# Patient Record
Sex: Female | Born: 1940 | Race: White | Hispanic: No | Smoking: Never smoker
Health system: Southern US, Community
[De-identification: ages and names within clinical notes are randomized; demographics above are authoritative.]

## PROBLEM LIST (undated history)

## (undated) DIAGNOSIS — I2510 Atherosclerotic heart disease of native coronary artery without angina pectoris: Secondary | ICD-10-CM

## (undated) DIAGNOSIS — I10 Essential (primary) hypertension: Secondary | ICD-10-CM

## (undated) DIAGNOSIS — I447 Left bundle-branch block, unspecified: Secondary | ICD-10-CM

## (undated) DIAGNOSIS — R079 Chest pain, unspecified: Secondary | ICD-10-CM

## (undated) HISTORY — PX: VARICOSE VEIN SURGERY: SHX832

## (undated) HISTORY — DX: Atherosclerotic heart disease of native coronary artery without angina pectoris: I25.10

## (undated) HISTORY — PX: CHOLECYSTECTOMY: SHX55

## (undated) HISTORY — PX: ABDOMINAL HYSTERECTOMY: SHX81

## (undated) HISTORY — DX: Chest pain, unspecified: R07.9

## (undated) HISTORY — DX: Left bundle-branch block, unspecified: I44.7

---

## 2001-04-03 ENCOUNTER — Emergency Department (HOSPITAL_COMMUNITY): Admission: EM | Admit: 2001-04-03 | Discharge: 2001-04-03 | Payer: Self-pay | Admitting: Emergency Medicine

## 2003-08-24 ENCOUNTER — Ambulatory Visit (HOSPITAL_COMMUNITY): Admission: RE | Admit: 2003-08-24 | Discharge: 2003-08-24 | Payer: Self-pay | Admitting: Family Medicine

## 2007-02-08 ENCOUNTER — Ambulatory Visit: Payer: Self-pay | Admitting: Cardiology

## 2007-02-08 LAB — CONVERTED CEMR LAB
Basophils Absolute: 0 10*3/uL (ref 0.0–0.1)
Eosinophils Absolute: 0 10*3/uL (ref 0.0–0.6)
Eosinophils Relative: 0.4 % (ref 0.0–5.0)
GFR calc Af Amer: 81 mL/min
GFR calc non Af Amer: 67 mL/min
Glucose, Bld: 101 mg/dL — ABNORMAL HIGH (ref 70–99)
HCT: 35.8 % — ABNORMAL LOW (ref 36.0–46.0)
Lymphocytes Relative: 42.7 % (ref 12.0–46.0)
MCHC: 36 g/dL (ref 30.0–36.0)
MCV: 85.6 fL (ref 78.0–100.0)
Neutro Abs: 2.4 10*3/uL (ref 1.4–7.7)
Neutrophils Relative %: 48.7 % (ref 43.0–77.0)
Platelets: 158 10*3/uL (ref 150–400)
RBC: 4.18 M/uL (ref 3.87–5.11)
Sodium: 141 meq/L (ref 135–145)
WBC: 4.8 10*3/uL (ref 4.5–10.5)
aPTT: 25.6 s (ref 21.7–29.8)

## 2007-02-11 ENCOUNTER — Inpatient Hospital Stay (HOSPITAL_BASED_OUTPATIENT_CLINIC_OR_DEPARTMENT_OTHER): Admission: RE | Admit: 2007-02-11 | Discharge: 2007-02-11 | Payer: Self-pay | Admitting: Cardiovascular Disease

## 2007-02-11 ENCOUNTER — Ambulatory Visit: Payer: Self-pay | Admitting: Cardiovascular Disease

## 2007-03-03 ENCOUNTER — Ambulatory Visit: Payer: Self-pay | Admitting: Cardiology

## 2007-03-03 ENCOUNTER — Ambulatory Visit: Payer: Self-pay

## 2007-05-05 ENCOUNTER — Ambulatory Visit: Payer: Self-pay | Admitting: Cardiology

## 2007-12-11 ENCOUNTER — Inpatient Hospital Stay (HOSPITAL_COMMUNITY): Admission: EM | Admit: 2007-12-11 | Discharge: 2007-12-13 | Payer: Self-pay | Admitting: Emergency Medicine

## 2007-12-11 ENCOUNTER — Ambulatory Visit: Payer: Self-pay | Admitting: Cardiology

## 2008-07-04 ENCOUNTER — Ambulatory Visit: Payer: Self-pay | Admitting: Cardiology

## 2008-08-20 ENCOUNTER — Ambulatory Visit: Payer: Self-pay | Admitting: Cardiology

## 2008-08-20 LAB — CONVERTED CEMR LAB
ALT: 20 units/L (ref 0–35)
AST: 18 units/L (ref 0–37)
Alkaline Phosphatase: 37 units/L — ABNORMAL LOW (ref 39–117)
Bilirubin, Direct: 0.1 mg/dL (ref 0.0–0.3)
Total Bilirubin: 1 mg/dL (ref 0.3–1.2)
Total Protein: 6.9 g/dL (ref 6.0–8.3)

## 2008-10-31 ENCOUNTER — Encounter (INDEPENDENT_AMBULATORY_CARE_PROVIDER_SITE_OTHER): Payer: Self-pay | Admitting: *Deleted

## 2008-12-24 ENCOUNTER — Encounter: Payer: Self-pay | Admitting: Cardiology

## 2009-02-25 DIAGNOSIS — E785 Hyperlipidemia, unspecified: Secondary | ICD-10-CM

## 2009-02-25 DIAGNOSIS — R609 Edema, unspecified: Secondary | ICD-10-CM

## 2009-02-25 DIAGNOSIS — I10 Essential (primary) hypertension: Secondary | ICD-10-CM | POA: Insufficient documentation

## 2009-05-04 ENCOUNTER — Encounter: Payer: Self-pay | Admitting: Cardiology

## 2009-05-05 ENCOUNTER — Encounter: Payer: Self-pay | Admitting: Cardiology

## 2009-06-18 DIAGNOSIS — I2510 Atherosclerotic heart disease of native coronary artery without angina pectoris: Secondary | ICD-10-CM

## 2009-07-18 ENCOUNTER — Ambulatory Visit: Payer: Self-pay

## 2009-07-18 ENCOUNTER — Ambulatory Visit: Payer: Self-pay | Admitting: Cardiology

## 2009-07-18 DIAGNOSIS — I447 Left bundle-branch block, unspecified: Secondary | ICD-10-CM | POA: Insufficient documentation

## 2009-07-19 ENCOUNTER — Telehealth (INDEPENDENT_AMBULATORY_CARE_PROVIDER_SITE_OTHER): Payer: Self-pay | Admitting: *Deleted

## 2010-04-21 ENCOUNTER — Emergency Department (HOSPITAL_COMMUNITY): Admission: EM | Admit: 2010-04-21 | Discharge: 2010-04-21 | Payer: Self-pay | Admitting: Emergency Medicine

## 2010-07-08 ENCOUNTER — Ambulatory Visit: Admit: 2010-07-08 | Payer: Self-pay | Admitting: Cardiology

## 2010-07-17 NOTE — Assessment & Plan Note (Signed)
Summary: rov per pt daughter call-mb  Medications Added FUROSEMIDE 40 MG TABS (FUROSEMIDE) 1 tab two times a day METOPROLOL SUCCINATE 100 MG XR24H-TAB (METOPROLOL SUCCINATE) 1/2 tab at bedtime      Allergies Added: ! CODEINE  Visit Type:  rov Primary Provider:  Dr. [name].[facility]  CC:  edema/feet..pt states she had cellulitis 2 months ago..otherwise no other complaints today.  History of Present Illness: [name] returns today for evaluation and management of her nonobstructive coronary artery disease by cardiac catheterization in 2008, normal left ventricular systolic function, and chronic lower extremity edema which she's had for a long time.  A couple weeks ago she developed cellulitis in her left lower shin. She was admitted to [facility].  She's also had an episode where she developed overnight increased swelling of her right lower extremity here recently. Dr. [name] placed her on increased Lasix. It has gradually come down. She complained particularly of pain in the back part of her calf. It is improved. There she denies hemoptysis, pleuritic chest pain, or history of DVT. 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 Her risk is obviously increased.  I have pulled her lipid profile from March of 2010. Her lipids at that time showed total cholesterol 230, HDL 32.2, LDL of 137. Liver functions were normal. Since that time she is to start on pravastatin by her primary care physician. She has had followup blood work there which I do not have a copy of period  Current Medications (verified): 1)  Benicar 40 Mg Tabs (Olmesartan Medoxomil) .Marland Kitchen.. 1 Tab Once Daily 2)  Aspirin Ec 325 Mg Tbec (Aspirin) .... Take One Tablet By Mouth Daily 3)  Furosemide 40 Mg Tabs (Furosemide) .Marland Kitchen.. 1 Tab Two Times A Day 4)  Metoprolol Succinate 100 Mg Xr24h-Tab (Metoprolol Succinate) .... 1/2 Tab At Bedtime 5)  Pravastatin Sodium 40 Mg Tabs (Pravastatin Sodium)  .Marland Kitchen.. 1 Tab Once Daily 6)  Tylenol 325 Mg Tabs (Acetaminophen) .... As Needed  Allergies (verified): 1)  ! Codeine  Past History:  Past Medical History: Last updated: 06/18/2009 CAD, NATIVE VESSEL (ICD-414.01) HYPERTENSION, UNSPECIFIED (ICD-401.9) HYPERLIPIDEMIA (ICD-272.4) LEG EDEMA (ICD-782.3)  Past Surgical History: Last updated: 07/09/2008 S/P Vein stripping LLE Hysterectomy 1976 Cholecystectomy 2004  Family History: Last updated: 07/09/2008 Family History of Coronary Artery Disease:   Social History: Last updated: 07/09/2008 Full Time Widowed  Tobacco Use - No.  Alcohol Use - no Drug Use - no  Risk Factors: Smoking Status: never (07/09/2008)  Review of Systems       negative other than history of present illness  Vital Signs:  Patient profile:   70 year old female Height:      65 inches Weight:      194 pounds BMI:     32.40 Pulse rate:   62 / minute Pulse rhythm:   irregular BP sitting:   140 / 92  (left arm) Cuff size:   large  Vitals Entered By: [name], [name] (July 18, 2009 11:29 AM)  Physical Exam  General:  obese.   Head:  normocephalic and atraumatic Eyes:  PERRLA/EOM intact; conjunctiva and lids normal. Neck:  Neck supple, no JVD. No masses, thyromegaly or abnormal cervical nodes. Lungs:  clear without rub Heart:  regular rate and rhythm, soft paradoxic split S2, no evidence of right ventricular lift. Msk:  decreased ROM.   Pulses:  diminished in the lower extremities. Extremities:  chronic scarring of both  lower legs. She has 3+ pitting edema of the right lower extremity above the sock line and 2+ of the left lower extremity.her right lower extremity is not tender I do not feel a cord Neurologic:  Alert and oriented x 3. Skin:  Intact without lesions or rashes. Psych:  Normal affect.   Problems:  Medical Problems Added: 1)  Dx of Lbbb  (ICD-426.3)  Impression & Recommendations:  Problem # 1:  CAD, NATIVE VESSEL  (ICD-414.01) Assessment Unchanged  Her updated medication list for this problem includes:    Aspirin Ec 325 Mg Tbec (Aspirin) .Marland Kitchen... Take one tablet by mouth daily    Metoprolol Succinate 100 Mg Xr24h-tab (Metoprolol succinate) .Marland Kitchen... 1/2 tab at bedtime  Problem # 2:  LEG EDEMA (ICD-782.3) Assessment: Deteriorated  Her history of increased right lower shin edema overnight recently caused concern for possibility of a DVT. We need to ultrasound her legs today.  Orders: Venous Duplex Lower Extremity (Venous Duplex Lower)  Problem # 3:  LBBB (ICD-426.3) Assessment: Unchanged  Her updated medication list for this problem includes:    Aspirin Ec 325 Mg Tbec (Aspirin) .Marland Kitchen... Take one tablet by mouth daily    Metoprolol Succinate 100 Mg Xr24h-tab (Metoprolol succinate) .Marland Kitchen... 1/2 tab at bedtime  Orders: EKG w/ Interpretation (93000)  Problem # 4:  HYPERLIPIDEMIA (ICD-272.4) She is now on pravastatin and this is being followed by primary care. Her updated medication list for this problem includes:    Pravastatin Sodium 40 Mg Tabs (Pravastatin sodium) .Marland Kitchen... 1 tab once daily  Patient Instructions: 1)  Your physician recommends that you schedule a follow-up appointment in: 6 MONTHS WITH DR Latori Beggs 2)  Your physician recommends that you continue on your current medications as directed. Please refer to the Current Medication list given to you today. 3)  Your physician has requested that you have a lower or upper extremity venous duplex.  This test is an ultrasound of the veins in the legs or arms.  It looks at venous blood flow that carries blood from the heart to the legs or arms.  Allow one hour for a Lower Venous exam.  Allow thirty minutes for an Upper Venous exam. There are no restrictions or special instructions.TODAY R/O DVT

## 2010-07-17 NOTE — Progress Notes (Signed)
  Medications Added COUMADIN 5 MG TABS (WARFARIN SODIUM) AS DIRECTED       Phone Note Outgoing Call   Call placed by: Scherrie Bateman, LPN,  July 19, 2009 1:52 PM Call placed to: Patient Summary of Call: ATTEMPTED TO CALL PT TO START COUMADIN 5 MG WITH F/U IN CC AT MOST CONVENIENT LOCATION PER DR WALL . THROMBUS NOTED IN TH E IVC AND PELVIC AREA APPEARS TO BE OLD . NOTED AT 05/04/09  ADMISSION AT [facility].N/A X1 Initial call taken by: Scherrie Bateman, LPN,  July 19, 2009 1:55 PM  Follow-up for Phone Call        N/A X1 Scherrie Bateman, LPN  July 23, 2009 8:58 AM CALL PLACED TO PT AWARE OF MED CHANGE AND COUMADIN SENT THROUGH EMR CC APPT MADE FOR MON 07/29/09 AT 9:OO AM Follow-up by: Scherrie Bateman, LPN,  July 25, 2009 8:31 AM    New/Updated Medications: COUMADIN 5 MG TABS (WARFARIN SODIUM) AS DIRECTED Prescriptions: COUMADIN 5 MG TABS (WARFARIN SODIUM) AS DIRECTED  #30 x 3   Entered by:   Scherrie Bateman, LPN   Authorized by:   Gaylord Shih, MD, [facility]   Signed by:   Scherrie Bateman, LPN on 78/29/5621   Method used:   Electronically to        [facility]  [address] (retail)       [address]       [place], [place]  [address]       Ph: [phone]       Fax: [phone]   RxID:   [id number]

## 2010-07-17 NOTE — Letter (Signed)
Summary: [facility]   [facility]   Imported By: Roderic Ovens 07/24/2009 15:26:25  _____________________________________________________________________  External Attachment:    Type:   Image     Comment:   External Document

## 2010-08-26 LAB — COMPREHENSIVE METABOLIC PANEL
AST: 21 U/L (ref 0–37)
Albumin: 3.9 g/dL (ref 3.5–5.2)
Alkaline Phosphatase: 38 U/L — ABNORMAL LOW (ref 39–117)
CO2: 25 mEq/L (ref 19–32)
Chloride: 105 mEq/L (ref 96–112)
Creatinine, Ser: 1 mg/dL (ref 0.4–1.2)
GFR calc Af Amer: >60 mL/min (ref >60)
GFR calc non Af Amer: 55 mL/min — ABNORMAL LOW (ref >60)
Potassium: 3.6 mEq/L (ref 3.5–5.1)
Total Bilirubin: 0.9 mg/dL (ref 0.3–1.2)

## 2010-08-26 LAB — DIFFERENTIAL
Basophils Absolute: 0 10*3/uL (ref 0.0–0.1)
Basophils Relative: 0 % (ref 0–1)
Eosinophils Absolute: 0 10*3/uL (ref 0.0–0.7)
Eosinophils Relative: 0 % (ref 0–5)
Lymphocytes Relative: 5 % — ABNORMAL LOW (ref 12–46)
Monocytes Absolute: 0.2 10*3/uL (ref 0.1–1.0)

## 2010-08-26 LAB — CULTURE, BLOOD (ROUTINE X 2): Culture: NO GROWTH

## 2010-08-26 LAB — URINALYSIS, ROUTINE W REFLEX MICROSCOPIC
Nitrite: NEGATIVE
Protein, ur: NEGATIVE mg/dL
Urobilinogen, UA: 0.2 mg/dL (ref 0.0–1.0)
pH: 6 (ref 5.0–8.0)

## 2010-08-26 LAB — CBC
Hemoglobin: 13 g/dL (ref 12.0–15.0)
MCH: 29.6 pg (ref 26.0–34.0)
MCV: 87 fL (ref 78.0–100.0)
RBC: 4.38 MIL/uL (ref 3.87–5.11)
WBC: 9 10*3/uL (ref 4.0–10.5)

## 2010-08-26 LAB — URINE CULTURE

## 2010-10-28 NOTE — Cardiovascular Report (Signed)
[name], [name]:  [id number]   MEDICAL RECORD NO.:  [id number]          PATIENT TYPE:  OIB   LOCATION:  1963                         FACILITY:  [facility]   PHYSICIAN:  Veverly Fells. Excell Seltzer, MD  DATE OF BIRTH:  May 18, 1941   DATE OF PROCEDURE:  02/11/2007  DATE OF DISCHARGE:                            CARDIAC CATHETERIZATION   PROCEDURES:  1. Left heart catheterization.  2. Selective coronary angiography.  3. Left ventricular angiography.   INDICATIONS:  [name] is a 70 year old woman with multiple  cardiovascular risk factors.  She has had recurrent chest pain and  progressive symptoms.  She was referred for cardiac catheterization for  further evaluation.   Risks and indications of the procedure were explained to the patient in  detail.  Informed consent was obtained.  The right groin was prepped,  draped, anesthetized with 1% lidocaine.  Using modified Seldinger  technique, a 4-French sheath was placed in the right femoral artery.  Multiple views of the right and left coronary arteries were taken using  standard 4-French Judkins preformed catheters.  An angled pigtail  catheter was inserted into the left ventricle, where pressures were  recorded, a left ventriculogram was performed, and pullback across the  aortic valve was done.  At the completion of the procedure the patient  was transferred to the recovery area in stable condition.  All catheter  exchanges were performed over a guidewire.  There are no immediate  complications.   FINDINGS:  Aortic pressure 186/81 with mean of 127, left ventricular  pressure is 185/15.   CORONARY ANGIOGRAPHY:  The left main coronary artery is widely patent  with no significant angiographic stenoses.   The LAD is a large-caliber vessel that courses down and reaches the LV  apex.  The ostium of the LAD has a 40-50% stenosis.  The remaining  portions of the vessel are angiographically normal with no significant  angiographic stenoses.  There are two diagonal branches, the first of  which is medium-sized, the second is small.   The left circumflex is large-caliber vessel.  It supplies a small first  OM and a large second OM branch that gives off twin vessels.  The AV  groove circumflex beyond the second OM is small.  There is no  significant angiographic stenosis in the left circumflex.   The right coronary artery is dominant.  It is a large-caliber vessel.  There is no angiographic stenosis throughout the proximal, mid or distal  portions of the vessel.  There is a twin PDA with the first branch being  the larger of the two.  There is no stenosis in the PDA.  There is a  posterior AV segment with one posterolateral branch, and there is no  angiographic stenosis.   Left ventricular function is normal as assessed by 30-degree RAO  ventriculography.  The LVEF was estimated at 55-60%.  There are no  regional wall motion abnormalities.  There is no mitral regurgitation.   ASSESSMENT:  1. Single-vessel coronary artery disease with nonobstructive plaque of      the left anterior descending artery  ostium.  2. Normal left circumflex.  3. Normal right coronary artery.  4. Normal left ventricular function.   I recommend ongoing medical therapy for nonobstructive CAD with aspirin  and statin therapy if indicated.      Veverly Fells. Excell Seltzer, MD  Electronically Signed     MDC/MEDQ  D:  02/11/2007  T:  02/12/2007  Job:  [id number]

## 2010-10-28 NOTE — Assessment & Plan Note (Signed)
[facility]                            CARDIOLOGY OFFICE NOTE   Brianna, Lawrence                     MRN:          [id number]  DATE:02/08/2007                            DOB:          1941-04-29    I was asked by Dr. Samuel Jester to evaluate [name], a  delightful 70 year old widowed white female with a few month history of  increasing shortness of breath, especially with exertion, as well as  chest pressure.   HISTORY OF PRESENT ILLNESS:  She is 70 years of age, widowed, and has  been having the above symptoms for the last few months.  We have  evaluated her in the past with a stress Cardiolite in 1994, which was  normal.  I also saw her for chest discomfort, which I felt was  noncardiac in February 26, 2003.  She had a stress Cardiolite at  [facility] in August, 2004 which showed an EF of 69%  with no ischemia.   She does have multiple risk factors for coronary disease, including age,  hypertension, family history, and hyperlipidemia.  She used to be on a  statin but has discontinued this.   Her past medical history is significant for no tobacco use, no alcohol  use, 3-4 cups of coffee a day.  She is intolerant of CODEINE.  She does  not have a dye allergy.   CURRENT MEDICATIONS:  1. Toprol XL 100 mg a day.  2. Lasix 40 mg a day.  3. Benicar 40 mg a day.  4. Aspirin 325 mg p.r.n.   PAST SURGICAL HISTORY:  She has had vein stripping multiple times and  skin grafting of her left lower extremity.  This was in the 60s.  She  has chronic lower extremity edema as a consequence.  She had a  hysterectomy in 1977 and an appendectomy.  She had a cholecystectomy in  1991.   Her family history is remarkable for a myocardial infarction in her  father at age 29 as well as her brother.  Also remarkable is her son who  was diabetic, who died of a heart attack last fall after coronary  intervention.   SOCIAL HISTORY:  She  is widowed and has one child.  She lost one child,  as mentioned above.  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  She retired from her  primary job at age 42.   REVIEW OF SYSTEMS:  Other than the HPI, remarkable for some chronic  arthritis, otherwise negative.  All systems reviewed and questioned.   PHYSICAL EXAMINATION:  Her blood pressure today is 166/90.  Her pulse is  52 and regular.  Her EKG shows nonspecific intraventricular conduction  delay, which is new since her last ECG.  She has some ST segment changes  in I and aVL.  She has poor R wave progression across the anterior  precordium, which is about the same.  She has a shift in her axis, now -  26 degrees.  Her height is 5 feet 5.  She weighs 200 pounds.  HEENT:  Normocephalic and atraumatic.  PERRL.  Extraocular movements are  intact.  Her eyes are swollen.  Sclerae are slightly injected.  PERRLA.  Facial asymmetry is normal.  She has a lower plate, otherwise dentition  is satisfactory.  NECK:  Supple.  Carotid upstrokes are equal bilaterally without bruits.  No JVD.  Thyroid is not enlarged.  Trachea is midline.  LUNGS:  Clear.  HEART:  A poorly appreciated PMI.  She has normal S1 and S2 without  gallop.  ABDOMEN:  Soft with good bowel sounds.  No midline bruit.  No  hepatomegaly.  EXTREMITIES:  Previous surgeries, particularly the left lower extremity.  She has a large defect where she had a skin graft placed and has  extensive varicose veins.  She has chronic brawny changes and also has  1+ pitting edema in the left lower extremity, 2+ in the right.  Her  pulses were +2/4, both dorsalis pedis and posterior tibialis.  There was  no calf tenderness.  NEURO:  Intact.  MUSCULOSKELETAL:  Chronic arthritic changes.  SKIN:  Chronic brawny changes in her legs.   ASSESSMENT:  1. Exertional chest discomfort and shortness of breath the last few      months.  She has multiple cardiac risk factors.  She had a negative      stress Myoview in  2004.  She now has an EKG with new changes, which      I have discussed with her today.  I am very concerned about      obstructive coronary disease and possible even out-of-hospital      myocardial infarction sometime in the recent past.  2. Hypertension.  3. Hyperlipidemia, not being treated with a statin.  4. Chronic lower extremity edema and history of vein stripping with a      skin graft to the left lower extremity with scar tissue.   RECOMMENDATIONS:  I had a long talk with Ms. Kristiansen.  I have recommended  a cardiac catheterization, which we will do this Friday, the 29th.  Indications, risks, potential benefits have been discussed.  This has  been arranged through Dr. Tonny Bollman. She will start taking aspirin  every day.  We will obtain pre-cath labs.  I will plan on seeing her  back after her catheterization.     Thomas C. Daleen Squibb, MD, [facility]  Electronically Signed    TCW/MedQ  DD: 02/08/2007  DT: 02/08/2007  Job #: [id number]   cc:   Samuel Jester

## 2010-10-28 NOTE — Discharge Summary (Signed)
NAMESHARLYNE, [name] NO.:  [id number]   MEDICAL RECORD NO.:  [id number]          PATIENT TYPE:  INP   LOCATION:  6735                         FACILITY:  [facility]   PHYSICIAN:  Altha Harm, MDDATE OF BIRTH:  26-Jun-1940   DATE OF ADMISSION:  12/11/2007  DATE OF DISCHARGE:  12/13/2007                               DISCHARGE SUMMARY   DISCHARGE DISPOSITION:  Home.   DISCHARGE DIAGNOSES:  1. Chest pain, noncardiac.  2. Hypertensive urgency, resolved.  3. Febrile illness, resolved.  4. Heparin antibody.   DISCHARGE MEDICATIONS:  1. Toprol XL 100 mg p.o. daily.  2. Lasix 40 mg p.o. b.i.d.  3. Benicar 40 mg p.o. daily.  4. Aspirin 325 mg p.o. daily.   CONSULTANT:  Dr. Valera Castle, cardiology.   PROCEDURE:  None.   DIAGNOSTIC STUDIES:  1. CT angiogram of the chest done on admission which shows no evidence      of acute pulmonary embolus, cardiomegaly and diffuse pulmonary      vascular congestion.  No evidence of pulmonary consolidation or      pleural effusion.  2. Chest x-ray done on admission shows low lung volumes with      cardiomegaly, vascular congestion, and bibasilar atelectasis.   CODE STATUS:  Full code.   ALLERGIES:  1. CODEINE  2. HEPARIN.   CHIEF COMPLAINT:  Chest pain.   HISTORY OF PRESENT ILLNESS:  Please see the H&P dictated by Dr.  Toniann Fail for the details of the HPI.   HOSPITAL COURSE:  1. The patient was given 1 dose of empiric antibiotics and blood      cultures obtained due to the febrile illness.  The antibiotics were      discontinued as the patient had no source of infection, and the      patient was observed.  The patient had no escalation in her white      count or any fever or any other symptoms of infection after      discontinuing the antibiotics.  2. Hypertension.  The patient was found to have an elevated blood      pressure on admission.  The patient was resumed on her      antihypertensive medication, and at  the time of discharge, her      blood pressure is well controlled with a blood pressure at time of      discharge of 117/65.  3. Chest pain.  The patient was known to nonobstructive coronary      artery disease.  In light of her chest pain.  Her cardiologist was      consulted.  The patient had negative enzymes serially, and after      evaluation by her cardiologist, he felt that the patient had      noncardiac chest pain and could be discharged on her usual      medications.  The patient's condition at time of discharge is      stable.   FOLLOW UP:  The patient is to follow up with her primary care physician  within 3-5 days and  to follow up with the cardiologist as scheduled.   DIETARY RESTRICTIONS:  The patient should be on a low-sodium, heart  healthy diet.   PHYSICAL RESTRICTIONS:  None.      Altha Harm, MD  Electronically Signed     MAM/MEDQ  D:  12/13/2007  T:  12/13/2007  Job:  [id number]

## 2010-10-28 NOTE — Assessment & Plan Note (Signed)
[facility]                            CARDIOLOGY OFFICE NOTE   LOREAL, SCHUESSLER                     MRN:          [id number]  DATE:03/03/2007                            DOB:          Jul 25, 1940    Ms. Piscopo returns today after having cardiac catheterization for  exertional chest discomfort and shortness of breath.  She has risk  factors of hypertension, hyperlipidemia, not being treated with a Statin  (was on Lipitor in the past with myalgias).  She is also plagued by  chronic lower extremity edema with a history of vein stripping and skin  grafting of both lower extremities.   Her catheterization showed nonobstructive disease with a 40-50% osteal  LAD.  She had normal left ventricular function.   Since the cath, she has had some redness in her right lower extremity  and some increased swelling in her calf.  She has already gone through 2  courses of antibiotics and her Lasix has been increased from 40 mg once  a day to 40 mg twice a day.   OTHER MEDICINES:  1. Toprol XL 100 mg a day.  2. Benicar 40 mg a day.  3. Aspirin 325 a day.   EXAM:  Her blood pressure today is 144/78, pulse is 62 and regular.  Her  weight is 200.  HEENT:  Unchanged from previous exam.  Carotid upstrokes are equal bilaterally without bruits.  Thyroid is not  enlarged.  Trachea is midline.  Lungs are clear.  HEART:  Reveals a poorly appreciated PMI.  She has a normal S1, S2  without gallop.  ABDOMEN:  Soft with good bowel sounds.  Her right cath site is stable.  There is no bruit.  There is no ecchymoses or hematoma.  Her right lower extremity shows mild redness, but no heat or tenderness.  Her right calf is more swollen than her left, but they look very  similar.  I could feel no Baker's cyst behind her right knee.  Pulses  were present, but reduced.   I had a long talk with Mrs. Rovito.  We are going to scan her right leg  to rule out any sort of major DVT.   Otherwise, continued therapy with  Dr. [name] is reasonable.   She clearly needs a Statin.  I am going to place her on pravastatin 40  mg p.o. daily with followup lipids and LFTs in 6 to 8 weeks.  Hopefully,  she can tolerate this drug since she had problems with Lipitor.     Thomas C. Daleen Squibb, MD, [facility]  Electronically Signed    TCW/MedQ  DD: 03/03/2007  DT: 03/03/2007  Job #: [id number]

## 2010-10-28 NOTE — Assessment & Plan Note (Signed)
[facility]                            CARDIOLOGY OFFICE NOTE   Brianna, Lawrence                     MRN:          [id number]  DATE:07/04/2008                            DOB:          May 21, 1941    [name] comes in today for followup.   PROBLEM LIST:  1. Nonobstructive coronary disease.  She is having no chest pain.  2. Hyperlipidemia with a low HDL.  I have given her prescriptions for      pravastatin, but she has not been able to fill it.  Apparently      drugstore made some error last time, but she has had major      intolerance and reluctance to take other statins.  She would like      to try it again however.  3. Lower extremity edema, which is chronic.   She is currently on Toprol-XL 100 mg per day, Benicar 40 mg p.o. daily,  aspirin 325 mg a day, Lasix 40 mg p.o. b.i.d.   PHYSICAL EXAMINATION:  VITAL SIGNS:  Today, her blood pressure is  140/76, her pulse is 71 and regular, weight is 194 down 5.  HEENT:  Normal.  NECK:  Supple.  Carotid upstrokes were equal bilaterally without bruits.  No JVD.  Thyroid is not enlarged.  Trachea is midline.  LUNGS:  Clear to auscultation and percussion.  HEART:  Reveals a normal S1 and S2.  No gallop.  She has a paradoxically  split S2.  ABDOMEN:  Soft.  Good bowel sounds.  No midline bruit.  EXTREMITIES:  Reveal chronic pitting edema about 2+.  Pulses were  present, but reduced.  NEUROLOGIC:  Intact.  Gait is good.  SKIN:  Unremarkable.   Her EKG shows normal sinus rhythm with a left bundle-branch block  pattern.  This is stable.   Looking back through her chart, she was admitted to the hospital on December 11, 2007.  She had chest pain, which was felt to be noncardiac.  Chest x-  ray showed some mild cardiomegaly.  She had a CT scan, which showed no  evidence of acute pulmonary embolus.   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  I think she is just becoming less able to  tolerate it.  I do not think she would tolerate a higher dose of Lasix  at the present time nor do I think it would help her tremendously.   PLAN:  1. Continue current medication.  2. We have renewed her pravastatin 40 mg at bedtime.  We have asked      her to come back for blood work in 6 weeks.  Otherwise, I will see      her back in a year.     Thomas C. Daleen Squibb, MD, [facility]  Electronically Signed    TCW/MedQ  DD: 07/04/2008  DT: 07/05/2008  Job #: [id number]   cc:   Samuel Jester

## 2010-10-28 NOTE — Assessment & Plan Note (Signed)
[facility]                            CARDIOLOGY OFFICE NOTE   [name], [name]                     MRN:          [id number]  DATE:05/05/2007                            DOB:          July 21, 1940    [name] returns today for further management of the following issues:  1. Non-obstructive coronary disease. She has had some atypical sharp      pain, which does not sound cardiac. She took a nitro and got a      severe headache. I have reinforced that this is not coronary.  2. Hyperlipidemia with a low HDL. I gave her pravastatin, but she lost      the prescription. She has been INTOLERANT OF OTHER STATINS. She is      willing to try it again.  3. Lower extremity edema, which is chronic. Last time she was here we      checked for DVT and it was negative by Doppler.   MEDICATIONS:  1. Lasix 40 mg p.o. b.i.d.  2. Aspirin 325 mg daily.  3. Benicar 40 mg a day.  4. Toprol XL 100 mg a day.   Her blood pressure today was high at 174/85, pulse 68 and regular.  Weight is 199.  HEENT: Is unchanged. Carotids are full without bruits. No JVD. There is  not enlarged. Trachea is midline.  LUNGS:  Clear.  HEART: Reveals a nondisplaced but poorly appreciated PMI. She has a  normal S1, S2.  ABDOMEN: Soft with good bowel sounds.  EXTREMITIES: Reveals chronic edema, which is actually much better. There  is no sign of DVT or any tenderness.  NEURO: Is intact.   Her most recent lipids off of the statin from Dr. Silvana Newness office showed  a total cholesterol of LDL of 139, HDL of 30, total cholesterol of 202,  triglycerides of 166.   I have asked [name] to try the pravastatin 40 mg q nightly. We would  like to check blood work with either [place] or Dr. [name] in about 6-8  weeks. We have made no other changes. I will see her back in a year.     Thomas C. Daleen Squibb, MD, [facility]  Electronically Signed    TCW/MedQ  DD: 05/05/2007  DT: 05/05/2007  Job #: [id number]   cc:   Samuel Jester

## 2010-10-28 NOTE — H&P (Signed)
NAMEMARINA, BOERNER NO.:  [id number]   MEDICAL RECORD NO.:  [id number]          PATIENT TYPE:  EMS   LOCATION:  MAJO                         FACILITY:  [facility]   PHYSICIAN:  Eduard Clos, MDDATE OF BIRTH:  Apr 30, 1941   DATE OF ADMISSION:  12/11/2007  DATE OF DISCHARGE:                              HISTORY & PHYSICAL   CHIEF COMPLAINT:  Chest pain.   HISTORY OF PRESENT ILLNESS:  A 70 year old female with known history of  hypertension, presented to the ER complaining of chest pain.  The  patient stated the chest pain started in the early morning around 1 a.m.  The pain was retrosternal and radiating to her back and her neck.  The  pain was persistent and increased with aggression.  The patient had also  noted that over the last week she had been having shortness of breath,  which was presenting at rest.  The patient had chest pain being  persistent; came to the ER for further workup.  In the ER the patient  was started on IV nitroglycerin, and subsequently the chest pain  improved.  At this time she is rating her chest pain at 2 out of 10,  which she stated was around 8-9 out of 10.  She denies any associated  nausea, vomiting, diaphoresis, dizziness, palpitations, weakness of  limbs, any headache or any abdominal pain or diarrhea.  In addition, the  patient was noticed to have a fever of 101.4.  The patient has been  admitted for further management of her chest pain and fever.   PAST MEDICAL HISTORY:  Hypertension.   PAST SURGICAL HISTORY:  1. Cholecystectomy.  2. Partial hysterectomy.  3. Hematological surgery for lower extremity small veins.   MEDICATIONS PRIOR TO ADMISSION:  1. Toprol XL 100 mg daily.  2. Lasix 40 mg twice daily.  3. Benicar 40 mg daily.  4. Aspirin 325 mg p.o. daily.   ALLERGIES:  CODEINE.   FAMILY HISTORY:  Noncontributory.   SOCIAL HISTORY:  The patient denies smoking cigarettes, drinking alcohol  or using any illegal  drugs.   REVIEW OF SYSTEMS:  As per history of presenting illness and nothing  else significant.   PHYSICAL EXAMINATION:  The patient examined at bedside; not in acute  distress.  VITAL SIGNS:  Blood pressure 144/55, pulse 70 per minute, temperature  maximum 101.4, respirations 18, oxygen saturation 98%.  HEENT:  Anicteric.  No pallor.  PERRLA.  LUNGS:  No rhonchi and clear to auscultation.  HEART:  S1 and S2 heard.  ABDOMEN:  Soft and nontender.  Bowel sounds heard.  No guarding or  rigidity.  NEUROLOGIC:  Cranial nerves II-XII intact.  Oriented to time, place and  person.  No focal deficits.  EXTREMITIES:  Peripheral pulses full.  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  No edema.   LABORATORY STUDIES:  Chest x-ray:  Low lung volumes with cardiomegaly,  vascular congestion and right basilar atelectasis.  EKG:  Sinus rhythm  with left bundle branch block; comparable to the old EKG.  CBC:  WBC  9.6, hemoglobin 14.2, hematocrit 39.4, platelets 121, neutrophils 84%.  Complete Metabolic Panel:  Sodium 137, potassium 3.7, chloride 102,  carbon dioxide 27, glucose 119, BUN 11, creatinine 1.08, total bilirubin  1, alkaline phosphatase 45, AST 25, ALT 21.  Albumin 4.2, total protein  7.1, calcium 9.7.  CK 86, MB 0.5.  Troponin I less than 0.01.   ASSESSMENT:  1. Chest Pain.  To rule out ACS.  2. Hypertension.  3. Shortness of breath with fever.   PLAN:  Will admit the patient to telemetry.  Will cycle cardiac markers.  Will place the patient on Doppler.  Will get an x-ray on H1N1 screening.  Will place the patient on empiric antibiotics, obtain blood cultures,  urine cultures.  We will get a CAT scan of the chest to rule out any  intrathoracic reasons for her shortness of breath.  Further  recommendations as patient's condition evolves.      Eduard Clos, MD  Electronically Signed     ANK/MEDQ  D:  12/11/2007  T:  12/12/2007  Job:   [id number]

## 2010-10-28 NOTE — Consult Note (Signed)
[name], Brianna NO.:  [id number]   MEDICAL RECORD NO.:  [id number]          PATIENT TYPE:  INP   LOCATION:  6735                         FACILITY:  [facility]   PHYSICIAN:  Jonelle Sidle, MD DATE OF BIRTH:  1940/07/05   DATE OF CONSULTATION:  12/12/2007  DATE OF DISCHARGE:                                 CONSULTATION   PRIMARY CARDIOLOGIST:  Dr. Valera Castle.   REQUESTING SERVICE:  [facility].   REASON FOR CONSULTATION:  Chest pain.   HISTORY OF PRESENT ILLNESS:  [name] is a pleasant 70 year old woman  with a history of hypertension, hyperlipidemia, chronic lower extremity  edema, and previously documented nonobstructive coronary  atherosclerosis.  She has a chronic left bundle branch block pattern on  electrocardiogram based on available information and has had previous  episodes of chest pain resulting in a cardiac catheterization back in  August 2008.  This study revealed a 40-50% ostial left anterior  descending stenosis with otherwise normal coronary arteries and normal  left ventricular ejection fraction of 55-60% with no regional wall  motion abnormalities.  She has been managed medically and last saw Dr.  Daleen Squibb in the office in November 2008.   [name] is now admitted to the hospital having presented  predominately with chest pain, although describes approximately 1 week  history of feeling bad, relative shortness of breath without cough and  a feeling of sharp chest pain.  She had not had any specific trigger  for the symptoms, although has noticed a gradual worsening, prompting  her presentation the hospital.  She was feeling worse on day of  admission and had one of her friends, who is reported to be a nurse,  check her blood pressure with findings of 200/115 in the setting of  having sharp chest pain and shortness of breath as well as feeling of  nausea and clamminess.  She was subsequently noted to have a low grade  fever of  101.5 degrees, although has subsequently been afebrile during  this hospital stay.  Her electrocardiogram shows sinus rhythm with a  left bundle branch block pattern which is reportedly old.  There is no  old tracing available at this time.  She was placed initially on Lovenox  and nitroglycerin and has subsequently ruled out for myocardial  infarction with serial cardiac markers, her peak troponin I being 0.01  with peak CK-MB of 0.7.  Given her transient fever, blood cultures were  sent and are pending at this time.  She had a urinalysis which was  essentially normal and a CT scan of the chest which described no  evidence of acute pulmonary embolus, cardiomegaly with diffuse pulmonary  vascular congestion, but no evidence of consolidation or effusion.  Today, she states that she feels much better and has had no chest pain.  She had no frank leukocytosis.  Of note, her platelet count has dropped  from 121 to 96 and she has been taken off of Lovenox given an abnormal  heparin-induced antibodies screen.  She has also apparently had a screen  for H1N1 sent,  which is presently pending, and she is on droplet  precautions.  She was treated initially with empiric antibiotics and  these have been discontinued as well.   ALLERGIES:  CODEINE.   MEDICATIONS AT HOME:  1. Toprol XL 1 mg p.o. daily.  2. Lasix 40 mg p.o. b.i.d.  3. Benicar 40 mg p.o. daily.  4. Aspirin 325 mg p.o. daily.  5. She has recently been on Lovenox, which was discontinued.  6. Nitroglycerin.  She remains on nitroglycerin paste at 1 inch daily.   PAST MEDICAL HISTORY:  Is discussed above.  The patient is status post  cholecystectomy, partial hysterectomy and prior lower extremity vein  surgery.  She has a history of hyperlipidemia with low HDL and has had  GENERAL INTOLERANCE TO STATIN MEDICATIONS.  She reports a chronic  problem with lower extremity edema and has had previous negative  evaluation for deep venous  thrombosis.   SOCIAL HISTORY:  The patient denies any active tobacco or alcohol use.  She states that she works for the [employer].   FAMILY HISTORY:  Reviewed and is noncontributory.  The patient denies  any sick contacts.   REVIEW OF SYSTEMS:  As outlined above.  No frank cough, myalgias,  rigors, anorexia, change in bowel or bladder habits.  She has occasional  palpitations, which has been a chronic problem. No dizziness or syncope.  No clearly exertional reproducible chest pain leading up to this event.  No hemoptysis. Reports compliance with medications.   PHYSICAL EXAMINATION:  Temperature (most recent) is 98.0 degrees, heart  rate 62 and regular, respirations 17, blood pressure 122/57, O2  saturation is 97% on 2 liters nasal cannula. Weight is 89.4 kg. This is  an overweight woman in no acute distress without active symptomatology  HEENT:  Conjunctiva and lids normal.  Oropharynx clear.  NECK:  Supple.  No elevated jugular venous pressure.  No loud bruits or  thyromegaly.  LUNGS:  Generally clear, somewhat coarse breath sounds, but no wheezing  or labored breathing.  CARDIAC:  Cardiac exam reveals a regular rate and rhythm.  No obvious S3  gallop or pathologic murmur.  No pericardial rub.  ABDOMEN:  Soft, nontender.  Bowel sounds are present.  No obvious  hepatomegaly.  EXTREMITIES:  Exhibit chronic-appearing edema.  Mild venous stasis,  trace pitting around the ankles.  Distal pulses are 2+.  SKIN:  Warm and  dry.  No rash is noted.  MUSCULOSKELETAL:  No kyphosis noted.  NEUROPSYCHIATRIC: The patient is alert and oriented x3.  Affect is  appropriate.   LABORATORY DATA:  WBCs 6.1, hemoglobin 12.1, hematocrit 35.1, platelets  121 down to 96. Original differential with 84% neutrophils, 10%  lymphocytes.  Sodium 139, potassium 3.2, chloride 102, bicarb 26,  glucose 115, BUN 10, creatinine 1.0, peak CK 108, peak CK-MB 0.7, peak  troponin I 0.01, LDL  cholesterol 96, HDL 29, TSH 1.3.  Urinalysis  cloudy, but otherwise normal. Positive screen for heparin antibody.  Blood cultures pending. I do not see any results on H1N1 screening.   IMPRESSION:  1. Recent chest pain also associated with shortness of breath, an      episode of nausea, and feeling of clamminess with findings of a      transient fever, but no obvious infection based on information so      far.  Her chest CT demonstrated no evidence of pulmonary embolus or      frank consolidation.  She  did have some vascular congestion noted      and a followup BNP level as already been ordered, pending at      present.  Electrocardiogram shows a stable left bundle branch block      pattern.  She has previously documented mild coronary      atherosclerosis involving the ostium of the left anterior      descending at catheterization last August.  Cardiac markers are      against an acute coronary syndrome and she feels symptomatically      better today.  Empiric antibiotics have been discontinued.  My      understanding is that she had an H1N1 screen obtained and is on      droplet precautions at this time.  2. Previous documented history of 40-50% stenosis involving the ostium      of the left anterior descending with otherwise normal coronary      arteries and normal ejection fraction by cardiac catheterization in      August of 2008.  3. History of hypertension.  4. Hyperlipidemia with low HDL and history of statin intolerance.  5. Thrombocytopenia, potentially heparin-induced based on screening.      Lovenox has been discontinued.  6. Hypokalemia, on Lasix as an outpatient.  She is not on potassium      supplementation regularly.   RECOMMENDATIONS:  From a cardiac perspective, would recommend a followup  2-D echocardiogram to ensure continued normal left ventricular systolic  function without any new wall motion abnormalities that might prompt  further inpatient cardiac  assessment.  Would discontinue nitroglycerin  paste and also provide a potassium supplement given her hypokalemia and  plans to continue Lasix as an outpatient.  If she continues to improve  clinically, then we will likely consider a followup outpatient Myoview  following discharge with subsequent assessment by Dr. Daleen Squibb in the  clinic.  Would followup platelet count and ensure that this stabilizes  as well as confirmatory heparin-induced antibodies testing.  Our service  will follow her with you and can make further recommendations.      Jonelle Sidle, MD  Electronically Signed     SGM/MEDQ  D:  12/12/2007  T:  12/12/2007  Job:  [id number]   cc:   Thomas C. Wall, MD, [facility]  [facility]

## 2011-03-12 LAB — CBC
HCT: 35.1 — ABNORMAL LOW
Hemoglobin: 12.1
Hemoglobin: 13.4
Hemoglobin: 14.2
MCHC: 35.5
MCHC: 36
MCV: 87.7
Platelets: 96 — ABNORMAL LOW
RBC: 4.32
RBC: 4.53
RDW: 13.2
RDW: 13.4
WBC: 5.2
WBC: 6.1
WBC: 9.6

## 2011-03-12 LAB — CULTURE, BLOOD (ROUTINE X 2): Culture: NO GROWTH

## 2011-03-12 LAB — URINALYSIS, ROUTINE W REFLEX MICROSCOPIC
Nitrite: NEGATIVE
Specific Gravity, Urine: 1.016
Urobilinogen, UA: 0.2
pH: 7.5

## 2011-03-12 LAB — CARDIAC PANEL(CRET KIN+CKTOT+MB+TROPI)
CK, MB: 0.5
CK, MB: 0.7
Relative Index: INVALID
Total CK: 108
Total CK: 83
Troponin I: <0.01

## 2011-03-12 LAB — COMPREHENSIVE METABOLIC PANEL
ALT: 15
ALT: 21
AST: 25
Albumin: 3.4 — ABNORMAL LOW
Alkaline Phosphatase: 35 — ABNORMAL LOW
Alkaline Phosphatase: 45
CO2: 27
Calcium: 9.7
Chloride: 102
GFR calc Af Amer: >60
GFR calc non Af Amer: 51 — ABNORMAL LOW
Glucose, Bld: 115 — ABNORMAL HIGH
Glucose, Bld: 119 — ABNORMAL HIGH
Potassium: 3.2 — ABNORMAL LOW
Potassium: 3.7
Sodium: 137
Sodium: 139
Total Bilirubin: 0.9
Total Protein: 6
Total Protein: 7.1

## 2011-03-12 LAB — DIFFERENTIAL
Basophils Relative: 0
Eosinophils Absolute: 0
Eosinophils Relative: 0
Lymphs Abs: 0.9
Monocytes Relative: 7
Neutrophils Relative %: 84 — ABNORMAL HIGH

## 2011-03-12 LAB — TSH: TSH: 1.358

## 2011-03-12 LAB — B-NATRIURETIC PEPTIDE (CONVERTED LAB): Pro B Natriuretic peptide (BNP): 65

## 2011-03-12 LAB — LIPID PANEL
Total CHOL/HDL Ratio: 5.8
VLDL: 44 — ABNORMAL HIGH

## 2013-02-08 ENCOUNTER — Ambulatory Visit: Payer: Self-pay | Admitting: Physician Assistant

## 2016-02-18 ENCOUNTER — Encounter (HOSPITAL_COMMUNITY): Payer: Self-pay | Admitting: *Deleted

## 2016-02-18 ENCOUNTER — Emergency Department (HOSPITAL_COMMUNITY)
Admission: EM | Admit: 2016-02-18 | Discharge: 2016-02-18 | Disposition: A | Discharge status: Home / Self Care | Payer: Medicare Other | Attending: Emergency Medicine | Admitting: Emergency Medicine

## 2016-02-18 ENCOUNTER — Emergency Department (HOSPITAL_COMMUNITY): Payer: Medicare Other

## 2016-02-18 DIAGNOSIS — I10 Essential (primary) hypertension: Secondary | ICD-10-CM | POA: Insufficient documentation

## 2016-02-18 DIAGNOSIS — R079 Chest pain, unspecified: Secondary | ICD-10-CM | POA: Diagnosis present

## 2016-02-18 DIAGNOSIS — Z79899 Other long term (current) drug therapy: Secondary | ICD-10-CM | POA: Diagnosis not present

## 2016-02-18 DIAGNOSIS — I2510 Atherosclerotic heart disease of native coronary artery without angina pectoris: Secondary | ICD-10-CM | POA: Insufficient documentation

## 2016-02-18 HISTORY — DX: Essential (primary) hypertension: I10

## 2016-02-18 LAB — I-STAT TROPONIN, ED
TROPONIN I, POC: 0 ng/mL (ref 0.00–0.08)
TROPONIN I, POC: 0.01 ng/mL (ref 0.00–0.08)

## 2016-02-18 LAB — BASIC METABOLIC PANEL
Anion gap: 8 (ref 5–15)
BUN: 14 mg/dL (ref 6–20)
CO2: 25 mmol/L (ref 22–32)
CREATININE: 0.96 mg/dL (ref 0.44–1.00)
Calcium: 9.3 mg/dL (ref 8.9–10.3)
Chloride: 106 mmol/L (ref 101–111)
GFR calc Af Amer: >60 mL/min (ref >60)
GFR, EST NON AFRICAN AMERICAN: 56 mL/min — AB (ref >60)
Glucose, Bld: 160 mg/dL — ABNORMAL HIGH (ref 65–99)
Potassium: 3.5 mmol/L (ref 3.5–5.1)
SODIUM: 139 mmol/L (ref 135–145)

## 2016-02-18 LAB — CBC
HCT: 38.5 % (ref 36.0–46.0)
Hemoglobin: 13.2 g/dL (ref 12.0–15.0)
MCH: 29.7 pg (ref 26.0–34.0)
MCHC: 34.3 g/dL (ref 30.0–36.0)
MCV: 86.5 fL (ref 78.0–100.0)
PLATELETS: 114 10*3/uL — AB (ref 150–400)
RBC: 4.45 MIL/uL (ref 3.87–5.11)
RDW: 12.9 % (ref 11.5–15.5)
WBC: 5.5 10*3/uL (ref 4.0–10.5)

## 2016-02-18 NOTE — ED Triage Notes (Signed)
Pt was seen by PCP, told he may have had a MI in the past, pt instructed to come to ED for further eval, pt reports heaviness in her chest and shortness of breath for a couple of days.

## 2016-02-18 NOTE — ED Notes (Signed)
Knott MD at bedside. 

## 2016-02-18 NOTE — ED Provider Notes (Signed)
[facility]-EMERGENCY DEPT Provider Note   CSN: [id number] Arrival date & time: 02/18/16  1700     History   Chief Complaint Chief Complaint  Patient presents with  . Chest Pain    HPI [name] is a 75 y.o. female.  The history is provided by the patient.  Chest Pain   This is a new problem. Episode onset: 1 week ago. The problem occurs daily. The problem has been gradually improving. The pain is associated with exertion. Pain location: right chest. The pain is mild. The quality of the pain is described as burning and pressure-like. The pain radiates to the right shoulder.    Past Medical History:  Diagnosis Date  . Hypertension     Patient Active Problem List   Diagnosis Date Noted  . LBBB 07/18/2009  . CAD, NATIVE VESSEL 06/18/2009  . HYPERLIPIDEMIA 02/25/2009  . HYPERTENSION, UNSPECIFIED 02/25/2009  . LEG EDEMA 02/25/2009    Past Surgical History:  Procedure Laterality Date  . ABDOMINAL HYSTERECTOMY    . CHOLECYSTECTOMY      OB History    No data available       Home Medications    Prior to Admission medications   Medication Sig Start Date End Date Taking? Authorizing Provider  furosemide (LASIX) 40 MG tablet Take 40 mg by mouth daily.   Yes Historical Provider, MD  HYDROcodone-acetaminophen (NORCO/VICODIN) 5-325 MG tablet Take 1 tablet by mouth every 6 (six) hours as needed for moderate pain.   Yes Historical Provider, MD  losartan (COZAAR) 100 MG tablet Take 100 mg by mouth daily.   Yes Historical Provider, MD  metoprolol succinate (TOPROL-XL) 100 MG 24 hr tablet Take 100 mg by mouth daily. Take with or immediately following a meal.   Yes Historical Provider, MD    Family History History reviewed. No pertinent family history.  Social History Social History  Substance Use Topics  . Smoking status: Never Smoker  . Smokeless tobacco: Never Used  . Alcohol use No     Allergies   Codeine   Review of Systems Review of Systems    Cardiovascular: Positive for chest pain.  All other systems reviewed and are negative.    Physical Exam Updated Vital Signs BP 193/84 (BP Location: Right Arm)   Pulse 63   Temp 97.5 F (36.4 C) (Oral)   Resp 16   Ht 5\' 5"  (1.651 m)   Wt 166 lb (75.3 kg)   SpO2 99%   BMI 27.62 kg/m   Physical Exam  Constitutional: She is oriented to person, place, and time. She appears well-developed and well-nourished. No distress.  HENT:  Head: Normocephalic.  Eyes: Conjunctivae are normal.  Neck: Neck supple. No tracheal deviation present.  Cardiovascular: Normal rate, regular rhythm and normal heart sounds.   Pulmonary/Chest: Effort normal and breath sounds normal. No respiratory distress. She has no wheezes. She has no rales. She exhibits no tenderness.  Abdominal: Soft. She exhibits no distension.  Neurological: She is alert and oriented to person, place, and time.  Skin: Skin is warm and dry.  Psychiatric: She has a normal mood and affect.  Vitals reviewed.    ED Treatments / Results  Labs (all labs ordered are listed, but only abnormal results are displayed) Labs Reviewed  BASIC METABOLIC PANEL - Abnormal; Notable for the following:       Result Value   Glucose, Bld 160 (*)    GFR calc non Af Amer 56 (*)  All other components within normal limits  CBC - Abnormal; Notable for the following:    Platelets 114 (*)    All other components within normal limits  I-STAT TROPOININ, ED  I-STAT TROPOININ, ED    EKG  EKG Interpretation  Date/Time:  Tuesday February 18 2016 17:05:06 EDT Ventricular Rate:  78 PR Interval:  176 QRS Duration: 148 QT Interval:  410 QTC Calculation: 467 R Axis:   -21 Text Interpretation:  Sinus rhythm with Premature atrial complexes Left bundle branch block Abnormal ECG No significant change since last tracing Confirmed by Jaquasha Carnevale MD, Jamilex Bohnsack [phone]) on 02/18/2016 10:27:02 PM       Radiology Dg Chest 2 View  Result Date: 02/18/2016 CLINICAL  DATA:  Dry cough, shortness of breath and midsternal chest pain for 3 days. Recent heart attack. History of hypertension. EXAM: CHEST  2 VIEW COMPARISON:  Chest radiograph April 21, 2010 FINDINGS: Cardiac silhouette is mildly enlarged. Mildly calcified aortic knob. Mild bronchitic changes without pleural effusion focal consolidation. No pneumothorax. Surgical clips in the included right abdomen compatible with cholecystectomy. Mild degenerative change of the thoracic spine. IMPRESSION: Mild cardiomegaly.  Mild bronchitic changes. Electronically Signed   By: Awilda Metro M.D.   On: 02/18/2016 18:33    Procedures Procedures (including critical care time)  Medications Ordered in ED Medications - No data to display   Initial Impression / Assessment and Plan / ED Course  I have reviewed the triage vital signs and the nursing notes.  Pertinent labs & imaging results that were available during my care of the patient were reviewed by me and considered in my medical decision making (see chart for details).  Clinical Course    75 y.o. female presents with Chest pain intermittently for the last week that occurs mostly with exertion and while at her job as well as with increased stress. She does have known single vessel coronary artery disease from a catheterization 5 years ago which showed 40-50% disease of the LAD. The last time she had pain was yesterday. She was seen by her primary care physician who noted her EKG to have a left bundle and she was sent here for further evaluation. EKG here is unchanged from prior Serial troponins are negative here, pain is fairly atypical for ACS and is not occurring at rest. The patient has not had pain in over 24 hours and at this time I do not feel that admission is necessary for rule out. I recommended that she follow-up closely with cardiology for reassessment as an outpatient to consider stress testing or catheterization this week.  Final Clinical  Impressions(s) / ED Diagnoses   Final diagnoses:  Chest pain, unspecified chest pain type    New Prescriptions New Prescriptions   No medications on file     Lyndal Pulley, MD 02/19/16 0101

## 2016-02-18 NOTE — ED Notes (Signed)
Patient left at this time with all belongings. Refused wheelchair 

## 2016-02-25 ENCOUNTER — Telehealth: Payer: Self-pay | Admitting: Cardiology

## 2016-02-25 NOTE — Telephone Encounter (Signed)
Received records from [facility] for appointment on 03/20/16 with Dr Jens Som.  Records given to [facility] (medical records) for Dr Ludwig Clarks appointment on 03/20/16. lp

## 2016-03-18 NOTE — Progress Notes (Signed)
   HPI: 75-year-old female for evaluation of chest pain. Cardiac catheterization in 2008 showed a 40-50% LAD but no other disease noted. Ejection fraction 55-60%. Patient seen in the emergency room on September 5 with chest pain. She ruled out. Hemoglobin normal. BUN 14 and creatinine 0.96. Chest x-ray showed bronchitic changes. Patient states that for the past 4 months she has had chest tightness. It typically occurs with vigorous activities and is relieved with rest. It does not radiate. She has associated dyspnea and diaphoresis but no nausea. The pain is not pleuritic or positional. She had a more prolonged episode approximately one month ago for 1 hour. She has had no symptoms at rest since then. She has some dyspnea on exertion but no orthopnea or PND. Chronic pedal edema.  Current Outpatient Prescriptions  Medication Sig Dispense Refill  . Cholecalciferol (D-3-5) 5000 units capsule Take 5,000 Units by mouth daily.    . furosemide (LASIX) 40 MG tablet Take 40 mg by mouth daily.    . HYDROcodone-acetaminophen (NORCO/VICODIN) 5-325 MG tablet Take 1 tablet by mouth every 6 (six) hours as needed for moderate pain.    . losartan (COZAAR) 100 MG tablet Take 100 mg by mouth daily.    . metoprolol succinate (TOPROL-XL) 100 MG 24 hr tablet Take 100 mg by mouth daily. Take with or immediately following a meal.     No current facility-administered medications for this visit.     Allergies  Allergen Reactions  . Codeine Nausea And Vomiting  . Penicillins Nausea And Vomiting     Past Medical History:  Diagnosis Date  . Chest pain   . Coronary artery disease   . Hypertension   . LBBB (left bundle branch block)     Past Surgical History:  Procedure Laterality Date  . ABDOMINAL HYSTERECTOMY    . CHOLECYSTECTOMY    . VARICOSE VEIN SURGERY      Social History   Social History  . Marital status: Widowed    Spouse name: N/A  . Number of children: 2  . Years of education: N/A    Occupational History  . Not on file.   Social History Main Topics  . Smoking status: Never Smoker  . Smokeless tobacco: Never Used  . Alcohol use No  . Drug use: No  . Sexual activity: Not on file   Other Topics Concern  . Not on file   Social History Narrative  . No narrative on file    Family History  Problem Relation Age of Onset  . Cancer Mother   . Heart attack Father     ROS: no fevers or chills, productive cough, hemoptysis, dysphasia, odynophagia, melena, hematochezia, dysuria, hematuria, rash, seizure activity, orthopnea, PND, claudication. Remaining systems are negative.  Physical Exam:   Blood pressure (!) 196/82, pulse 68, height 5' 5" (1.651 m), weight 168 lb (76.2 kg).  General:  Well developed/well nourished in NAD Skin warm/dry Patient not depressed No peripheral clubbing Back-normal HEENT-normal/normal eyelids Neck supple/normal carotid upstroke bilaterally; no bruits; no JVD; no thyromegaly chest - CTA/ normal expansion CV - RRR/normal S1 and S2; no murmurs, rubs or gallops;  PMI nondisplaced Abdomen -NT/ND, no HSM, no mass, + bowel sounds, no bruit 2+ femoral pulses, no bruits Ext-trace to 1+ ankle edema, no chords, 2+ DP Neuro-grossly nonfocal  ECG - Sinus, PACs, left bundle branch block.  A/P  1 angina-patient's symptoms are concerning. She has a strong family history of coronary disease including a son who   died of a myocardial infarction at age 63. She has a baseline left bundle branch block. Prior catheterization showed a 40-50% LAD. Continue aspirin and beta blocker. Will proceed with cardiac catheterization. The risks and benefits were discussed including myocardial infarction, CVA and death. She agrees to proceed. Hold Lasix the morning of procedure. Add Lipitor 40 mg daily.  2 hypertension-blood pressure is elevated. Add amlodipine 5 mg daily. Follow blood pressure and adjust regimen as needed.   3 abnormal electrocardiogram-patient  has a left bundle branch block which was also present in 2011.  Olga Millers, MD

## 2016-03-20 ENCOUNTER — Ambulatory Visit (INDEPENDENT_AMBULATORY_CARE_PROVIDER_SITE_OTHER): Payer: Medicare Other | Admitting: Cardiology

## 2016-03-20 ENCOUNTER — Encounter: Payer: Self-pay | Admitting: *Deleted

## 2016-03-20 ENCOUNTER — Other Ambulatory Visit: Payer: Self-pay | Admitting: Cardiology

## 2016-03-20 ENCOUNTER — Encounter: Payer: Self-pay | Admitting: Cardiology

## 2016-03-20 VITALS — BP 196/82 | HR 68 | Ht 65.0 in | Wt 168.0 lb

## 2016-03-20 DIAGNOSIS — I208 Other forms of angina pectoris: Secondary | ICD-10-CM

## 2016-03-20 DIAGNOSIS — I10 Essential (primary) hypertension: Secondary | ICD-10-CM | POA: Diagnosis not present

## 2016-03-20 DIAGNOSIS — E785 Hyperlipidemia, unspecified: Secondary | ICD-10-CM

## 2016-03-20 DIAGNOSIS — R072 Precordial pain: Secondary | ICD-10-CM

## 2016-03-20 LAB — BASIC METABOLIC PANEL
BUN: 16 mg/dL (ref 7–25)
CHLORIDE: 105 mmol/L (ref 98–110)
CO2: 30 mmol/L (ref 20–31)
Calcium: 9.5 mg/dL (ref 8.6–10.4)
Creat: 1.02 mg/dL — ABNORMAL HIGH (ref 0.60–0.93)
Glucose, Bld: 101 mg/dL — ABNORMAL HIGH (ref 65–99)
POTASSIUM: 4.4 mmol/L (ref 3.5–5.3)
Sodium: 141 mmol/L (ref 135–146)

## 2016-03-20 LAB — CBC
HCT: 38.4 % (ref 35.0–45.0)
Hemoglobin: 13.4 g/dL (ref 11.7–15.5)
MCH: 29.7 pg (ref 27.0–33.0)
MCHC: 34.9 g/dL (ref 32.0–36.0)
MCV: 85.1 fL (ref 80.0–100.0)
MPV: 11.3 fL (ref 7.5–12.5)
PLATELETS: 133 10*3/uL — AB (ref 140–400)
RBC: 4.51 MIL/uL (ref 3.80–5.10)
RDW: 13.3 % (ref 11.0–15.0)
WBC: 5.2 10*3/uL (ref 3.8–10.8)

## 2016-03-20 LAB — PROTIME-INR
INR: 1
Prothrombin Time: 11.1 s (ref 9.0–11.5)

## 2016-03-20 MED ORDER — ASPIRIN EC 81 MG PO TBEC: 81.0000 mg | DELAYED_RELEASE_TABLET | Freq: Every day | ORAL | 3 refills | Status: DC

## 2016-03-20 MED ORDER — ATORVASTATIN CALCIUM 40 MG PO TABS: 40.0000 mg | ORAL_TABLET | Freq: Every day | ORAL | 12 refills | Status: DC

## 2016-03-20 MED ORDER — AMLODIPINE BESYLATE 5 MG PO TABS: 5.0000 mg | ORAL_TABLET | Freq: Every day | ORAL | 12 refills | Status: DC

## 2016-03-20 NOTE — Patient Instructions (Signed)
Medication Instructions:   START AMLODIPINE 5 MG ONCE DAILY  START ATORVASTATIN 40 MG ONCE DAILY  Labwork:  Your physician recommends that you HAVE LAB WORK TODAY  Your physician recommends that you return for lab work in: 4 WEEKS= DO NOT EAT PRIOR TO LAB WORK  Testing/Procedures:  Your physician has requested that you have a cardiac catheterization. Cardiac catheterization is used to diagnose and/or treat various heart conditions. Doctors may recommend this procedure for a number of different reasons. The most common reason is to evaluate chest pain. Chest pain can be a symptom of coronary artery disease (CAD), and cardiac catheterization can show whether plaque is narrowing or blocking your heart's arteries. This procedure is also used to evaluate the valves, as well as measure the blood flow and oxygen levels in different parts of your heart. For further information please visit [email or web address] Please follow instruction sheet, as given.    Follow-Up:  Your physician recommends that you schedule a follow-up appointment in: 8 WEEKS WITH DR Jens Som

## 2016-03-23 ENCOUNTER — Encounter (HOSPITAL_COMMUNITY)
Admission: RE | Disposition: A | Discharge status: Home / Self Care | Payer: Self-pay | Source: Ambulatory Visit | Attending: Cardiovascular Disease

## 2016-03-23 ENCOUNTER — Ambulatory Visit (HOSPITAL_COMMUNITY)
Admission: RE | Admit: 2016-03-23 | Discharge: 2016-03-23 | Disposition: A | Discharge status: Home / Self Care | Payer: Medicare Other | Source: Ambulatory Visit | Attending: Cardiovascular Disease | Admitting: Cardiovascular Disease

## 2016-03-23 DIAGNOSIS — I208 Other forms of angina pectoris: Secondary | ICD-10-CM

## 2016-03-23 DIAGNOSIS — I2089 Other forms of angina pectoris: Secondary | ICD-10-CM

## 2016-03-23 DIAGNOSIS — Z8249 Family history of ischemic heart disease and other diseases of the circulatory system: Secondary | ICD-10-CM | POA: Diagnosis not present

## 2016-03-23 DIAGNOSIS — R072 Precordial pain: Secondary | ICD-10-CM | POA: Diagnosis not present

## 2016-03-23 DIAGNOSIS — I2510 Atherosclerotic heart disease of native coronary artery without angina pectoris: Secondary | ICD-10-CM

## 2016-03-23 DIAGNOSIS — Z880 Allergy status to penicillin: Secondary | ICD-10-CM | POA: Insufficient documentation

## 2016-03-23 DIAGNOSIS — I447 Left bundle-branch block, unspecified: Secondary | ICD-10-CM | POA: Insufficient documentation

## 2016-03-23 DIAGNOSIS — I10 Essential (primary) hypertension: Secondary | ICD-10-CM | POA: Diagnosis not present

## 2016-03-23 HISTORY — PX: CARDIAC CATHETERIZATION: SHX172

## 2016-03-23 SURGERY — LEFT HEART CATH AND CORONARY ANGIOGRAPHY
Anesthesia: LOCAL

## 2016-03-23 MED ORDER — LIDOCAINE HCL (PF) 1 % IJ SOLN
INTRAMUSCULAR | Status: DC | PRN
  Administered 2016-03-23: 2 mL

## 2016-03-23 MED ORDER — SODIUM CHLORIDE 0.9 % WEIGHT BASED INFUSION: 3.0000 mL/kg/h | INTRAVENOUS | Status: DC

## 2016-03-23 MED ORDER — MIDAZOLAM HCL 2 MG/2ML IJ SOLN
INTRAMUSCULAR | Status: AC
  Filled 2016-03-23: qty 2

## 2016-03-23 MED ORDER — HYDRALAZINE HCL 20 MG/ML IJ SOLN
10.0000 mg | INTRAMUSCULAR | Status: DC | PRN
  Administered 2016-03-23: 13:00:00 via INTRAVENOUS

## 2016-03-23 MED ORDER — FENTANYL CITRATE (PF) 100 MCG/2ML IJ SOLN
INTRAMUSCULAR | Status: DC | PRN
  Administered 2016-03-23: 25 ug via INTRAVENOUS

## 2016-03-23 MED ORDER — ASPIRIN 81 MG PO CHEW: 81.0000 mg | CHEWABLE_TABLET | ORAL | Status: DC

## 2016-03-23 MED ORDER — VERAPAMIL HCL 2.5 MG/ML IV SOLN
INTRAVENOUS | Status: AC
  Filled 2016-03-23: qty 2

## 2016-03-23 MED ORDER — SODIUM CHLORIDE 0.9% FLUSH: 3.0000 mL | INTRAVENOUS | Status: DC | PRN

## 2016-03-23 MED ORDER — SODIUM CHLORIDE 0.9% FLUSH: 3.0000 mL | Freq: Two times a day (BID) | INTRAVENOUS | Status: DC

## 2016-03-23 MED ORDER — IOPAMIDOL (ISOVUE-370) INJECTION 76%
INTRAVENOUS | Status: DC | PRN
  Administered 2016-03-23: 85 mL via INTRA_ARTERIAL

## 2016-03-23 MED ORDER — SODIUM CHLORIDE 0.9 % IV SOLN: 250.0000 mL | INTRAVENOUS | Status: DC | PRN

## 2016-03-23 MED ORDER — HEPARIN SODIUM (PORCINE) 1000 UNIT/ML IJ SOLN
INTRAMUSCULAR | Status: AC
  Filled 2016-03-23: qty 1

## 2016-03-23 MED ORDER — IOPAMIDOL (ISOVUE-370) INJECTION 76%
INTRAVENOUS | Status: AC
  Filled 2016-03-23: qty 100

## 2016-03-23 MED ORDER — LIDOCAINE HCL (PF) 1 % IJ SOLN
INTRAMUSCULAR | Status: AC
  Filled 2016-03-23: qty 30

## 2016-03-23 MED ORDER — DIAZEPAM 5 MG PO TABS: 5.0000 mg | ORAL_TABLET | Freq: Four times a day (QID) | ORAL | Status: DC | PRN

## 2016-03-23 MED ORDER — ASPIRIN 81 MG PO CHEW: 81.0000 mg | CHEWABLE_TABLET | Freq: Every day | ORAL | Status: DC

## 2016-03-23 MED ORDER — HEPARIN (PORCINE) IN NACL 2-0.9 UNIT/ML-% IJ SOLN
INTRAMUSCULAR | Status: DC | PRN
  Administered 2016-03-23: 11:00:00 via INTRA_ARTERIAL

## 2016-03-23 MED ORDER — HEPARIN (PORCINE) IN NACL 2-0.9 UNIT/ML-% IJ SOLN
INTRAMUSCULAR | Status: DC | PRN
  Administered 2016-03-23: 1500 mL

## 2016-03-23 MED ORDER — ACETAMINOPHEN 325 MG PO TABS: 650.0000 mg | ORAL_TABLET | ORAL | Status: DC | PRN

## 2016-03-23 MED ORDER — SODIUM CHLORIDE 0.9 % WEIGHT BASED INFUSION
3.0000 mL/kg/h | INTRAVENOUS | Status: DC
  Administered 2016-03-23: 3 mL/kg/h via INTRAVENOUS

## 2016-03-23 MED ORDER — HEPARIN (PORCINE) IN NACL 2-0.9 UNIT/ML-% IJ SOLN
INTRAMUSCULAR | Status: AC
  Filled 2016-03-23: qty 1000

## 2016-03-23 MED ORDER — MIDAZOLAM HCL 2 MG/2ML IJ SOLN
INTRAMUSCULAR | Status: DC | PRN
  Administered 2016-03-23 (×2): 1 mg via INTRAVENOUS

## 2016-03-23 MED ORDER — SODIUM CHLORIDE 0.9 % IV SOLN
250.0000 mL | INTRAVENOUS | Status: DC | PRN
Start: 2016-03-23 — End: 2016-03-23

## 2016-03-23 MED ORDER — HEPARIN SODIUM (PORCINE) 1000 UNIT/ML IJ SOLN
INTRAMUSCULAR | Status: DC | PRN
  Administered 2016-03-23: 4000 [IU] via INTRAVENOUS

## 2016-03-23 MED ORDER — HYDRALAZINE HCL 20 MG/ML IJ SOLN
INTRAMUSCULAR | Status: AC
  Filled 2016-03-23: qty 1

## 2016-03-23 MED ORDER — FENTANYL CITRATE (PF) 100 MCG/2ML IJ SOLN
INTRAMUSCULAR | Status: AC
  Filled 2016-03-23: qty 2

## 2016-03-23 MED ORDER — SODIUM CHLORIDE 0.9 % WEIGHT BASED INFUSION: 1.0000 mL/kg/h | INTRAVENOUS | Status: DC

## 2016-03-23 MED ORDER — ONDANSETRON HCL 4 MG/2ML IJ SOLN: 4.0000 mg | Freq: Four times a day (QID) | INTRAMUSCULAR | Status: DC | PRN

## 2016-03-23 SURGICAL SUPPLY — 13 items
CATH INFINITI 5FR ANG PIGTAIL (CATHETERS) ×2 IMPLANT
CATH OPTITORQUE TIG 4.0 5F (CATHETERS) ×2 IMPLANT
DEVICE RAD COMP TR BAND LRG (VASCULAR PRODUCTS) ×2 IMPLANT
GLIDESHEATH SLEND SS 6F .021 (SHEATH) ×2 IMPLANT
KIT HEART LEFT (KITS) ×2 IMPLANT
PACK CARDIAC CATHETERIZATION (CUSTOM PROCEDURE TRAY) ×2 IMPLANT
SHEATH PINNACLE 5F 10CM (SHEATH) IMPLANT
SYR MEDRAD MARK V 150ML (SYRINGE) ×2 IMPLANT
TRANSDUCER W/STOPCOCK (MISCELLANEOUS) ×2 IMPLANT
TUBING CIL FLEX 10 FLL-RA (TUBING) ×2 IMPLANT
WIRE EMERALD 3MM-J .035X150CM (WIRE) IMPLANT
WIRE HI TORQ VERSACORE-J 145CM (WIRE) ×2 IMPLANT
WIRE SAFE-T 1.5MM-J .035X260CM (WIRE) ×2 IMPLANT

## 2016-03-23 NOTE — Interval H&P Note (Signed)
Cath Lab Visit (complete for each Cath Lab visit)  Clinical Evaluation Leading to the Procedure:   ACS: No.  Non-ACS:    Anginal Classification: CCS II  Anti-ischemic medical therapy: Maximal Therapy (2 or more classes of medications)  Non-Invasive Test Results: No non-invasive testing performed  Prior CABG: No previous CABG      History and Physical Interval Note:  03/23/2016 11:05 AM  [name]  has presented today for surgery, with the diagnosis of cp  The various methods of treatment have been discussed with the patient and family. After consideration of risks, benefits and other options for treatment, the patient has consented to  Procedure(s): Left Heart Cath and Coronary Angiography (N/A) as a surgical intervention .  The patient's history has been reviewed, patient examined, no change in status, stable for surgery.  I have reviewed the patient's chart and labs.  Questions were answered to the patient's satisfaction.     [name]

## 2016-03-23 NOTE — H&P (View-Only) (Signed)
HPI: 75 year old female for evaluation of chest pain. Cardiac catheterization in 2008 showed a 40-50% LAD but no other disease noted. Ejection fraction 55-60%. Patient seen in the emergency room on September 5 with chest pain. She ruled out. Hemoglobin normal. BUN 14 and creatinine 0.96. Chest x-ray showed bronchitic changes. Patient states that for the past 4 months she has had chest tightness. It typically occurs with vigorous activities and is relieved with rest. It does not radiate. She has associated dyspnea and diaphoresis but no nausea. The pain is not pleuritic or positional. She had a more prolonged episode approximately one month ago for 1 hour. She has had no symptoms at rest since then. She has some dyspnea on exertion but no orthopnea or PND. Chronic pedal edema.  Current Outpatient Prescriptions  Medication Sig Dispense Refill  . Cholecalciferol (D-3-5) 5000 units capsule Take 5,000 Units by mouth daily.    . furosemide (LASIX) 40 MG tablet Take 40 mg by mouth daily.    Marland Kitchen. HYDROcodone-acetaminophen (NORCO/VICODIN) 5-325 MG tablet Take 1 tablet by mouth every 6 (six) hours as needed for moderate pain.    Marland Kitchen. losartan (COZAAR) 100 MG tablet Take 100 mg by mouth daily.    . metoprolol succinate (TOPROL-XL) 100 MG 24 hr tablet Take 100 mg by mouth daily. Take with or immediately following a meal.     No current facility-administered medications for this visit.     Allergies  Allergen Reactions  . Codeine Nausea And Vomiting  . Penicillins Nausea And Vomiting     Past Medical History:  Diagnosis Date  . Chest pain   . Coronary artery disease   . Hypertension   . LBBB (left bundle branch block)     Past Surgical History:  Procedure Laterality Date  . ABDOMINAL HYSTERECTOMY    . CHOLECYSTECTOMY    . VARICOSE VEIN SURGERY      Social History   Social History  . Marital status: Widowed    Spouse name: N/A  . Number of children: 2  . Years of education: N/A    Occupational History  . Not on file.   Social History Main Topics  . Smoking status: Never Smoker  . Smokeless tobacco: Never Used  . Alcohol use No  . Drug use: No  . Sexual activity: Not on file   Other Topics Concern  . Not on file   Social History Narrative  . No narrative on file    Family History  Problem Relation Age of Onset  . Cancer Mother   . Heart attack Father     ROS: no fevers or chills, productive cough, hemoptysis, dysphasia, odynophagia, melena, hematochezia, dysuria, hematuria, rash, seizure activity, orthopnea, PND, claudication. Remaining systems are negative.  Physical Exam:   Blood pressure (!) 196/82, pulse 68, height 5\' 5"  (1.651 m), weight 168 lb (76.2 kg).  General:  Well developed/well nourished in NAD Skin warm/dry Patient not depressed No peripheral clubbing Back-normal HEENT-normal/normal eyelids Neck supple/normal carotid upstroke bilaterally; no bruits; no JVD; no thyromegaly chest - CTA/ normal expansion CV - RRR/normal S1 and S2; no murmurs, rubs or gallops;  PMI nondisplaced Abdomen -NT/ND, no HSM, no mass, + bowel sounds, no bruit 2+ femoral pulses, no bruits Ext-trace to 1+ ankle edema, no chords, 2+ DP Neuro-grossly nonfocal  ECG - Sinus, PACs, left bundle branch block.  A/P  1 angina-patient's symptoms are concerning. She has a strong family history of coronary disease including a son who  died of a myocardial infarction at age 63. She has a baseline left bundle branch block. Prior catheterization showed a 40-50% LAD. Continue aspirin and beta blocker. Will proceed with cardiac catheterization. The risks and benefits were discussed including myocardial infarction, CVA and death. She agrees to proceed. Hold Lasix the morning of procedure. Add Lipitor 40 mg daily.  2 hypertension-blood pressure is elevated. Add amlodipine 5 mg daily. Follow blood pressure and adjust regimen as needed.   3 abnormal electrocardiogram-patient  has a left bundle branch block which was also present in 2011.  Olga Millers, MD

## 2016-03-23 NOTE — Discharge Instructions (Signed)
Radial Site Care °Refer to this sheet in the next few weeks. These instructions provide you with information about caring for yourself after your procedure. Your health care provider may also give you more specific instructions. Your treatment has been planned according to current medical practices, but problems sometimes occur. Call your health care provider if you have any problems or questions after your procedure. °WHAT TO EXPECT AFTER THE PROCEDURE °After your procedure, it is typical to have the following: °· Bruising at the radial site that usually fades within 1-2 weeks. °· Blood collecting in the tissue (hematoma) that may be painful to the touch. It should usually decrease in size and tenderness within 1-2 weeks. °HOME CARE INSTRUCTIONS °· Take medicines only as directed by your health care provider. °· You may shower 24-48 hours after the procedure or as directed by your health care provider. Remove the bandage (dressing) and gently wash the site with plain soap and water. Pat the area dry with a clean towel. Do not rub the site, because this may cause bleeding. °· Do not take baths, swim, or use a hot tub until your health care provider approves. °· Check your insertion site every day for redness, swelling, or drainage. °· Do not apply powder or lotion to the site. °· Do not flex or bend the affected arm for 24 hours or as directed by your health care provider. °· Do not push or pull heavy objects with the affected arm for 24 hours or as directed by your health care provider. °· Do not lift over 10 lb (4.5 kg) for 5 days after your procedure or as directed by your health care provider. °· Ask your health care provider when it is okay to: °¨ Return to work or school. °¨ Resume usual physical activities or sports. °¨ Resume sexual activity. °· Do not drive home if you are discharged the same day as the procedure. Have someone else drive you. °· You may drive 24 hours after the procedure unless otherwise  instructed by your health care provider. °· Do not operate machinery or power tools for 24 hours after the procedure. °· If your procedure was done as an outpatient procedure, which means that you went home the same day as your procedure, a responsible adult should be with you for the first 24 hours after you arrive home. °· Keep all follow-up visits as directed by your health care provider. This is important. °SEEK MEDICAL CARE IF: °· You have a fever. °· You have chills. °· You have increased bleeding from the radial site. Hold pressure on the site. °SEEK IMMEDIATE MEDICAL CARE IF: °· You have unusual pain at the radial site. °· You have redness, warmth, or swelling at the radial site. °· You have drainage (other than a small amount of blood on the dressing) from the radial site. °· The radial site is bleeding, and the bleeding does not stop after 30 minutes of holding steady pressure on the site. °· Your arm or hand becomes pale, cool, tingly, or numb. °  °This information is not intended to replace advice given to you by your health care provider. Make sure you discuss any questions you have with your health care provider. °  °Document Released: 07/04/2010 Document Revised: 06/22/2014 Document Reviewed: 12/18/2013 °Elsevier Interactive Patient Education ©2016 [employer] ° °

## 2016-03-24 ENCOUNTER — Encounter (HOSPITAL_COMMUNITY): Payer: Self-pay | Admitting: Cardiovascular Disease

## 2016-05-13 NOTE — Progress Notes (Signed)
HPI: FU CAD. Cardiac catheterization October 2017 showed mild LV dysfunction, ejection fraction 45%, 40-50% ostial and proximal LAD with normal left circumflex and right coronary artery. Medical therapy recommended. Since last seen, the patient denies any dyspnea on exertion, orthopnea, PND, palpitations, syncope or chest pain.   Current Outpatient Prescriptions  Medication Sig Dispense Refill  . acetaminophen (TYLENOL) 500 MG tablet Take 500 mg by mouth every 6 (six) hours as needed for mild pain.    Marland Kitchen. amLODipine (NORVASC) 5 MG tablet Take 1 tablet (5 mg total) by mouth daily. 30 tablet 12  . aspirin EC 81 MG tablet Take 1 tablet (81 mg total) by mouth daily. 90 tablet 3  . atorvastatin (LIPITOR) 40 MG tablet Take 1 tablet (40 mg total) by mouth daily. 30 tablet 12  . Cholecalciferol (D-3-5) 5000 units capsule Take 5,000 Units by mouth daily.    . furosemide (LASIX) 40 MG tablet Take 40 mg by mouth daily.    Marland Kitchen. HYDROcodone-acetaminophen (NORCO/VICODIN) 5-325 MG tablet Take 1 tablet by mouth every 6 (six) hours as needed for moderate pain.    Marland Kitchen. losartan (COZAAR) 100 MG tablet Take 100 mg by mouth daily.    . metoprolol succinate (TOPROL-XL) 100 MG 24 hr tablet Take 100 mg by mouth daily. Take with or immediately following a meal.     No current facility-administered medications for this visit.      Past Medical History:  Diagnosis Date  . Chest pain   . Coronary artery disease   . Hypertension   . LBBB (left bundle branch block)     Past Surgical History:  Procedure Laterality Date  . ABDOMINAL HYSTERECTOMY    . CARDIAC CATHETERIZATION N/A 03/23/2016   Procedure: Left Heart Cath and Coronary Angiography;  Surgeon: Lennette Biharihomas A Kelly, MD;  [facility];  Service: Cardiovascular;  Laterality: N/A;  . CHOLECYSTECTOMY    . VARICOSE VEIN SURGERY      Social History   Social History  . Marital status: Widowed    Spouse name: N/A  . Number of children: 2  .  Years of education: N/A   Occupational History  . Not on file.   Social History Main Topics  . Smoking status: Never Smoker  . Smokeless tobacco: Never Used  . Alcohol use No  . Drug use: No  . Sexual activity: Not on file   Other Topics Concern  . Not on file   Social History Narrative  . No narrative on file    Family History  Problem Relation Age of Onset  . Cancer Mother   . Heart attack Father     ROS: no fevers or chills, productive cough, hemoptysis, dysphasia, odynophagia, melena, hematochezia, dysuria, hematuria, rash, seizure activity, orthopnea, PND, pedal edema, claudication. Remaining systems are negative.  Physical Exam: Well-developed well-nourished in no acute distress.  Skin is warm and dry.  HEENT is normal.  Neck is supple.  Chest is clear to auscultation with normal expansion.  Cardiovascular exam is regular rate and rhythm.  Abdominal exam nontender or distended. No masses palpated. Extremities show 1+ edema. neuro grossly intact  A/P  1 Coronary artery disease-moderate coronary disease. Plan medical therapy. Continue aspirin and statin.   2 cardiomyopathy-LV function mildly reduced. Continue ARB and beta blocker. Question hypertensive mediated. We will plan repeat echocardiograms in the future.   3 hypertension-blood pressure elevated. Increase amlodipine to 10 mg daily.   4 hyperlipidemia-continue statin. Check  lipids and liver.   Olga Millers, MD

## 2016-05-18 ENCOUNTER — Ambulatory Visit (INDEPENDENT_AMBULATORY_CARE_PROVIDER_SITE_OTHER): Payer: Medicare Other | Admitting: Cardiology

## 2016-05-18 ENCOUNTER — Encounter: Payer: Self-pay | Admitting: Cardiology

## 2016-05-18 VITALS — BP 180/78 | HR 79 | Ht 65.0 in | Wt 169.0 lb

## 2016-05-18 DIAGNOSIS — I10 Essential (primary) hypertension: Secondary | ICD-10-CM | POA: Diagnosis not present

## 2016-05-18 DIAGNOSIS — I2510 Atherosclerotic heart disease of native coronary artery without angina pectoris: Secondary | ICD-10-CM

## 2016-05-18 DIAGNOSIS — E7800 Pure hypercholesterolemia, unspecified: Secondary | ICD-10-CM | POA: Diagnosis not present

## 2016-05-18 MED ORDER — AMLODIPINE BESYLATE 10 MG PO TABS: 10.0000 mg | ORAL_TABLET | Freq: Every day | ORAL | 3 refills | Status: DC

## 2016-05-18 NOTE — Patient Instructions (Signed)
Medication Instructions:   INCREASE AMLODIPINE TO 10 MG ONCE DAILY= 2 OF THE 5 MG TABLETS ONCE DAILY  Labwork:  Your physician recommends that you return for lab work WHEN FASTING  Follow-Up:  Your physician wants you to follow-up in: 6 MONTHS WITH DR Jens Som You will receive a reminder letter in the mail two months in advance. If you don't receive a letter, please call our office to schedule the follow-up appointment.   If you need a refill on your cardiac medications before your next appointment, please call your pharmacy.

## 2016-07-15 ENCOUNTER — Encounter: Payer: Self-pay | Admitting: *Deleted

## 2016-07-15 ENCOUNTER — Other Ambulatory Visit: Payer: Self-pay | Admitting: *Deleted

## 2016-07-15 DIAGNOSIS — E7800 Pure hypercholesterolemia, unspecified: Secondary | ICD-10-CM

## 2016-08-06 ENCOUNTER — Encounter: Payer: Self-pay | Admitting: *Deleted

## 2016-09-14 ENCOUNTER — Telehealth: Payer: Self-pay | Admitting: *Deleted

## 2016-09-14 NOTE — Telephone Encounter (Signed)
Will fax this note to the number provided. 

## 2016-09-14 NOTE — Telephone Encounter (Signed)
Patient is needing medical clearance for cataract extraction on 10-05-16. Will forward for dr Jens Som review

## 2016-09-14 NOTE — Telephone Encounter (Signed)
Ok for surgery [name]  

## 2016-12-28 ENCOUNTER — Ambulatory Visit: Payer: Medicare Other | Admitting: Cardiology

## 2016-12-29 ENCOUNTER — Ambulatory Visit: Payer: Medicare Other | Admitting: Cardiology

## 2016-12-29 NOTE — Progress Notes (Deleted)
      HPI: FU CAD. Cardiac catheterization October 2017 showed mild LV dysfunction, ejection fraction 45%, 40-50% ostial and proximal LAD with normal left circumflex and right coronary artery. Medical therapy recommended. Since last seen,   Current Outpatient Prescriptions  Medication Sig Dispense Refill  . acetaminophen (TYLENOL) 500 MG tablet Take 500 mg by mouth every 6 (six) hours as needed for mild pain.    Marland Kitchen amLODipine (NORVASC) 10 MG tablet Take 1 tablet (10 mg total) by mouth daily. 90 tablet 3  . aspirin EC 81 MG tablet Take 1 tablet (81 mg total) by mouth daily. 90 tablet 3  . atorvastatin (LIPITOR) 40 MG tablet Take 1 tablet (40 mg total) by mouth daily. 30 tablet 12  . Cholecalciferol (D-3-5) 5000 units capsule Take 5,000 Units by mouth daily.    . furosemide (LASIX) 40 MG tablet Take 40 mg by mouth daily.    Marland Kitchen HYDROcodone-acetaminophen (NORCO/VICODIN) 5-325 MG tablet Take 1 tablet by mouth every 6 (six) hours as needed for moderate pain.    Marland Kitchen losartan (COZAAR) 100 MG tablet Take 100 mg by mouth daily.    . metoprolol succinate (TOPROL-XL) 100 MG 24 hr tablet Take 100 mg by mouth daily. Take with or immediately following a meal.     No current facility-administered medications for this visit.      Past Medical History:  Diagnosis Date  . Chest pain   . Coronary artery disease   . Hypertension   . LBBB (left bundle branch block)     Past Surgical History:  Procedure Laterality Date  . ABDOMINAL HYSTERECTOMY    . CARDIAC CATHETERIZATION N/A 03/23/2016   Procedure: Left Heart Cath and Coronary Angiography;  Surgeon: Lennette Bihari, MD;  Location: [facility] INVASIVE CV LAB;  Service: Cardiovascular;  Laterality: N/A;  . CHOLECYSTECTOMY    . VARICOSE VEIN SURGERY      Social History   Social History  . Marital status: Widowed    Spouse name: N/A  . Number of children: 2  . Years of education: N/A   Occupational History  . Not on file.   Social History Main Topics  .  Smoking status: Never Smoker  . Smokeless tobacco: Never Used  . Alcohol use No  . Drug use: No  . Sexual activity: Not on file   Other Topics Concern  . Not on file   Social History Narrative  . No narrative on file    Family History  Problem Relation Age of Onset  . Cancer Mother   . Heart attack Father     ROS: no fevers or chills, productive cough, hemoptysis, dysphasia, odynophagia, melena, hematochezia, dysuria, hematuria, rash, seizure activity, orthopnea, PND, pedal edema, claudication. Remaining systems are negative.  Physical Exam: Well-developed well-nourished in no acute distress.  Skin is warm and dry.  HEENT is normal.  Neck is supple.  Chest is clear to auscultation with normal expansion.  Cardiovascular exam is regular rate and rhythm.  Abdominal exam nontender or distended. No masses palpated. Extremities show no edema. neuro grossly intact  ECG- personally reviewed  A/P  1 Coronary artery disease-continue aspirin and statin. Patient is not having chest pain or significant dyspnea.  2 cardiomyopathy-LV function mildly reduced from previous evaluation. Continue ARB and beta blocker. Possibly hypertensive mediated.  3 hypertension-blood pressure is controlled. Continue present medications.  4 hyperlipidemia-continue statin.  Olga Millers, MD

## 2017-02-26 ENCOUNTER — Encounter: Payer: Self-pay | Admitting: Cardiology

## 2017-02-26 NOTE — Progress Notes (Signed)
HPI: FU CAD. Cardiac catheterization October 2017 showed mild LV dysfunction, ejection fraction 45%, 40-50% ostial and proximal LAD with normal left circumflex and right coronary artery. Medical therapy recommended. Since last seen, patient denies dyspnea, chest pain or syncope. She does have chronic pedal edema which is worse recently. She describes occasional fatigue.  Current Outpatient Prescriptions  Medication Sig Dispense Refill  . acetaminophen (TYLENOL) 500 MG tablet Take 500 mg by mouth every 6 (six) hours as needed for mild pain.    Marland Kitchen aspirin EC 81 MG tablet Take 1 tablet (81 mg total) by mouth daily. 90 tablet 3  . Cholecalciferol (D-3-5) 5000 units capsule Take 5,000 Units by mouth daily.    . furosemide (LASIX) 40 MG tablet Take 40 mg by mouth daily.    Marland Kitchen HYDROcodone-acetaminophen (NORCO/VICODIN) 5-325 MG tablet Take 1 tablet by mouth every 6 (six) hours as needed for moderate pain.    Marland Kitchen losartan (COZAAR) 100 MG tablet Take 100 mg by mouth daily.    . metoprolol succinate (TOPROL-XL) 100 MG 24 hr tablet Take 100 mg by mouth daily. Take with or immediately following a meal.    . amLODipine (NORVASC) 10 MG tablet Take 1 tablet (10 mg total) by mouth daily. 90 tablet 3  . atorvastatin (LIPITOR) 40 MG tablet Take 1 tablet (40 mg total) by mouth daily. 30 tablet 12   No current facility-administered medications for this visit.      Past Medical History:  Diagnosis Date  . Chest pain   . Coronary artery disease   . Hypertension   . LBBB (left bundle branch block)     Past Surgical History:  Procedure Laterality Date  . ABDOMINAL HYSTERECTOMY    . CARDIAC CATHETERIZATION N/A 03/23/2016   Procedure: Left Heart Cath and Coronary Angiography;  Surgeon: Lennette Bihari, MD;  Location: [facility] INVASIVE CV LAB;  Service: Cardiovascular;  Laterality: N/A;  . CHOLECYSTECTOMY    . VARICOSE VEIN SURGERY      Social History   Social History  . Marital status: Widowed    Spouse  name: N/A  . Number of children: 2  . Years of education: N/A   Occupational History  . Not on file.   Social History Main Topics  . Smoking status: Never Smoker  . Smokeless tobacco: Never Used  . Alcohol use No  . Drug use: No  . Sexual activity: Not on file   Other Topics Concern  . Not on file   Social History Narrative  . No narrative on file    Family History  Problem Relation Age of Onset  . Cancer Mother   . Heart attack Father     ROS: fatigue but no fevers or chills, productive cough, hemoptysis, dysphasia, odynophagia, melena, hematochezia, dysuria, hematuria, rash, seizure activity, orthopnea, PND, claudication. Remaining systems are negative.  Physical Exam: Well-developed well-nourished in no acute distress.  Skin is warm and dry.  HEENT is normal.  Neck is supple.  Chest is clear to auscultation with normal expansion.  Cardiovascular exam is regular rate and rhythm.  Abdominal exam nontender or distended. No masses palpated. Extremities show chronic skin changes and 3+ edema neuro grossly intact  ECG- Sinus rhythm at a rate of 68. Left bundle branch block. personally reviewed  A/P  1 coronary artery disease-patient remains asymptomatic. Continue aspirin and statin.  2 hypertension-blood pressure is mildly elevated. Patient also describes lower extremity edema which is chronic but worse. She  has had surgeries on both of her extremities in the past and vein stripping and this is likely secondary to venous insufficiency. I wonder if amlodipine could be contributing. I will discontinue amlodipine and instead treat with clonidine 0.1 mg twice a day. Follow blood pressure and adjust regimen as needed. Check potassium and renal function.   3 hyperlipidemia-continue statin. Check lipids and liver.  4 cardiomyopathy-LV function mildly reduced from previous catheterization. Continue beta blocker and ARB. I will plan to repeat echocardiogram to reassess LV  function.  5 lower extremity edema-worse recently. Discontinue amlodipine to see if this is contributing. Likely secondary to venous insufficiency. Continue lower extremity elevation and compression hose. Continue Lasix. Check potassium and renal function.  Olga Millers, MD

## 2017-03-11 ENCOUNTER — Ambulatory Visit (INDEPENDENT_AMBULATORY_CARE_PROVIDER_SITE_OTHER): Payer: Medicare Other | Admitting: Cardiology

## 2017-03-11 ENCOUNTER — Encounter: Payer: Self-pay | Admitting: Cardiology

## 2017-03-11 VITALS — BP 150/76 | HR 68 | Ht 65.0 in | Wt 169.0 lb

## 2017-03-11 DIAGNOSIS — I10 Essential (primary) hypertension: Secondary | ICD-10-CM | POA: Diagnosis not present

## 2017-03-11 DIAGNOSIS — E7800 Pure hypercholesterolemia, unspecified: Secondary | ICD-10-CM

## 2017-03-11 DIAGNOSIS — I2510 Atherosclerotic heart disease of native coronary artery without angina pectoris: Secondary | ICD-10-CM | POA: Diagnosis not present

## 2017-03-11 DIAGNOSIS — I428 Other cardiomyopathies: Secondary | ICD-10-CM | POA: Diagnosis not present

## 2017-03-11 MED ORDER — CLONIDINE HCL 0.1 MG PO TABS: 0.1000 mg | ORAL_TABLET | Freq: Two times a day (BID) | ORAL | 3 refills | Status: DC

## 2017-03-11 NOTE — Patient Instructions (Signed)
Medication Instructions:   STOP AMLODIPINE  START CLONIDINE 0.1 MG ONE TABLET TWICE DAILY  Labwork:  Your physician recommends that you return FOR LAB WORK WHEN FASTING  Testing/Procedures:  Your physician has requested that you have an echocardiogram. Echocardiography is a painless test that uses sound waves to create images of your heart. It provides your doctor with information about the size and shape of your heart and how well your heart's chambers and valves are working. This procedure takes approximately one hour. There are no restrictions for this procedure.    Follow-Up:  Your physician wants you to follow-up in: ONE YEAR WITH DR Shelda Pal will receive a reminder letter in the mail two months in advance. If you don't receive a letter, please call our office to schedule the follow-up appointment.   If you need a refill on your cardiac medications before your next appointment, please call your pharmacy.

## 2017-03-25 ENCOUNTER — Ambulatory Visit (HOSPITAL_COMMUNITY): Payer: Medicare Other | Attending: Cardiovascular Disease

## 2017-03-25 DIAGNOSIS — I25119 Atherosclerotic heart disease of native coronary artery with unspecified angina pectoris: Secondary | ICD-10-CM | POA: Insufficient documentation

## 2017-03-25 DIAGNOSIS — I428 Other cardiomyopathies: Secondary | ICD-10-CM | POA: Diagnosis not present

## 2017-03-25 DIAGNOSIS — I10 Essential (primary) hypertension: Secondary | ICD-10-CM | POA: Insufficient documentation

## 2017-03-25 DIAGNOSIS — I071 Rheumatic tricuspid insufficiency: Secondary | ICD-10-CM | POA: Insufficient documentation

## 2017-03-25 DIAGNOSIS — I447 Left bundle-branch block, unspecified: Secondary | ICD-10-CM | POA: Diagnosis not present

## 2017-04-01 IMAGING — DX DG CHEST 2V
2 series · 2 of 2 positions shown · non-contrast
Comparison: Chest radiograph April 21, 2010

CLINICAL DATA: Dry cough, shortness of breath and midsternal chest
pain for 3 days. Recent heart attack. History of hypertension.

EXAM:
CHEST  2 VIEW

[chest pa]
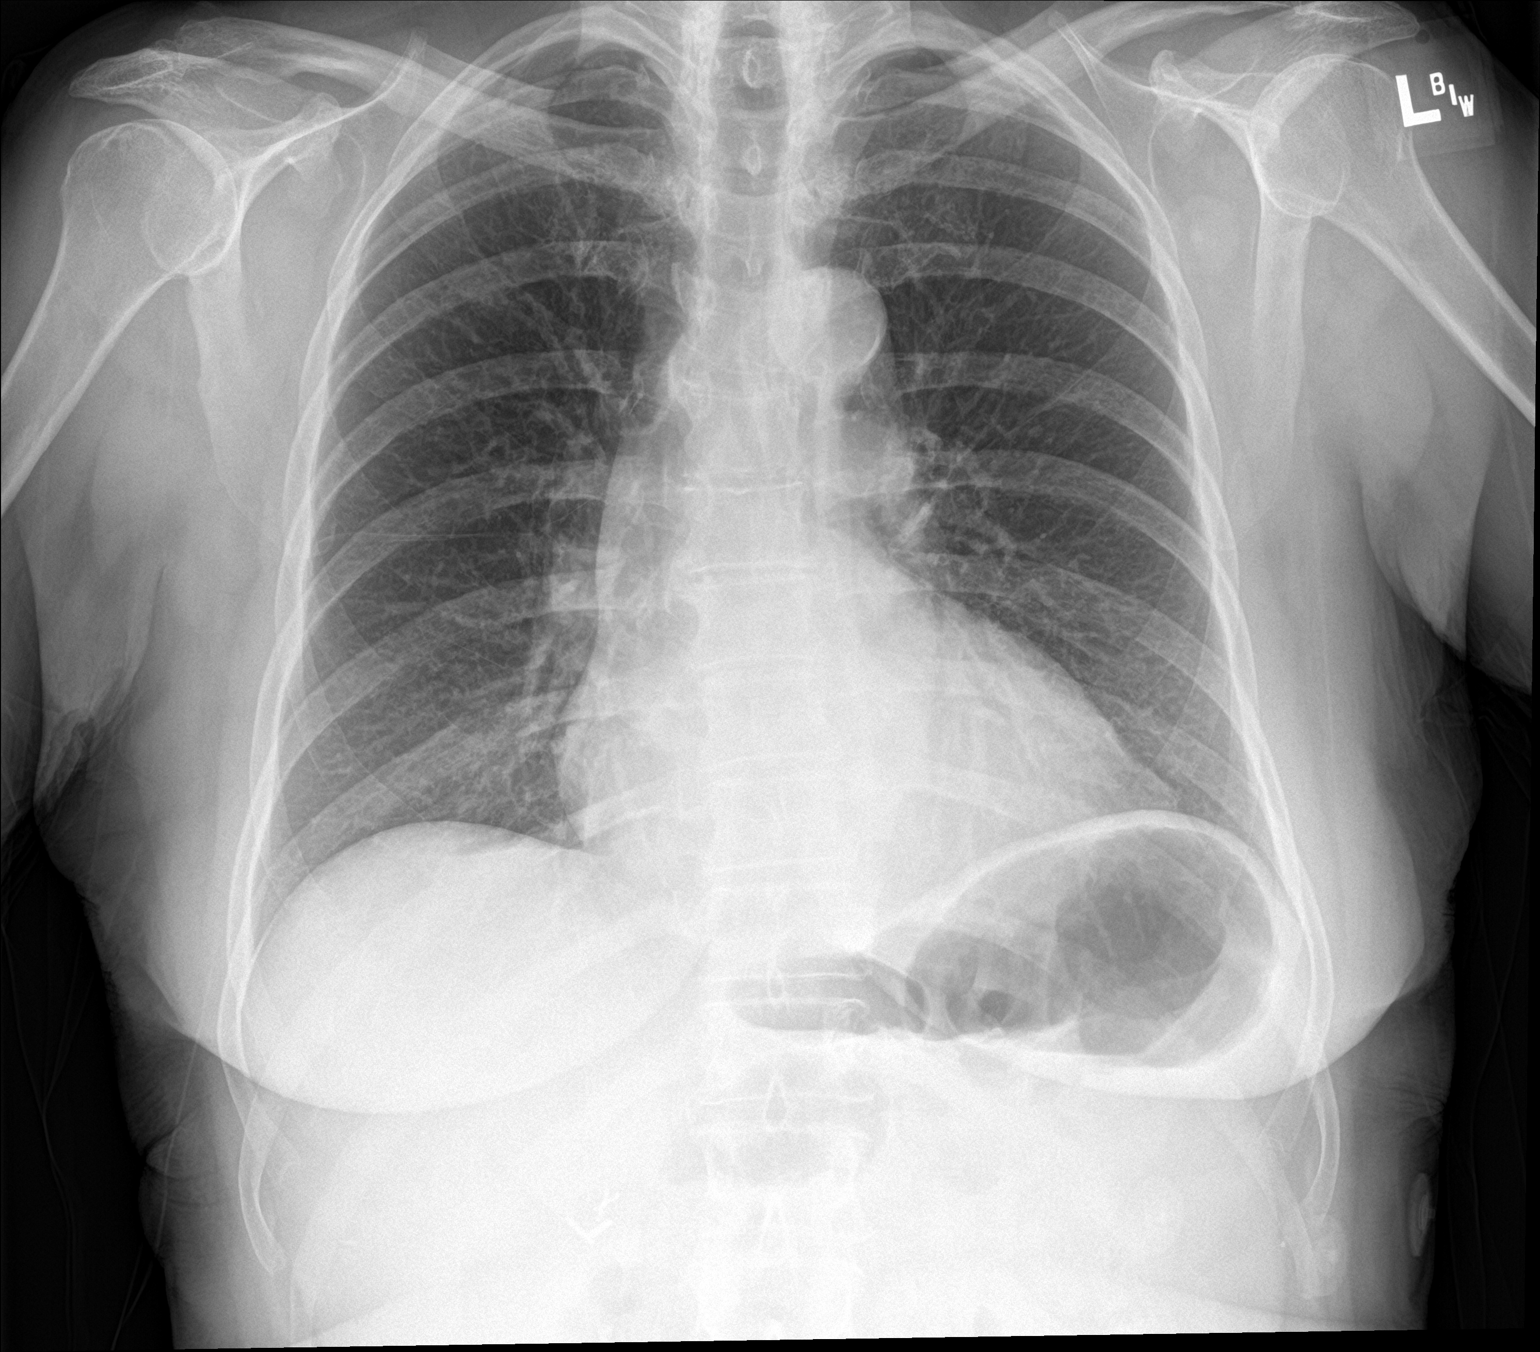

[chest lat]
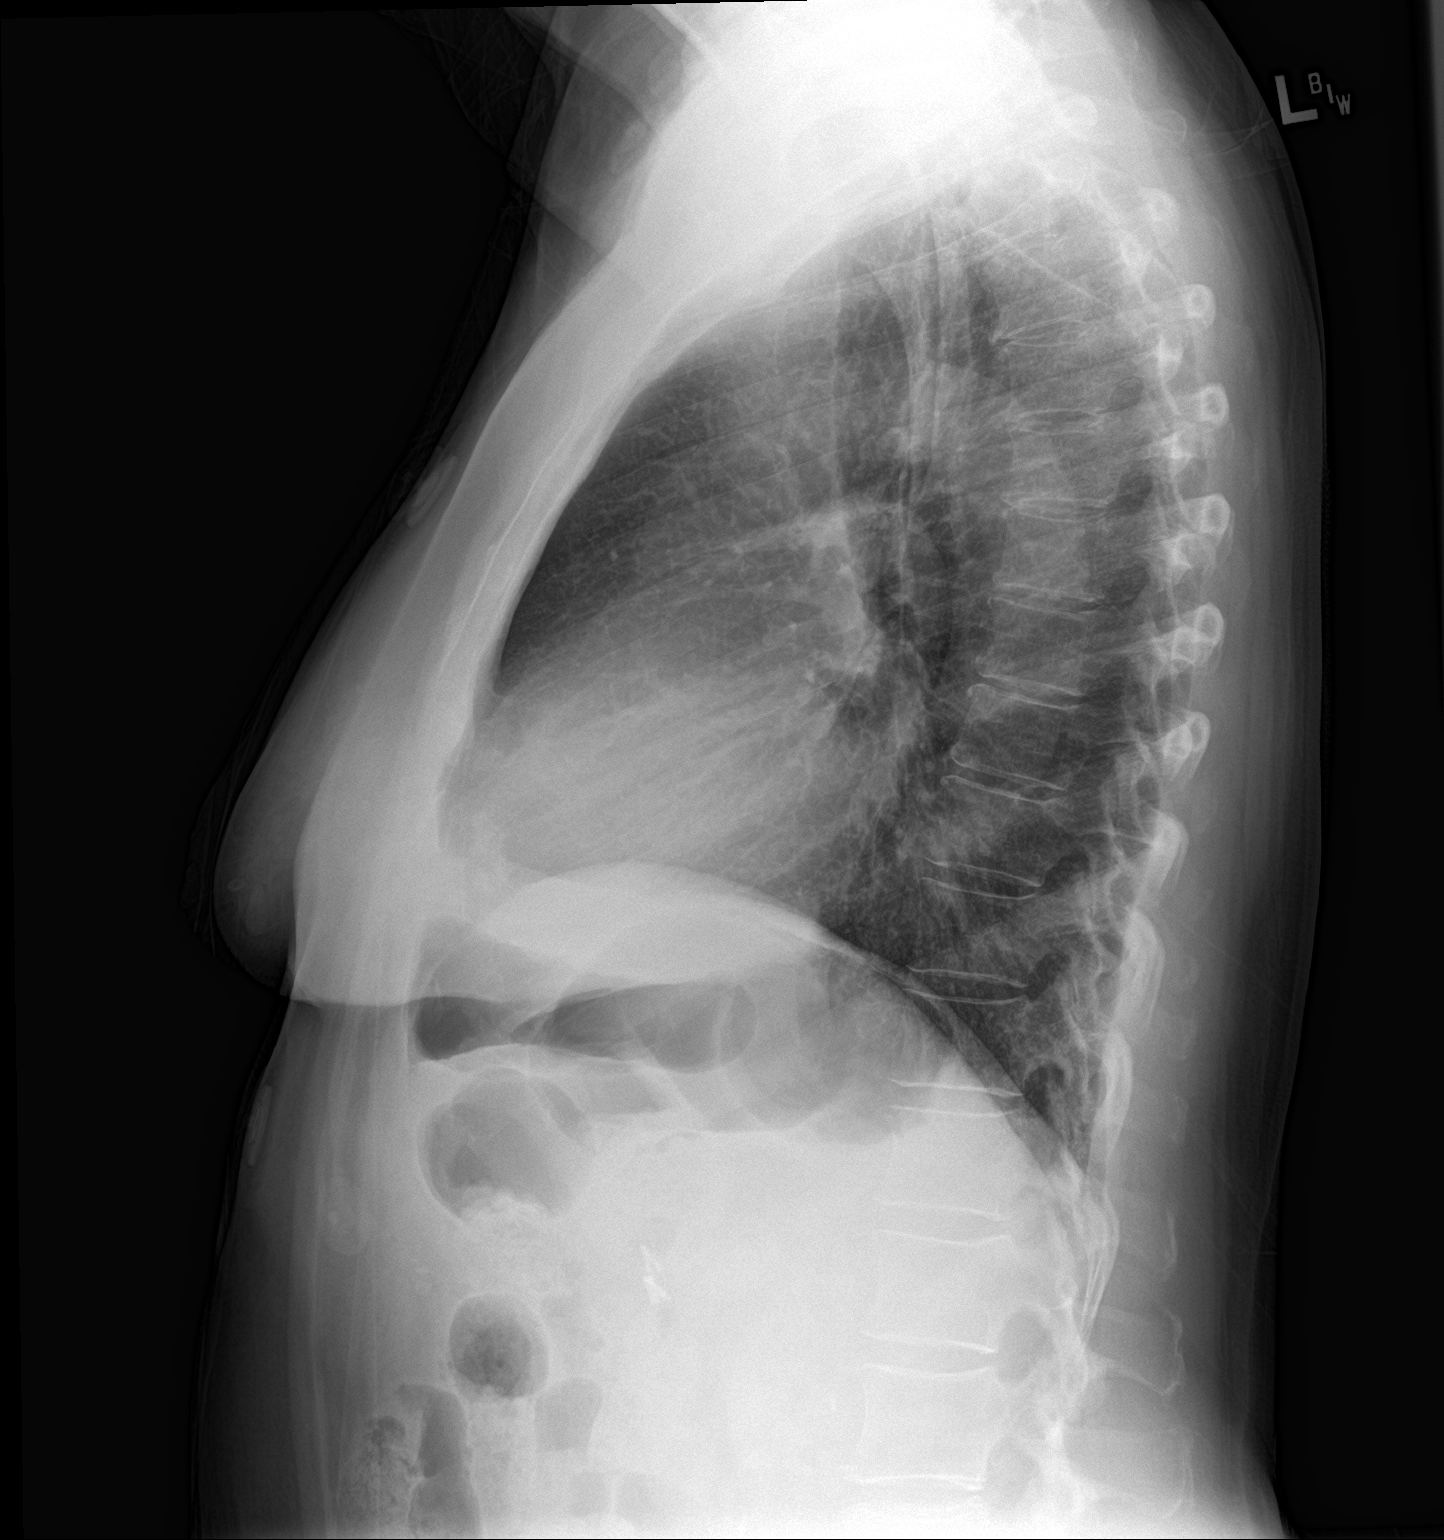

[2 of 2 positions shown; findings below may reference images not displayed]

FINDINGS: Cardiac silhouette is mildly enlarged. Mildly calcified aortic knob.
Mild bronchitic changes without pleural effusion focal
consolidation. No pneumothorax. Surgical clips in the included right
abdomen compatible with cholecystectomy. Mild degenerative change of
the thoracic spine.
IMPRESSION: Mild cardiomegaly.  Mild bronchitic changes.

## 2017-04-05 ENCOUNTER — Encounter: Payer: Self-pay | Admitting: *Deleted

## 2017-05-11 ENCOUNTER — Other Ambulatory Visit: Payer: Self-pay

## 2017-05-11 DIAGNOSIS — I998 Other disorder of circulatory system: Secondary | ICD-10-CM

## 2017-05-31 ENCOUNTER — Other Ambulatory Visit: Payer: Self-pay

## 2017-05-31 ENCOUNTER — Ambulatory Visit (HOSPITAL_COMMUNITY)
Admission: RE | Admit: 2017-05-31 | Discharge: 2017-05-31 | Disposition: A | Discharge status: Home / Self Care | Payer: Medicare Other | Source: Ambulatory Visit | Attending: Vascular Surgery | Admitting: Vascular Surgery

## 2017-05-31 ENCOUNTER — Ambulatory Visit: Payer: Medicare Other | Admitting: Vascular Surgery

## 2017-05-31 ENCOUNTER — Encounter: Payer: Self-pay | Admitting: Vascular Surgery

## 2017-05-31 VITALS — BP 170/72 | HR 55 | Temp 97.3°F | Resp 14 | Ht 65.0 in | Wt 167.0 lb

## 2017-05-31 DIAGNOSIS — I83893 Varicose veins of bilateral lower extremities with other complications: Secondary | ICD-10-CM

## 2017-05-31 DIAGNOSIS — I998 Other disorder of circulatory system: Secondary | ICD-10-CM | POA: Insufficient documentation

## 2017-05-31 NOTE — Progress Notes (Signed)
Subjective:     Patient ID: [name], female   DOB: 1940-11-15, 76 y.o.   MRN: [id number]  HPI This 76 year old female was referred by Dr. 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 The patient has a remote history of bilateral vein strippings by Drs. Alanson Puls, and Nedra Hai many years ago in this practice. Patient has had deformity of both lower extremities with skin grafting by Dr. Kellie Simmering in the past. She now has edema between the knee and the mid calf bilaterally which is not a new problem but has gotten worse. She is also had small serration in the right leg. She has no history of DVT thrombophlebitis or bleeding.  Past Medical History:  Diagnosis Date  . Chest pain   . Coronary artery disease   . Hypertension   . LBBB (left bundle branch block)     Social History   Tobacco Use  . Smoking status: Never Smoker  . Smokeless tobacco: Never Used  Substance Use Topics  . Alcohol use: No    Family History  Problem Relation Age of Onset  . Cancer Mother   . Heart attack Father     Allergies  Allergen Reactions  . Codeine Nausea And Vomiting  . Penicillins Nausea And Vomiting     Current Outpatient Medications:  .  acetaminophen (TYLENOL) 500 MG tablet, Take 500 mg by mouth every 6 (six) hours as needed for mild pain., Disp: , Rfl:  .  aspirin EC 81 MG tablet, Take 1 tablet (81 mg total) by mouth daily., Disp: 90 tablet, Rfl: 3 .  Cholecalciferol (D-3-5) 5000 units capsule, Take 5,000 Units by mouth daily., Disp: , Rfl:  .  cloNIDine (CATAPRES) 0.1 MG tablet, Take 1 tablet (0.1 mg total) by mouth 2 (two) times daily., Disp: 180 tablet, Rfl: 3 .  fluconazole (DIFLUCAN) 100 MG tablet, , Disp: , Rfl:  .  furosemide (LASIX) 40 MG tablet, Take 40 mg by mouth daily., Disp: , Rfl:  .  HYDROcodone-acetaminophen (NORCO/VICODIN) 5-325 MG tablet, Take 1 tablet by mouth every 6 (six) hours as needed for moderate pain., Disp: , Rfl:  .  losartan (COZAAR) 100  MG tablet, Take 100 mg by mouth daily., Disp: , Rfl:  .  metoprolol succinate (TOPROL-XL) 100 MG 24 hr tablet, Take 100 mg by mouth daily. Take with or immediately following a meal., Disp: , Rfl:  .  atorvastatin (LIPITOR) 40 MG tablet, Take 1 tablet (40 mg total) by mouth daily., Disp: 30 tablet, Rfl: 12 .  cephALEXin (KEFLEX) 500 MG capsule, , Disp: , Rfl:   Vitals:   05/31/17 1423 05/31/17 1431  BP: (!) 179/76 (!) 170/72  Pulse: (!) 54 (!) 55  Resp: 14   Temp: (!) 97.3 F (36.3 C)   TempSrc: Oral   SpO2: 100%   Weight: 167 lb (75.8 kg)   Height: 5\' 5"  (1.651 m)     Body mass index is 27.79 kg/m.         Review of Systems Denies chest pain, dyspnea on exertion, PND, orthopnea, hemoptysis    Objective:   Physical Exam BP (!) 170/72 (BP Location: Left Arm, Patient Position: Sitting, Cuff Size: Normal)   Pulse (!) 55   Temp (!) 97.3 F (36.3 C) (Oral)   Resp 14   Ht 5\' 5"  (1.651 m)   Wt 167 lb (75.8 kg)   SpO2 100%   BMI 27.79 kg/m     Gen.-alert and oriented x3  in no apparent distress HEENT normal for age Lungs no rhonchi or wheezing Cardiovascular regular rhythm no murmurs carotid pulses 3+ palpable no bruits audible Abdomen soft nontender no palpable masses Musculoskeletal free of  major deformities Skin clear -deformity bilateral in mid calf with contraction of skin and hyperpigmentation and some ulceration proximal aspect right leg. No bulging varicosities noted. 1+ distal edema bilaterally. Neurologic normal Lower extremities 3+ femoral and dorsalis pedis pulses palpable bilaterally with 1+ edema  Today I ordered bilateral venous duplex exam chart reviewed and interpreted. There is no DVT. There is some residual great saphenous vein bilaterally but there is no long segment of large caliber which would be conducive of laser ablation. There is deep vein reflux in both lower extremities.       Assessment:     History of bilateral vein strippings with  previous skin grafts for ulceration now with ongoing edema both lower extremities No indication for any laser ablation procedure of superficial venous system    Plan:     #1 elevate foot of bed 3-4 inches which she is doing currently #2 possibly try to arrange for her to have custom-made elastic compression stocking #3 referral to [facility] for ongoing compression care Unfortunately we have nothing to offer from vascular standpoint.

## 2017-07-09 ENCOUNTER — Encounter (HOSPITAL_BASED_OUTPATIENT_CLINIC_OR_DEPARTMENT_OTHER): Payer: Medicare Other

## 2017-07-09 ENCOUNTER — Encounter (HOSPITAL_BASED_OUTPATIENT_CLINIC_OR_DEPARTMENT_OTHER): Payer: Medicare Other | Attending: Internal Medicine

## 2017-07-09 DIAGNOSIS — I447 Left bundle-branch block, unspecified: Secondary | ICD-10-CM | POA: Insufficient documentation

## 2017-07-09 DIAGNOSIS — I10 Essential (primary) hypertension: Secondary | ICD-10-CM | POA: Insufficient documentation

## 2017-07-09 DIAGNOSIS — L97812 Non-pressure chronic ulcer of other part of right lower leg with fat layer exposed: Secondary | ICD-10-CM | POA: Insufficient documentation

## 2017-07-09 DIAGNOSIS — I429 Cardiomyopathy, unspecified: Secondary | ICD-10-CM | POA: Insufficient documentation

## 2017-07-09 DIAGNOSIS — I87331 Chronic venous hypertension (idiopathic) with ulcer and inflammation of right lower extremity: Secondary | ICD-10-CM | POA: Insufficient documentation

## 2017-07-09 DIAGNOSIS — I890 Lymphedema, not elsewhere classified: Secondary | ICD-10-CM | POA: Insufficient documentation

## 2017-07-09 DIAGNOSIS — I2510 Atherosclerotic heart disease of native coronary artery without angina pectoris: Secondary | ICD-10-CM | POA: Diagnosis not present

## 2017-07-16 ENCOUNTER — Encounter (HOSPITAL_BASED_OUTPATIENT_CLINIC_OR_DEPARTMENT_OTHER): Payer: Medicare Other | Attending: Internal Medicine

## 2017-07-16 DIAGNOSIS — L97822 Non-pressure chronic ulcer of other part of left lower leg with fat layer exposed: Secondary | ICD-10-CM | POA: Insufficient documentation

## 2017-07-16 DIAGNOSIS — I872 Venous insufficiency (chronic) (peripheral): Secondary | ICD-10-CM | POA: Insufficient documentation

## 2017-07-16 DIAGNOSIS — I2510 Atherosclerotic heart disease of native coronary artery without angina pectoris: Secondary | ICD-10-CM | POA: Insufficient documentation

## 2017-07-16 DIAGNOSIS — I10 Essential (primary) hypertension: Secondary | ICD-10-CM | POA: Diagnosis not present

## 2017-07-16 DIAGNOSIS — I890 Lymphedema, not elsewhere classified: Secondary | ICD-10-CM | POA: Insufficient documentation

## 2017-07-16 DIAGNOSIS — L97812 Non-pressure chronic ulcer of other part of right lower leg with fat layer exposed: Secondary | ICD-10-CM | POA: Diagnosis not present

## 2017-07-16 DIAGNOSIS — I87331 Chronic venous hypertension (idiopathic) with ulcer and inflammation of right lower extremity: Secondary | ICD-10-CM | POA: Insufficient documentation

## 2017-07-23 DIAGNOSIS — I87331 Chronic venous hypertension (idiopathic) with ulcer and inflammation of right lower extremity: Secondary | ICD-10-CM | POA: Diagnosis not present

## 2017-07-30 DIAGNOSIS — I87331 Chronic venous hypertension (idiopathic) with ulcer and inflammation of right lower extremity: Secondary | ICD-10-CM | POA: Diagnosis not present

## 2017-08-06 DIAGNOSIS — I87331 Chronic venous hypertension (idiopathic) with ulcer and inflammation of right lower extremity: Secondary | ICD-10-CM | POA: Diagnosis not present

## 2017-08-13 ENCOUNTER — Encounter (HOSPITAL_BASED_OUTPATIENT_CLINIC_OR_DEPARTMENT_OTHER): Payer: Medicare Other | Attending: Internal Medicine

## 2017-08-13 DIAGNOSIS — I87331 Chronic venous hypertension (idiopathic) with ulcer and inflammation of right lower extremity: Secondary | ICD-10-CM | POA: Diagnosis not present

## 2017-08-13 DIAGNOSIS — I10 Essential (primary) hypertension: Secondary | ICD-10-CM | POA: Diagnosis not present

## 2017-08-13 DIAGNOSIS — L97812 Non-pressure chronic ulcer of other part of right lower leg with fat layer exposed: Secondary | ICD-10-CM | POA: Insufficient documentation

## 2017-08-13 DIAGNOSIS — L97822 Non-pressure chronic ulcer of other part of left lower leg with fat layer exposed: Secondary | ICD-10-CM | POA: Diagnosis not present

## 2017-08-13 DIAGNOSIS — I2510 Atherosclerotic heart disease of native coronary artery without angina pectoris: Secondary | ICD-10-CM | POA: Insufficient documentation

## 2017-08-13 DIAGNOSIS — I890 Lymphedema, not elsewhere classified: Secondary | ICD-10-CM | POA: Diagnosis present

## 2017-08-20 DIAGNOSIS — I890 Lymphedema, not elsewhere classified: Secondary | ICD-10-CM | POA: Diagnosis not present

## 2017-08-27 DIAGNOSIS — I890 Lymphedema, not elsewhere classified: Secondary | ICD-10-CM | POA: Diagnosis not present

## 2017-09-03 DIAGNOSIS — I890 Lymphedema, not elsewhere classified: Secondary | ICD-10-CM | POA: Diagnosis not present

## 2017-09-10 DIAGNOSIS — I890 Lymphedema, not elsewhere classified: Secondary | ICD-10-CM | POA: Diagnosis not present

## 2017-09-17 ENCOUNTER — Encounter (HOSPITAL_BASED_OUTPATIENT_CLINIC_OR_DEPARTMENT_OTHER): Payer: Medicare Other | Attending: Internal Medicine

## 2017-09-17 DIAGNOSIS — I2510 Atherosclerotic heart disease of native coronary artery without angina pectoris: Secondary | ICD-10-CM | POA: Diagnosis not present

## 2017-09-17 DIAGNOSIS — L97812 Non-pressure chronic ulcer of other part of right lower leg with fat layer exposed: Secondary | ICD-10-CM | POA: Insufficient documentation

## 2017-09-17 DIAGNOSIS — I87331 Chronic venous hypertension (idiopathic) with ulcer and inflammation of right lower extremity: Secondary | ICD-10-CM | POA: Diagnosis not present

## 2017-09-17 DIAGNOSIS — I890 Lymphedema, not elsewhere classified: Secondary | ICD-10-CM | POA: Insufficient documentation

## 2017-09-17 DIAGNOSIS — I87322 Chronic venous hypertension (idiopathic) with inflammation of left lower extremity: Secondary | ICD-10-CM | POA: Diagnosis not present

## 2017-09-17 DIAGNOSIS — I10 Essential (primary) hypertension: Secondary | ICD-10-CM | POA: Diagnosis not present

## 2017-09-24 DIAGNOSIS — I87331 Chronic venous hypertension (idiopathic) with ulcer and inflammation of right lower extremity: Secondary | ICD-10-CM | POA: Diagnosis not present

## 2017-10-01 DIAGNOSIS — I87331 Chronic venous hypertension (idiopathic) with ulcer and inflammation of right lower extremity: Secondary | ICD-10-CM | POA: Diagnosis not present

## 2017-10-08 DIAGNOSIS — I87331 Chronic venous hypertension (idiopathic) with ulcer and inflammation of right lower extremity: Secondary | ICD-10-CM | POA: Diagnosis not present

## 2017-10-15 ENCOUNTER — Encounter (HOSPITAL_BASED_OUTPATIENT_CLINIC_OR_DEPARTMENT_OTHER): Payer: Medicare Other | Attending: Internal Medicine

## 2017-10-15 DIAGNOSIS — I10 Essential (primary) hypertension: Secondary | ICD-10-CM | POA: Diagnosis not present

## 2017-10-15 DIAGNOSIS — I2510 Atherosclerotic heart disease of native coronary artery without angina pectoris: Secondary | ICD-10-CM | POA: Diagnosis not present

## 2017-10-15 DIAGNOSIS — I890 Lymphedema, not elsewhere classified: Secondary | ICD-10-CM | POA: Insufficient documentation

## 2017-10-15 DIAGNOSIS — L97812 Non-pressure chronic ulcer of other part of right lower leg with fat layer exposed: Secondary | ICD-10-CM | POA: Diagnosis not present

## 2017-10-22 DIAGNOSIS — L97812 Non-pressure chronic ulcer of other part of right lower leg with fat layer exposed: Secondary | ICD-10-CM | POA: Diagnosis not present

## 2017-10-29 DIAGNOSIS — L97812 Non-pressure chronic ulcer of other part of right lower leg with fat layer exposed: Secondary | ICD-10-CM | POA: Diagnosis not present

## 2017-11-04 DIAGNOSIS — L97812 Non-pressure chronic ulcer of other part of right lower leg with fat layer exposed: Secondary | ICD-10-CM | POA: Diagnosis not present

## 2017-11-12 DIAGNOSIS — L97812 Non-pressure chronic ulcer of other part of right lower leg with fat layer exposed: Secondary | ICD-10-CM | POA: Diagnosis not present

## 2017-11-19 ENCOUNTER — Encounter (HOSPITAL_BASED_OUTPATIENT_CLINIC_OR_DEPARTMENT_OTHER): Payer: Medicare Other | Attending: Internal Medicine

## 2017-11-19 DIAGNOSIS — I87331 Chronic venous hypertension (idiopathic) with ulcer and inflammation of right lower extremity: Secondary | ICD-10-CM | POA: Diagnosis present

## 2017-11-19 DIAGNOSIS — L97812 Non-pressure chronic ulcer of other part of right lower leg with fat layer exposed: Secondary | ICD-10-CM | POA: Insufficient documentation

## 2017-11-19 DIAGNOSIS — I890 Lymphedema, not elsewhere classified: Secondary | ICD-10-CM | POA: Insufficient documentation

## 2017-11-19 DIAGNOSIS — I10 Essential (primary) hypertension: Secondary | ICD-10-CM | POA: Diagnosis not present

## 2017-11-19 DIAGNOSIS — I2510 Atherosclerotic heart disease of native coronary artery without angina pectoris: Secondary | ICD-10-CM | POA: Insufficient documentation

## 2017-11-26 DIAGNOSIS — I87331 Chronic venous hypertension (idiopathic) with ulcer and inflammation of right lower extremity: Secondary | ICD-10-CM | POA: Diagnosis not present

## 2017-12-03 DIAGNOSIS — I87331 Chronic venous hypertension (idiopathic) with ulcer and inflammation of right lower extremity: Secondary | ICD-10-CM | POA: Diagnosis not present

## 2017-12-10 DIAGNOSIS — I87331 Chronic venous hypertension (idiopathic) with ulcer and inflammation of right lower extremity: Secondary | ICD-10-CM | POA: Diagnosis not present

## 2017-12-17 ENCOUNTER — Encounter (HOSPITAL_BASED_OUTPATIENT_CLINIC_OR_DEPARTMENT_OTHER): Payer: Medicare Other | Attending: Internal Medicine

## 2017-12-17 DIAGNOSIS — I87323 Chronic venous hypertension (idiopathic) with inflammation of bilateral lower extremity: Secondary | ICD-10-CM | POA: Insufficient documentation

## 2017-12-17 DIAGNOSIS — I890 Lymphedema, not elsewhere classified: Secondary | ICD-10-CM | POA: Insufficient documentation

## 2019-09-18 ENCOUNTER — Ambulatory Visit: Payer: Self-pay | Admitting: Physician Assistant

## 2019-10-03 ENCOUNTER — Ambulatory Visit: Payer: Self-pay | Admitting: Family Medicine

## 2019-11-08 ENCOUNTER — Ambulatory Visit: Payer: Self-pay | Admitting: Family Medicine

## 2019-11-08 ENCOUNTER — Encounter: Payer: Self-pay | Admitting: General Practice

## 2020-04-24 ENCOUNTER — Other Ambulatory Visit (HOSPITAL_COMMUNITY): Payer: Self-pay | Admitting: Family

## 2020-04-24 ENCOUNTER — Other Ambulatory Visit: Payer: Self-pay

## 2020-04-24 ENCOUNTER — Ambulatory Visit (HOSPITAL_COMMUNITY)
Admission: RE | Admit: 2020-04-24 | Discharge: 2020-04-24 | Disposition: A | Discharge status: Home / Self Care | Payer: Medicare Other | Source: Ambulatory Visit | Attending: Family | Admitting: Family

## 2020-04-24 DIAGNOSIS — R600 Localized edema: Secondary | ICD-10-CM

## 2020-04-25 ENCOUNTER — Telehealth: Payer: Self-pay | Admitting: Cardiology

## 2020-04-25 ENCOUNTER — Encounter: Payer: Self-pay | Admitting: General Practice

## 2020-04-25 ENCOUNTER — Encounter (HOSPITAL_COMMUNITY): Payer: Self-pay

## 2020-04-25 ENCOUNTER — Ambulatory Visit (HOSPITAL_COMMUNITY): Admission: RE | Admit: 2020-04-25 | Payer: Medicare Other | Source: Ambulatory Visit

## 2020-04-25 NOTE — Telephone Encounter (Signed)
  Recall expunge letter sent 

## 2020-04-26 ENCOUNTER — Other Ambulatory Visit: Payer: Self-pay

## 2020-04-26 ENCOUNTER — Ambulatory Visit (HOSPITAL_COMMUNITY)
Admission: RE | Admit: 2020-04-26 | Discharge: 2020-04-26 | Disposition: A | Discharge status: Home / Self Care | Payer: Medicare Other | Source: Ambulatory Visit | Attending: Family | Admitting: Family

## 2020-04-26 DIAGNOSIS — R600 Localized edema: Secondary | ICD-10-CM | POA: Insufficient documentation

## 2020-06-19 ENCOUNTER — Telehealth: Payer: Self-pay

## 2020-06-19 NOTE — Telephone Encounter (Signed)
Pt called to confirm her appt on tomorrow at 1 pm, and verbalized understanding to arrive by 1245 pm.

## 2020-06-20 ENCOUNTER — Other Ambulatory Visit: Payer: Self-pay

## 2020-06-20 ENCOUNTER — Ambulatory Visit: Payer: Medicare Other | Admitting: Physician Assistant

## 2020-06-20 VITALS — BP 164/82 | HR 84 | Temp 98.7°F | Resp 18 | Ht 65.0 in | Wt 146.3 lb

## 2020-06-20 DIAGNOSIS — I82412 Acute embolism and thrombosis of left femoral vein: Secondary | ICD-10-CM

## 2020-06-20 NOTE — Progress Notes (Signed)
VASCULAR [facility]IN [facility]           OF   History and Physical   [name] is a 80 y.o. female who presents with leg swelling bilaterally.  She was seen in our office by Dr. Hart Rochester in December 2018 and at that time, she was being evaluated for bilateral painful varicose veins.  She had a remote hx of vein strippings by Drs Bascom Levels, Edilia Bo and Nedra Hai many years ago in this practice.  She had had deformity of both lower extremities with skin grafting by Dr. Kellie Simmering in the past.  When she saw Dr. Hart Rochester, she was having edema b/w the knee and the mid calf bilaterally, which was not a new problem but had gotten worse.  At that time, she did not have any hx of DVT thrombophlebitis or bleeding.  Her venous duplex at that time revealed some residual GSV bilaterally, but no long segment of large caliber that would be conducive to laser ablation.  She did have deep vein reflux in both lower extremities.  He recommended elevating the foot of the bed 3-4", which she was doing, possibly trying to arrange for her to have custom made elastic compression stockings, referral to [facility] wound center for ongoing compression care.  Unfortunately, we did not have anything to offer from a vascular standpoint.   She comes in today with c/o leg swelling.  She states that back in October, she had a couple of falls at home that she did not remember.  Her grandson lives with her and took her to the ED in [place].  She was found to have covid 19 PNA.  She goes to [facility] and her previous doctor retired and she now sees Dr. Graylin Shiver.  She states that he made a house call to check on her and after seeing her swollen legs, he scheduled a duplex of her left leg and this revealed a DVT from the left CFV to the popliteal vein.  She has been on Xarelto since that time.  She is referred today for consultation.  She has not had any falls since being discharged from the hospital after 2 days in October. She  states that she did have custom compression made and she does wear these.  She does elevate her legs at night and this also helps with the swelling.  She denies any chest pain or shortness of breath.   The pt is on a statin for cholesterol management.  The pt is on a daily aspirin.   Other AC:  Xarelto The pt is on ARB, BB for hypertension.   The pt is not diabetic.   Tobacco hx:  never   Past Medical History:  Diagnosis Date  . Chest pain   . Coronary artery disease   . Hypertension   . LBBB (left bundle branch block)     Past Surgical History:  Procedure Laterality Date  . ABDOMINAL HYSTERECTOMY    . CARDIAC CATHETERIZATION N/A 03/23/2016   Procedure: Left Heart Cath and Coronary Angiography;  Surgeon: Lennette Bihari, MD;  Location: [facility] INVASIVE CV LAB;  Service: Cardiovascular;  Laterality: N/A;  . CHOLECYSTECTOMY    . VARICOSE VEIN SURGERY      Social History   Socioeconomic History  . Marital status: Widowed    Spouse name: Not on file  . Number of children: 2  . Years of education: Not on file  . Highest education  level: Not on file  Occupational History  . Not on file  Tobacco Use  . Smoking status: Never Smoker  . Smokeless tobacco: Never Used  Vaping Use  . Vaping Use: Never used  Substance and Sexual Activity  . Alcohol use: No  . Drug use: No  . Sexual activity: Not on file  Other Topics Concern  . Not on file  Social History Narrative  . Not on file   Social Determinants of Health   Financial Resource Strain: Not on file  Food Insecurity: Not on file  Transportation Needs: Not on file  Physical Activity: Not on file  Stress: Not on file  Social Connections: Not on file  Intimate Partner Violence: Not on file     Family History  Problem Relation Age of Onset  . Cancer Mother   . Heart attack Father     Current Outpatient Medications  Medication Sig Dispense Refill  . acetaminophen (TYLENOL) 500 MG tablet Take 500 mg by mouth every 6  (six) hours as needed for mild pain.    Marland Kitchen aspirin EC 81 MG tablet Take 1 tablet (81 mg total) by mouth daily. 90 tablet 3  . atorvastatin (LIPITOR) 40 MG tablet Take 1 tablet (40 mg total) by mouth daily. 30 tablet 12  . cephALEXin (KEFLEX) 500 MG capsule     . Cholecalciferol (D-3-5) 5000 units capsule Take 5,000 Units by mouth daily.    . cloNIDine (CATAPRES) 0.1 MG tablet Take 1 tablet (0.1 mg total) by mouth 2 (two) times daily. 180 tablet 3  . fluconazole (DIFLUCAN) 100 MG tablet     . furosemide (LASIX) 40 MG tablet Take 40 mg by mouth daily.    Marland Kitchen HYDROcodone-acetaminophen (NORCO/VICODIN) 5-325 MG tablet Take 1 tablet by mouth every 6 (six) hours as needed for moderate pain.    Marland Kitchen losartan (COZAAR) 100 MG tablet Take 100 mg by mouth daily.    . metoprolol succinate (TOPROL-XL) 100 MG 24 hr tablet Take 100 mg by mouth daily. Take with or immediately following a meal.     No current facility-administered medications for this visit.    Allergies  Allergen Reactions  . Codeine Nausea And Vomiting  . Penicillins Nausea And Vomiting    REVIEW OF SYSTEMS:   [X]  denotes positive finding, [ ]  denotes negative finding Cardiac  Comments:  Chest pain or chest pressure:    Shortness of breath upon exertion:    Short of breath when lying flat:    Irregular heart rhythm:        Vascular    Pain in calf, thigh, or hip brought on by ambulation:    Pain in feet at night that wakes you up from your sleep:     Blood clot in your veins:    Leg swelling:  x       Pulmonary    Oxygen at home:    Productive cough:     Wheezing:         Neurologic    Sudden weakness in arms or legs:     Sudden numbness in arms or legs:     Sudden onset of difficulty speaking or slurred speech:    Temporary loss of vision in one eye:     Problems with dizziness:         Gastrointestinal    Blood in stool:     Vomited blood:         Genitourinary    Burning  when urinating:     Blood in urine:         Psychiatric    Major depression:         Hematologic    Bleeding problems:    Problems with blood clotting too easily:        Skin    Rashes or ulcers:        Constitutional    Fever or chills:      PHYSICAL EXAMINATION:  Today's Vitals   06/20/20 1313 06/20/20 1319  BP: (!) 164/82   Pulse: 84   Resp: 18   Temp: 98.7 F (37.1 C)   TempSrc: Temporal   SpO2: 99%   Weight: 146 lb 4.8 oz (66.4 kg)   Height: 5\' 5"  (1.651 m)   PainSc: 0-No pain 0-No pain   Body mass index is 24.35 kg/m.   General:  WDWN in NAD; vital signs documented above Gait: Not observed HENT: WNL, normocephalic Pulmonary: normal non-labored breathing without wheezing Cardiac: regular HR; without carotid bruits Abdomen: soft, NT, no masses; aortic pulse is not palpable Skin: without rashes Vascular Exam/Pulses:  Right Left  Radial 2+ (normal) 2+ (normal)  DP 2+ (normal) 2+ (normal)  PT Unable to palpate Unable to palpate   Extremities:       Musculoskeletal: no muscle wasting or atrophy  Neurologic: A&O X 3;  moving all extremities equally Psychiatric:  The pt has Normal affect.   Non-Invasive Vascular Imaging:   Venous duplex on 04/26/2020: IMPRESSION: Left lower extremity deep venous thrombosis extending from the popliteal vein to the common femoral vein.  Contralateral femoral vein without evidence of thrombus.     AVIN UPPERMAN is a 80 y.o. female who presents with: bilateral leg swelling with diagnosis of left DVT in the CFV to the popliteal vein.  Discussed pt with Dr. 76.  Recommend Xarelto for total of 6 months.  Would not re-ultrasound unless she has further issues.  She does have swelling in the right leg as well.  Would not necessarily get an u/s of the right leg given the treatment is going to be the same.  She did not have thrombus in the contralateral CFV.  -discussed with pt about continuing to wear her compression stockings as well as elevating her legs.      -pt will f/u as needed.    Darrick Penna, [facility] 06/20/2020 12:51 PM  Clinic MD:  08/18/2020

## 2021-06-06 IMAGING — DX DG CHEST 2V
2 series · 2 of 2 positions shown · non-contrast
Comparison: 03/22/2020 chest radiograph and prior.

CLINICAL DATA: Localized edema

EXAM:
CHEST - 2 VIEW

[chest pa]
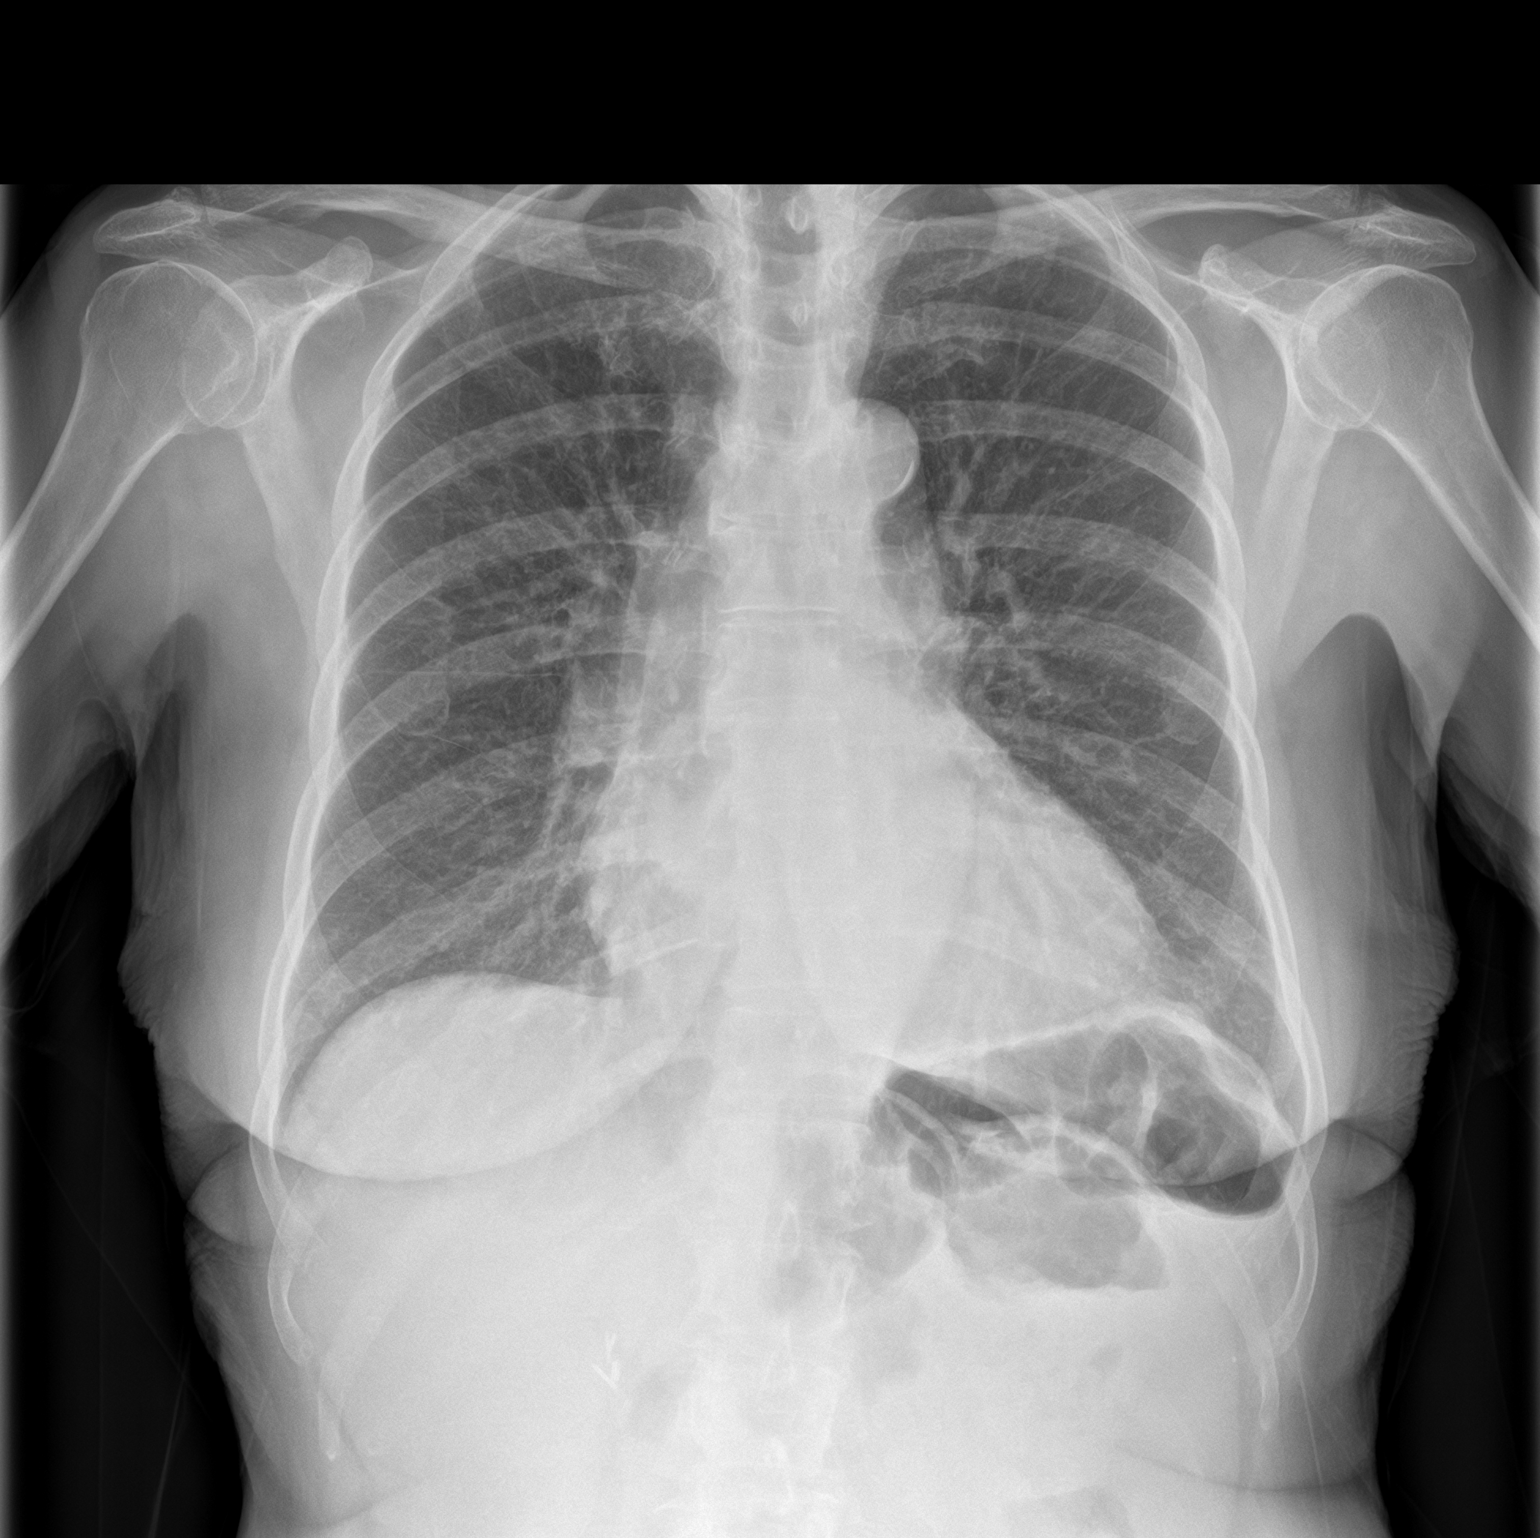

[chest lat]
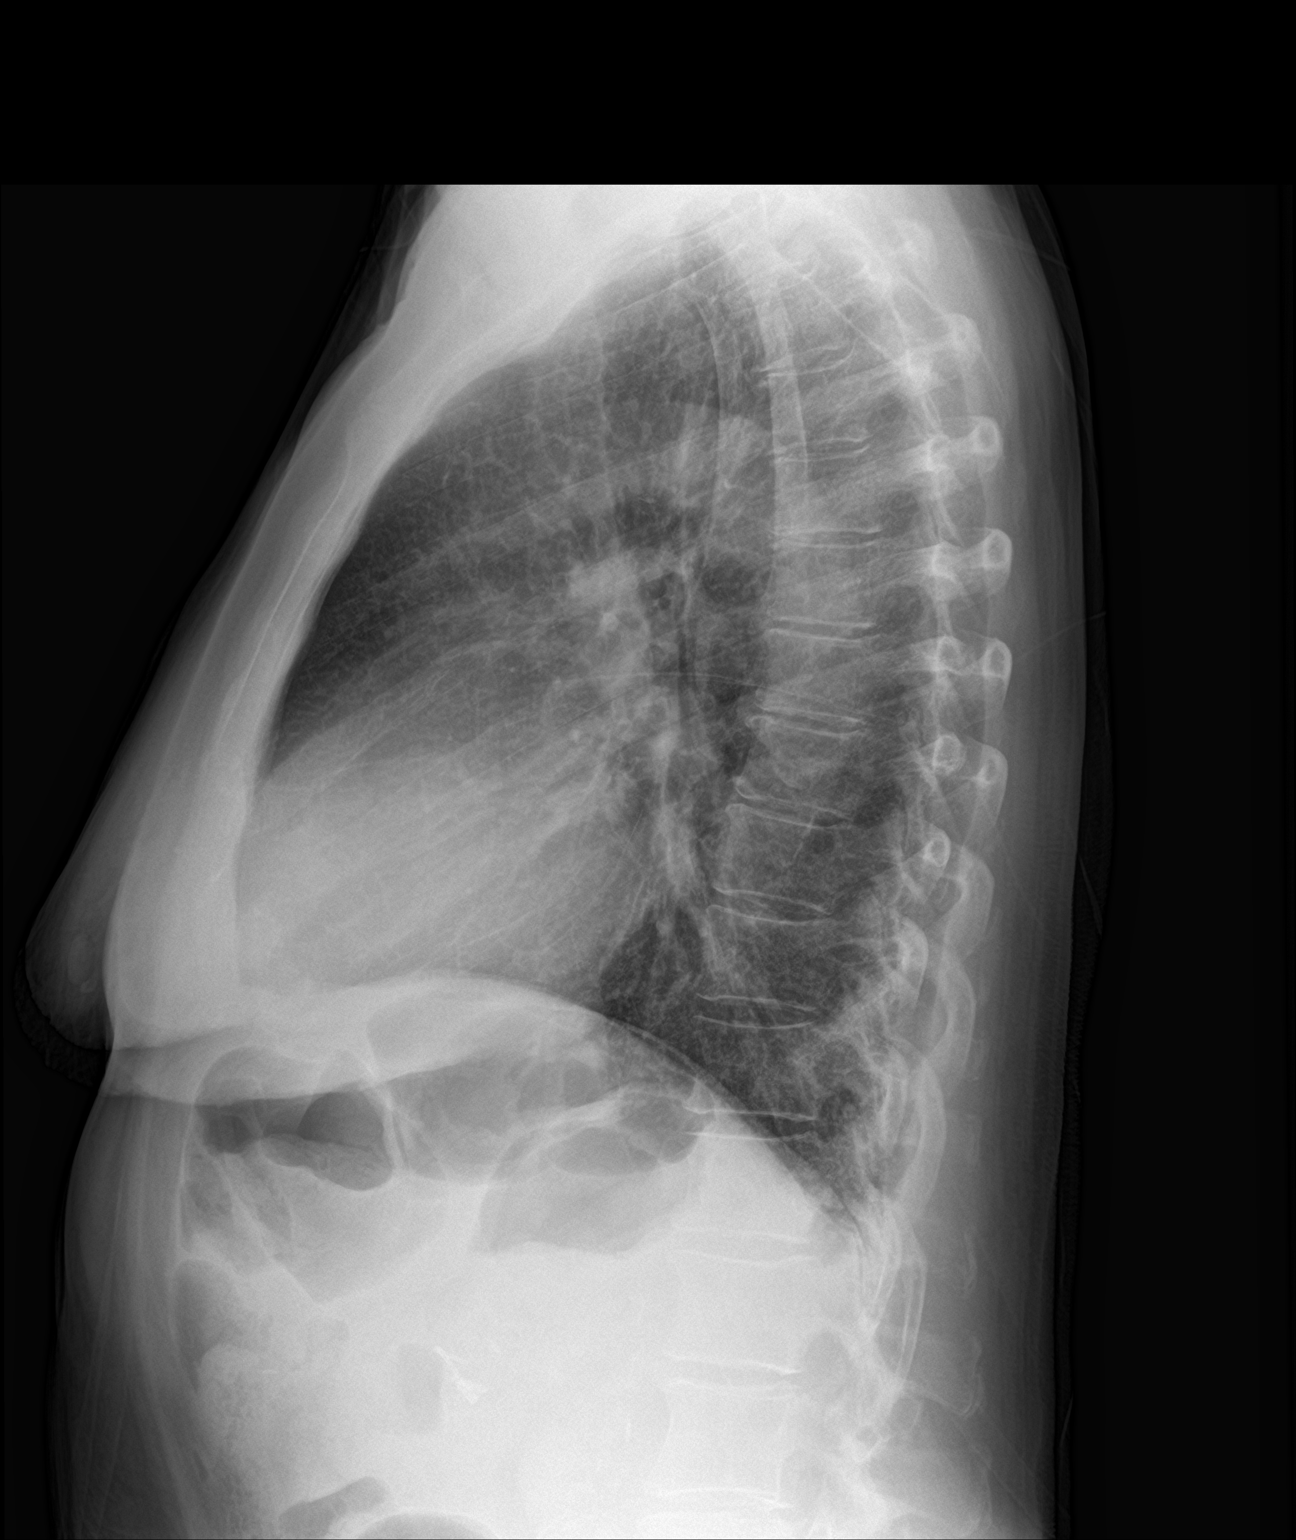

[2 of 2 positions shown; findings below may reference images not displayed]

FINDINGS: Cardiomegaly, unchanged. Central pulmonary vascular congestion with
diffuse interstitial prominence. No focal consolidation,
pneumothorax or pleural effusion. No acute osseous abnormality.
IMPRESSION: Central pulmonary vascular congestion and interstitial prominence
may reflect mild edema.

Unchanged cardiomegaly.  No focal consolidation.

## 2023-04-04 ENCOUNTER — Emergency Department (HOSPITAL_COMMUNITY): Payer: Medicare Other

## 2023-04-04 ENCOUNTER — Inpatient Hospital Stay (HOSPITAL_COMMUNITY): Payer: Medicare Other | Admitting: Certified Registered Nurse Anesthetist

## 2023-04-04 ENCOUNTER — Encounter (HOSPITAL_COMMUNITY)
Admission: EM | Disposition: A | Discharge status: Rehabilitation Facility | Payer: Self-pay | Source: Home / Self Care | Attending: Neurology

## 2023-04-04 ENCOUNTER — Inpatient Hospital Stay (HOSPITAL_COMMUNITY): Payer: Medicare Other

## 2023-04-04 ENCOUNTER — Inpatient Hospital Stay (HOSPITAL_COMMUNITY)
Admission: EM | Admit: 2023-04-04 | Discharge: 2023-04-12 | DRG: 024 | Disposition: A | Discharge status: Rehabilitation Facility | Payer: Medicare Other | Attending: Student in an Organized Health Care Education/Training Program | Admitting: Student in an Organized Health Care Education/Training Program

## 2023-04-04 ENCOUNTER — Other Ambulatory Visit: Payer: Self-pay

## 2023-04-04 DIAGNOSIS — I6602 Occlusion and stenosis of left middle cerebral artery: Secondary | ICD-10-CM | POA: Diagnosis not present

## 2023-04-04 DIAGNOSIS — Z9049 Acquired absence of other specified parts of digestive tract: Secondary | ICD-10-CM | POA: Diagnosis not present

## 2023-04-04 DIAGNOSIS — Z1152 Encounter for screening for COVID-19: Secondary | ICD-10-CM | POA: Diagnosis not present

## 2023-04-04 DIAGNOSIS — R414 Neurologic neglect syndrome: Secondary | ICD-10-CM | POA: Diagnosis present

## 2023-04-04 DIAGNOSIS — I2510 Atherosclerotic heart disease of native coronary artery without angina pectoris: Secondary | ICD-10-CM | POA: Diagnosis present

## 2023-04-04 DIAGNOSIS — I63032 Cerebral infarction due to thrombosis of left carotid artery: Secondary | ICD-10-CM | POA: Diagnosis not present

## 2023-04-04 DIAGNOSIS — I480 Paroxysmal atrial fibrillation: Secondary | ICD-10-CM | POA: Diagnosis not present

## 2023-04-04 DIAGNOSIS — Z809 Family history of malignant neoplasm, unspecified: Secondary | ICD-10-CM | POA: Diagnosis not present

## 2023-04-04 DIAGNOSIS — R270 Ataxia, unspecified: Secondary | ICD-10-CM | POA: Diagnosis present

## 2023-04-04 DIAGNOSIS — I110 Hypertensive heart disease with heart failure: Secondary | ICD-10-CM | POA: Diagnosis present

## 2023-04-04 DIAGNOSIS — I4891 Unspecified atrial fibrillation: Secondary | ICD-10-CM | POA: Diagnosis not present

## 2023-04-04 DIAGNOSIS — R29810 Facial weakness: Secondary | ICD-10-CM | POA: Diagnosis present

## 2023-04-04 DIAGNOSIS — Z8249 Family history of ischemic heart disease and other diseases of the circulatory system: Secondary | ICD-10-CM

## 2023-04-04 DIAGNOSIS — E876 Hypokalemia: Secondary | ICD-10-CM | POA: Diagnosis not present

## 2023-04-04 DIAGNOSIS — I5022 Chronic systolic (congestive) heart failure: Secondary | ICD-10-CM | POA: Diagnosis present

## 2023-04-04 DIAGNOSIS — R471 Dysarthria and anarthria: Secondary | ICD-10-CM | POA: Diagnosis present

## 2023-04-04 DIAGNOSIS — I447 Left bundle-branch block, unspecified: Secondary | ICD-10-CM | POA: Diagnosis present

## 2023-04-04 DIAGNOSIS — Z79899 Other long term (current) drug therapy: Secondary | ICD-10-CM

## 2023-04-04 DIAGNOSIS — I69351 Hemiplegia and hemiparesis following cerebral infarction affecting right dominant side: Secondary | ICD-10-CM | POA: Diagnosis not present

## 2023-04-04 DIAGNOSIS — I6389 Other cerebral infarction: Secondary | ICD-10-CM | POA: Diagnosis not present

## 2023-04-04 DIAGNOSIS — I6300 Cerebral infarction due to thrombosis of unspecified precerebral artery: Secondary | ICD-10-CM | POA: Diagnosis not present

## 2023-04-04 DIAGNOSIS — E785 Hyperlipidemia, unspecified: Secondary | ICD-10-CM | POA: Diagnosis present

## 2023-04-04 DIAGNOSIS — I63512 Cerebral infarction due to unspecified occlusion or stenosis of left middle cerebral artery: Principal | ICD-10-CM | POA: Diagnosis present

## 2023-04-04 DIAGNOSIS — R531 Weakness: Secondary | ICD-10-CM

## 2023-04-04 DIAGNOSIS — Z885 Allergy status to narcotic agent status: Secondary | ICD-10-CM

## 2023-04-04 DIAGNOSIS — Z7982 Long term (current) use of aspirin: Secondary | ICD-10-CM | POA: Diagnosis not present

## 2023-04-04 DIAGNOSIS — G8191 Hemiplegia, unspecified affecting right dominant side: Secondary | ICD-10-CM | POA: Diagnosis present

## 2023-04-04 DIAGNOSIS — Z7901 Long term (current) use of anticoagulants: Secondary | ICD-10-CM

## 2023-04-04 DIAGNOSIS — R1310 Dysphagia, unspecified: Secondary | ICD-10-CM | POA: Diagnosis present

## 2023-04-04 DIAGNOSIS — R4701 Aphasia: Secondary | ICD-10-CM | POA: Diagnosis present

## 2023-04-04 DIAGNOSIS — I63421 Cerebral infarction due to embolism of right anterior cerebral artery: Principal | ICD-10-CM | POA: Diagnosis present

## 2023-04-04 DIAGNOSIS — R29723 NIHSS score 23: Secondary | ICD-10-CM | POA: Diagnosis present

## 2023-04-04 DIAGNOSIS — I639 Cerebral infarction, unspecified: Principal | ICD-10-CM | POA: Diagnosis present

## 2023-04-04 DIAGNOSIS — Z90710 Acquired absence of both cervix and uterus: Secondary | ICD-10-CM

## 2023-04-04 DIAGNOSIS — Z880 Allergy status to penicillin: Secondary | ICD-10-CM

## 2023-04-04 DIAGNOSIS — I69331 Monoplegia of upper limb following cerebral infarction affecting right dominant side: Secondary | ICD-10-CM | POA: Diagnosis not present

## 2023-04-04 DIAGNOSIS — I4819 Other persistent atrial fibrillation: Secondary | ICD-10-CM | POA: Diagnosis not present

## 2023-04-04 DIAGNOSIS — I63412 Cerebral infarction due to embolism of left middle cerebral artery: Secondary | ICD-10-CM | POA: Diagnosis not present

## 2023-04-04 HISTORY — PX: IR PERCUTANEOUS ART THROMBECTOMY/INFUSION INTRACRANIAL INC DIAG ANGIO: IMG6087

## 2023-04-04 HISTORY — PX: IR CT HEAD LTD: IMG2386

## 2023-04-04 HISTORY — PX: RADIOLOGY WITH ANESTHESIA: SHX6223

## 2023-04-04 LAB — DIFFERENTIAL
Abs Immature Granulocytes: 0 10*3/uL (ref 0.00–0.07)
Band Neutrophils: 2 %
Basophils Absolute: 0 10*3/uL (ref 0.0–0.1)
Basophils Relative: 0 %
Eosinophils Absolute: 0.1 10*3/uL (ref 0.0–0.5)
Eosinophils Relative: 1 %
Lymphocytes Relative: 14 %
Lymphs Abs: 1.1 10*3/uL (ref 0.7–4.0)
Monocytes Absolute: 0.2 10*3/uL (ref 0.1–1.0)
Monocytes Relative: 2 %
Neutro Abs: 6.2 10*3/uL (ref 1.7–7.7)
Neutrophils Relative %: 81 %

## 2023-04-04 LAB — GLUCOSE, CAPILLARY
Glucose-Capillary: 145 mg/dL — ABNORMAL HIGH (ref 70–99)
Glucose-Capillary: 174 mg/dL — ABNORMAL HIGH (ref 70–99)
Glucose-Capillary: 123 mg/dL — ABNORMAL HIGH (ref 70–99)

## 2023-04-04 LAB — CBC
HCT: 40 % (ref 36.0–46.0)
Hemoglobin: 13.2 g/dL (ref 12.0–15.0)
MCH: 28.9 pg (ref 26.0–34.0)
MCHC: 33 g/dL (ref 30.0–36.0)
MCV: 87.7 fL (ref 80.0–100.0)
Platelets: 140 10*3/uL — ABNORMAL LOW (ref 150–400)
RBC: 4.56 MIL/uL (ref 3.87–5.11)
RDW: 12.6 % (ref 11.5–15.5)
WBC: 7.5 10*3/uL (ref 4.0–10.5)
nRBC: 0 % (ref 0.0–0.2)

## 2023-04-04 LAB — URINALYSIS, ROUTINE W REFLEX MICROSCOPIC
Bacteria, UA: NONE SEEN
Bilirubin Urine: NEGATIVE
Glucose, UA: NEGATIVE mg/dL
Ketones, ur: NEGATIVE mg/dL
Leukocytes,Ua: NEGATIVE
Nitrite: NEGATIVE
Protein, ur: NEGATIVE mg/dL
Specific Gravity, Urine: 1.02 (ref 1.005–1.030)
pH: 7 (ref 5.0–8.0)

## 2023-04-04 LAB — COMPREHENSIVE METABOLIC PANEL
ALT: 12 U/L (ref 0–44)
AST: 16 U/L (ref 15–41)
Albumin: 4 g/dL (ref 3.5–5.0)
Alkaline Phosphatase: 50 U/L (ref 38–126)
Anion gap: 8 (ref 5–15)
BUN: 13 mg/dL (ref 8–23)
CO2: 24 mmol/L (ref 22–32)
Calcium: 8.8 mg/dL — ABNORMAL LOW (ref 8.9–10.3)
Chloride: 103 mmol/L (ref 98–111)
Creatinine, Ser: 0.88 mg/dL (ref 0.44–1.00)
GFR, Estimated: >60 mL/min (ref >60)
Glucose, Bld: 169 mg/dL — ABNORMAL HIGH (ref 70–99)
Potassium: 3.7 mmol/L (ref 3.5–5.1)
Sodium: 135 mmol/L (ref 135–145)
Total Bilirubin: 0.7 mg/dL (ref 0.3–1.2)
Total Protein: 7.2 g/dL (ref 6.5–8.1)

## 2023-04-04 LAB — POCT I-STAT, CHEM 8
BUN: 12 mg/dL (ref 8–23)
Calcium, Ion: 1.08 mmol/L — ABNORMAL LOW (ref 1.15–1.40)
Chloride: 106 mmol/L (ref 98–111)
Creatinine, Ser: 0.8 mg/dL (ref 0.44–1.00)
Glucose, Bld: 165 mg/dL — ABNORMAL HIGH (ref 70–99)
HCT: 40 % (ref 36.0–46.0)
Hemoglobin: 13.6 g/dL (ref 12.0–15.0)
Potassium: 3.9 mmol/L (ref 3.5–5.1)
Sodium: 140 mmol/L (ref 135–145)
TCO2: 22 mmol/L (ref 22–32)

## 2023-04-04 LAB — CK: Total CK: 78 U/L (ref 38–234)

## 2023-04-04 LAB — RAPID URINE DRUG SCREEN, HOSP PERFORMED
Amphetamines: NOT DETECTED
Barbiturates: NOT DETECTED
Benzodiazepines: NOT DETECTED
Cocaine: NOT DETECTED
Opiates: NOT DETECTED
Tetrahydrocannabinol: NOT DETECTED

## 2023-04-04 LAB — RESP PANEL BY RT-PCR (RSV, FLU A&B, COVID)  RVPGX2
Influenza A by PCR: NEGATIVE
Influenza B by PCR: NEGATIVE
Resp Syncytial Virus by PCR: NEGATIVE
SARS Coronavirus 2 by RT PCR: NEGATIVE

## 2023-04-04 LAB — APTT: aPTT: 24 s (ref 24–36)

## 2023-04-04 LAB — TROPONIN I (HIGH SENSITIVITY)
Troponin I (High Sensitivity): 10 ng/L (ref <18)
Troponin I (High Sensitivity): 28 ng/L — ABNORMAL HIGH (ref <18)

## 2023-04-04 LAB — TSH: TSH: 3.014 u[IU]/mL (ref 0.350–4.500)

## 2023-04-04 LAB — PROTIME-INR
INR: 1.1 (ref 0.8–1.2)
Prothrombin Time: 14.2 s (ref 11.4–15.2)

## 2023-04-04 LAB — MRSA NEXT GEN BY PCR, NASAL: MRSA by PCR Next Gen: NOT DETECTED

## 2023-04-04 LAB — AMMONIA: Ammonia: <10 umol/L (ref 9–35)

## 2023-04-04 LAB — HEMOGLOBIN A1C
Hgb A1c MFr Bld: 5.6 % (ref 4.8–5.6)
Mean Plasma Glucose: 114.02 mg/dL

## 2023-04-04 LAB — BRAIN NATRIURETIC PEPTIDE: B Natriuretic Peptide: 228 pg/mL — ABNORMAL HIGH (ref 0.0–100.0)

## 2023-04-04 LAB — ETHANOL: Alcohol, Ethyl (B): <10 mg/dL (ref <10)

## 2023-04-04 LAB — T4, FREE: Free T4: 0.86 ng/dL (ref 0.61–1.12)

## 2023-04-04 SURGERY — IR WITH ANESTHESIA
Anesthesia: General

## 2023-04-04 MED ORDER — CLEVIDIPINE BUTYRATE 0.5 MG/ML IV EMUL: 0.0000 mg/h | INTRAVENOUS | Status: DC

## 2023-04-04 MED ORDER — FENTANYL CITRATE (PF) 100 MCG/2ML IJ SOLN
INTRAMUSCULAR | Status: DC | PRN
  Administered 2023-04-04: 50 ug via INTRAVENOUS

## 2023-04-04 MED ORDER — ACETAMINOPHEN 325 MG PO TABS: 650.0000 mg | ORAL_TABLET | ORAL | Status: DC | PRN

## 2023-04-04 MED ORDER — CLEVIDIPINE BUTYRATE 0.5 MG/ML IV EMUL
0.0000 mg/h | INTRAVENOUS | Status: DC
  Administered 2023-04-04: 2 mg/h via INTRAVENOUS
  Administered 2023-04-04: 10 mg/h via INTRAVENOUS
  Filled 2023-04-04 (×2): qty 50

## 2023-04-04 MED ORDER — ACETAMINOPHEN 160 MG/5ML PO SOLN: 650.0000 mg | ORAL | Status: DC | PRN

## 2023-04-04 MED ORDER — EPHEDRINE SULFATE (PRESSORS) 50 MG/ML IJ SOLN
INTRAMUSCULAR | Status: DC | PRN
  Administered 2023-04-04: 5 mg via INTRAVENOUS

## 2023-04-04 MED ORDER — PHENYLEPHRINE HCL (PRESSORS) 10 MG/ML IV SOLN
INTRAVENOUS | Status: DC | PRN
  Administered 2023-04-04: 160 ug via INTRAVENOUS

## 2023-04-04 MED ORDER — ACETAMINOPHEN 650 MG RE SUPP: 650.0000 mg | RECTAL | Status: DC | PRN

## 2023-04-04 MED ORDER — LIDOCAINE 2% (20 MG/ML) 5 ML SYRINGE
INTRAMUSCULAR | Status: DC | PRN
  Administered 2023-04-04: 40 mg via INTRAVENOUS

## 2023-04-04 MED ORDER — IOHEXOL 300 MG/ML  SOLN
150.0000 mL | Freq: Once | INTRAMUSCULAR | Status: AC | PRN
Start: 2023-04-04 — End: 2023-04-04
  Administered 2023-04-04: 50 mL via INTRA_ARTERIAL

## 2023-04-04 MED ORDER — ONDANSETRON HCL 4 MG/2ML IJ SOLN
INTRAMUSCULAR | Status: AC
  Filled 2023-04-04: qty 2

## 2023-04-04 MED ORDER — PHENYLEPHRINE HCL-NACL 20-0.9 MG/250ML-% IV SOLN
INTRAVENOUS | Status: DC | PRN
  Administered 2023-04-04: 20 ug/min via INTRAVENOUS

## 2023-04-04 MED ORDER — ROCURONIUM BROMIDE 10 MG/ML (PF) SYRINGE
PREFILLED_SYRINGE | INTRAVENOUS | Status: DC | PRN
  Administered 2023-04-04: 30 mg via INTRAVENOUS
  Administered 2023-04-04: 20 mg via INTRAVENOUS

## 2023-04-04 MED ORDER — VANCOMYCIN HCL 1000 MG IV SOLR
INTRAVENOUS | Status: DC | PRN
  Administered 2023-04-04: 1000 mg via INTRAVENOUS

## 2023-04-04 MED ORDER — VANCOMYCIN HCL IN DEXTROSE 1-5 GM/200ML-% IV SOLN
INTRAVENOUS | Status: AC
  Filled 2023-04-04: qty 200

## 2023-04-04 MED ORDER — SODIUM CHLORIDE 0.9 % IV BOLUS
500.0000 mL | Freq: Once | INTRAVENOUS | Status: AC
  Administered 2023-04-04: 500 mL via INTRAVENOUS

## 2023-04-04 MED ORDER — SENNOSIDES-DOCUSATE SODIUM 8.6-50 MG PO TABS: 1.0000 | ORAL_TABLET | Freq: Every evening | ORAL | Status: DC | PRN

## 2023-04-04 MED ORDER — ONDANSETRON HCL 4 MG/2ML IJ SOLN
4.0000 mg | Freq: Once | INTRAMUSCULAR | Status: AC
  Administered 2023-04-04: 4 mg via INTRAVENOUS

## 2023-04-04 MED ORDER — SUGAMMADEX SODIUM 200 MG/2ML IV SOLN
INTRAVENOUS | Status: DC | PRN
  Administered 2023-04-04: 150 mg via INTRAVENOUS

## 2023-04-04 MED ORDER — IOHEXOL 300 MG/ML  SOLN
100.0000 mL | Freq: Once | INTRAMUSCULAR | Status: AC | PRN
Start: 2023-04-04 — End: 2023-04-04
  Administered 2023-04-04: 40 mL via INTRA_ARTERIAL

## 2023-04-04 MED ORDER — VERAPAMIL HCL 2.5 MG/ML IV SOLN
INTRAVENOUS | Status: AC
  Filled 2023-04-04: qty 2

## 2023-04-04 MED ORDER — VERAPAMIL HCL 2.5 MG/ML IV SOLN
INTRAVENOUS | Status: AC | PRN
  Administered 2023-04-04: 2.5 mg via INTRA_ARTERIAL

## 2023-04-04 MED ORDER — SODIUM CHLORIDE 0.9 % IV SOLN
100.0000 mL/h | INTRAVENOUS | Status: DC
  Administered 2023-04-04: 100 mL/h via INTRAVENOUS

## 2023-04-04 MED ORDER — NITROGLYCERIN 1 MG/10 ML FOR IR/CATH LAB
INTRA_ARTERIAL | Status: AC | PRN
  Administered 2023-04-04: 50 ug via INTRA_ARTERIAL
  Administered 2023-04-04 (×2): 25 ug via INTRA_ARTERIAL
  Administered 2023-04-04 (×2): 50 ug via INTRA_ARTERIAL
  Administered 2023-04-04: 25 ug via INTRA_ARTERIAL

## 2023-04-04 MED ORDER — ALBUMIN HUMAN 5 % IV SOLN: INTRAVENOUS | Status: DC | PRN

## 2023-04-04 MED ORDER — PANTOPRAZOLE SODIUM 40 MG IV SOLR
40.0000 mg | Freq: Every day | INTRAVENOUS | Status: DC
  Administered 2023-04-05: 40 mg via INTRAVENOUS

## 2023-04-04 MED ORDER — ACETAMINOPHEN 325 MG PO TABS
650.0000 mg | ORAL_TABLET | ORAL | Status: DC | PRN
  Administered 2023-04-06 – 2023-04-11 (×13): 650 mg via ORAL
  Filled 2023-04-04 (×12): qty 2

## 2023-04-04 MED ORDER — SODIUM CHLORIDE 0.9 % IV SOLN: INTRAVENOUS | Status: DC

## 2023-04-04 MED ORDER — CHLORHEXIDINE GLUCONATE CLOTH 2 % EX PADS
6.0000 | MEDICATED_PAD | Freq: Every day | CUTANEOUS | Status: DC
  Administered 2023-04-04 – 2023-04-06 (×3): 6 via TOPICAL

## 2023-04-04 MED ORDER — ORAL CARE MOUTH RINSE
15.0000 mL | OROMUCOSAL | Status: DC
  Administered 2023-04-04 – 2023-04-05 (×3): 15 mL via OROMUCOSAL

## 2023-04-04 MED ORDER — GLYCOPYRROLATE 0.2 MG/ML IJ SOLN
INTRAMUSCULAR | Status: DC | PRN
  Administered 2023-04-04: .2 mg via INTRAVENOUS

## 2023-04-04 MED ORDER — SUCCINYLCHOLINE CHLORIDE 200 MG/10ML IV SOSY
PREFILLED_SYRINGE | INTRAVENOUS | Status: DC | PRN
  Administered 2023-04-04: 100 mg via INTRAVENOUS

## 2023-04-04 MED ORDER — IOHEXOL 350 MG/ML SOLN
100.0000 mL | Freq: Once | INTRAVENOUS | Status: AC | PRN
  Administered 2023-04-04: 100 mL via INTRAVENOUS

## 2023-04-04 MED ORDER — DEXAMETHASONE SODIUM PHOSPHATE 10 MG/ML IJ SOLN
INTRAMUSCULAR | Status: DC | PRN
  Administered 2023-04-04: 5 mg via INTRAVENOUS

## 2023-04-04 MED ORDER — PROPOFOL 10 MG/ML IV BOLUS
INTRAVENOUS | Status: DC | PRN
  Administered 2023-04-04: 120 mg via INTRAVENOUS

## 2023-04-04 MED ORDER — ORAL CARE MOUTH RINSE: 15.0000 mL | OROMUCOSAL | Status: DC | PRN

## 2023-04-04 MED ORDER — STROKE: EARLY STAGES OF RECOVERY BOOK
Freq: Once | Status: AC
  Filled 2023-04-04: qty 1

## 2023-04-04 NOTE — Procedures (Signed)
INR.  Status post left heart come for 2 g  For right CFA approach.  Findings.  1.  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  Status post complete revascularization of the occluded left MCA M1 distally with 2 passes of solitaire X 4 x 40 mm length complex  retriever and  contact aspiration achieving a TICI 3 revascularization.  Post CT brain no evidence of intracranial hemorrhage.  8 French Angio-Seal closure device used for hemostasis at the right groin puncture site.  Distal pulses all present bilaterally unchanged from prior to  the  procedure.  Patient extubated.  Pupils  RT 2/3 mm .Lt 2mm sluggishly reactive  Moves both lower extremities. No movement of the RT upper extremity.  S.Lathen Seal MD

## 2023-04-04 NOTE — Code Documentation (Addendum)
Stroke Documentation  [name] arrived to [facility] as Code Stroke via [place] from [facility]. Upon arrival, stopped for CT since patient vomited x1 during transport prior to continuing to IR. See flowsheet for times. NIH 19. Report given to IR RN by [facility].  Scarlette Slice K  Rapid Response RN

## 2023-04-04 NOTE — ED Notes (Signed)
Pt vomited green when moved to [facility] stretcher. EDP called verbal order given for 4mg  zofran.

## 2023-04-04 NOTE — Consult Note (Addendum)
[facility] TeleNeurology Consult Services    Date of Service:  04/04/2023      Metrics: Last Known Well: 7:00 PM Saturday Symptoms: As per HPI.  Patient is not a candidate for thrombolytic. Out of the time window.   Location of the provider: [facility]  Location of the patient: [facility] Pre-Morbid Modified Rankin Scale: 0  Time NIHSS completed: 10:30 AM    This consult was provided via telemedicine with 2-way video and audio communication. The patient/family was informed that care would be provided in this way and agreed to receive care in this manner.   ED Physician notified of diagnostic impression and management plan at 11:10 AM   Assessment: 82 year old female with a PMHx of CAD, HTN, LBBB who presents to [facility] from home via EMS after family noted the patient to be acutely weak on the right, with right facial droop and aphasia. LKN was 7:00 PM last night.   - Exam reveals speech and motor deficits best referable to the left middle cerebral artery vascular territory.  - CT head: No evidence of acute infarction or hemorrhage. Mild generalized brain atrophy. Mild chronic small vessel ischemia in the deep cerebral white matter. - The patient is out of the time window for TNK.  - CTA of head and neck:  Emergent large vessel occlusion at the left M1 segment. There is 146 cc of penumbra and no core infarct by CT perfusion. No flow reducing stenosis or embolic source seen underlying the embolus. Atherosclerosis with flow reducing stenosis at the right vertebral origin. Notable atheromatous irregularity of the posterior cerebral arteries. 2 mm left superior cerebellar aneurysm. Interstitial edema in the apical lungs. - The patient is a VIR candidate. Risks/benefits of the procedure were discussed extensively with the patient's son, Brett Canales, including approximately 50% chance of significant improvement relative to an approximate 10% chance of  subarachnoid hemorrhage with possibility of significant worsening including death. The patient's son expressed understanding and provided informed consent to proceed with VIR. All questions answered. Consent witnessed by Wilburt Finlay, RN.  - Discussed with Dr. Corliss Skains and Dr. Wilford Corner. Code IR has been called.      Recommendations: - Emergently transferring to [facility] for VIR.  - Post-IR order set to include frequent neuro checks and BP management.  - Admitting to Neuro ICU at [facility] following VIR.  - No antiplatelet medications or anticoagulants for at least 24 hours following IR, unless a stent is placed.  - DVT prophylaxis with SCDs.  - Continue atorvastatin.  - TTE.  - MRI brain.  - PT/OT/Speech.  -.NPO until passes swallow evaluation.  - Telemetry monitoring - Fasting lipid panel, HgbA1c     ------------------------------------------------------------------------------   History of Present Illness: 82 year old female with a PMHx of CAD, HTN, LBBB who presents to the [facility] from home via EMS after family noted the patient to be acutely weak on the right, with right facial droop and aphasia. LKN was 7:00 PM last night.   A prior note by Vascular Surgery from about 2 years ago documenting her as being on Xarelto for a DVT diagnosed in 2022. Her most recent medications listed in Epic do not include an anticoagulant. She is on ASA and atorvastatin at home.   Her son states that she is fully functioning at baseline and still drives.      Past Medical History: Past Medical History:  Diagnosis Date   Chest pain  Coronary artery disease    Hypertension    LBBB (left bundle branch block)       Past Surgical History: Past Surgical History:  Procedure Laterality Date   ABDOMINAL HYSTERECTOMY     CARDIAC CATHETERIZATION N/A 03/23/2016   Procedure: Left Heart Cath and Coronary Angiography;  Surgeon: Lennette Bihari, MD;  Location: [facility] INVASIVE CV LAB;  Service: Cardiovascular;  Laterality:  N/A;   CHOLECYSTECTOMY     VARICOSE VEIN SURGERY         Medications:  No current facility-administered medications on file prior to encounter.   Current Outpatient Medications on File Prior to Encounter  Medication Sig Dispense Refill   acetaminophen (TYLENOL) 500 MG tablet Take 500 mg by mouth every 6 (six) hours as needed for mild pain.     aspirin EC 81 MG tablet Take 1 tablet (81 mg total) by mouth daily. 90 tablet 3   atorvastatin (LIPITOR) 40 MG tablet Take 1 tablet (40 mg total) by mouth daily. 30 tablet 12   cephALEXin (KEFLEX) 500 MG capsule      Cholecalciferol (D-3-5) 5000 units capsule Take 5,000 Units by mouth daily.     cloNIDine (CATAPRES) 0.1 MG tablet Take 1 tablet (0.1 mg total) by mouth 2 (two) times daily. 180 tablet 3   fluconazole (DIFLUCAN) 100 MG tablet      furosemide (LASIX) 40 MG tablet Take 40 mg by mouth daily.     HYDROcodone-acetaminophen (NORCO/VICODIN) 5-325 MG tablet Take 1 tablet by mouth every 6 (six) hours as needed for moderate pain.     losartan (COZAAR) 100 MG tablet Take 100 mg by mouth daily.     metoprolol succinate (TOPROL-XL) 100 MG 24 hr tablet Take 100 mg by mouth daily. Take with or immediately following a meal.           Social History: Drug Use: None EtOH use: None Never smoker   Family History:  Reviewed in Epic   ROS: As per HPI    Anticoagulant use:  None   Antiplatelet use: ASA   Examination:      1A: Level of Consciousness - 1 1B: Ask Month and Age - 2 1C: Blink Eyes & Squeeze Hands - 1 2: Test Horizontal Extraocular Movements - 2 3: Test Visual Fields - 2 4: Test Facial Palsy (Use Grimace if Obtunded) - 2 5A: Test Left Arm Motor Drift - 0 5B: Test Right Arm Motor Drift - 4 6A: Test Left Leg Motor Drift - 0 6B: Test Right Leg Motor Drift - 2 7: Test Limb Ataxia (FNF/Heel-Shin) - 2 (bilateral H-S is ataxic) 8: Test Sensation -  1 9: Test Language/Aphasia - 2 10: Test Dysarthria - Severe Dysarthria: 1 11:  Test Extinction/Inattention - Extinction to bilateral simultaneous stimulation 1   NIHSS Score: 23     Patient/Family was informed the Neurology Consult would occur via TeleHealth consult by way of interactive audio and video telecommunications and consented to receiving care in this manner.   Patient is being evaluated for possible acute neurologic impairment and high pretest probability of imminent or life-threatening deterioration. I spent total of 40 minutes providing care to this patient, including time for face to face visit via telemedicine, review of medical records, imaging studies and discussion of findings with providers, the patient and/or family.   Electronically signed: Dr. Caryl Pina

## 2023-04-04 NOTE — ED Notes (Signed)
Neurologist signed off.

## 2023-04-04 NOTE — Anesthesia Procedure Notes (Signed)
Procedure Name: Intubation Date/Time: 04/04/2023 2:28 AM  Performed by: Alwyn Ren, CRNAPre-anesthesia Checklist: Patient identified, Emergency Drugs available, Suction available and Patient being monitored Patient Re-evaluated:Patient Re-evaluated prior to induction Oxygen Delivery Method: Circle system utilized Preoxygenation: Pre-oxygenation with 100% oxygen Induction Type: IV induction and Rapid sequence Ventilation: Mask ventilation without difficulty Laryngoscope Size: Miller and 2 Grade View: Grade I Tube type: Oral Tube size: 7.0 mm Number of attempts: 1 Airway Equipment and Method: Stylet Placement Confirmation: ETT inserted through vocal cords under direct vision, positive ETCO2 and breath sounds checked- equal and bilateral Secured at: 21 cm Tube secured with: Tape Dental Injury: Teeth and Oropharynx as per pre-operative assessment

## 2023-04-04 NOTE — Progress Notes (Signed)
Patient arrived to 4NICU from PACU around 1630.  Right groin site level 0 and pulses palpable.  No belongings noted.

## 2023-04-04 NOTE — ED Notes (Signed)
1610 Code Stroke cart activated- EMS Pre-elert 1004 Dr Otelia Limes paged [phone] Door time, Dr Wallace Cullens at bedside, EMS state LKW 7pm bedtime last night, pt found in floor at 9am this morning. R facial droop, aphasia, R side weakness, L gaze. BP 231/95 glucose 145 1010 Dr Otelia Limes on camera 1013 pt to CT 1019 pt back in room 1020 examined by Dr Otelia Limes 1033 pt sent back to CT for CTA/P 1047 pt back from CT, family now in room

## 2023-04-04 NOTE — H&P (Signed)
NEUROLOGY H&P NOTE   Date of service: April 04, 2023 Patient Name: [name] MRN:  [id number] DOB:  [birth year] Chief Complaint: "rt sided weakness, aphasia"  History of Present Illness  [name] is a 82 y.o. female  has a past medical history of Chest pain, Coronary artery disease, Hypertension, and LBBB (left bundle branch block). who presents with right-sided weakness, with a good baseline per family of living independently and driving, presented to [facility] via EMS after family noted patient to be acutely weak on the right side with right facial droop and aphasia.  Their last seen her 7 PM last night. She is on aspirin and statin for her medications at home. Was seen by Dr. Otelia Limes via telestroke. Not a candidate for TNK being OSW.  CT angiography head and neck revealed left M1 occlusion, CT perfusion study with 146 cc of penumbra and no core infarct. Transferred to [facility] On the way had episode of vomiting. Stopped at CT to ensure no bleed prior to proceeding to the IR suite.  Last known well: 7 PM 04/03/2023 Modified rankin score: 0-Completely asymptomatic and back to baseline post- stroke tNKASE: Outside the window Thrombectomy: Yes NIHSS components Score: Comment  1a Level of Conscious 0[]  1[x]  2[]  3[]      1b LOC Questions 0[]  1[x]  2[]       1c LOC Commands 0[x]  1[]  2[]       2 Best Gaze 0[]  1[]  2[x]       3 Visual 0[]  1[]  2[x]  3[]      4 Facial Palsy 0[]  1[]  2[x]  3[]      5a Motor Arm - left 0[x]  1[]  2[]  3[]  4[]  UN[]    5b Motor Arm - Right 0[]  1[]  2[]  3[]  4[x]  UN[]    6a Motor Leg - Left 0[x]  1[]  2[]  3[]  4[]  UN[]    6b Motor Leg - Right 0[]  1[]  2[x]  3[]  4[]  UN[]    7 Limb Ataxia 0[x]  1[]  2[]  3[]  UN[]     8 Sensory 0[]  1[]  2[x]  UN[]      9 Best Language 0[]  1[x]  2[]  3[]      10 Dysarthria 0[]  1[]  2[x]  UN[]      11 Extinct. and Inattention 0[x]  1[]  2[]       TOTAL: 19      ROS   Complains of ROS performed-pertinent positives in HPI.  Rest  negative.  Past History   Past Medical History:  Diagnosis Date   Chest pain    Coronary artery disease    Hypertension    LBBB (left bundle branch block)    Past Surgical History:  Procedure Laterality Date   ABDOMINAL HYSTERECTOMY     CARDIAC CATHETERIZATION N/A 03/23/2016   Procedure: Left Heart Cath and Coronary Angiography;  Surgeon: Lennette Bihari, MD;  Location: [facility] INVASIVE CV LAB;  Service: Cardiovascular;  Laterality: N/A;   CHOLECYSTECTOMY     VARICOSE VEIN SURGERY     Family History  Problem Relation Age of Onset   Cancer Mother    Heart attack Father    Social History   Socioeconomic History   Marital status: Widowed    Spouse name: Not on file   Number of children: 2   Years of education: Not on file   Highest education level: Not on file  Occupational History   Not on file  Tobacco Use   Smoking status: Never   Smokeless tobacco: Never  Vaping Use   Vaping status: Never Used  Substance and Sexual Activity  Alcohol use: No   Drug use: No   Sexual activity: Not on file  Other Topics Concern   Not on file  Social History Narrative   Not on file   Social Determinants of Health   Financial Resource Strain: Low Risk  (03/24/2020)   Received from [facility]   Overall Financial Resource Strain (CARDIA)    Difficulty of Paying Living Expenses: Not hard at all  Food Insecurity: No Food Insecurity (03/24/2020)   [facility]   Hunger Vital Sign    Worried About Running Out of Food in the Last Year: Never true    Ran Out of Food in the Last Year: Never true  Transportation Needs: No Transportation Needs (03/24/2020)   Received from [facility] - Transportation    Lack of Transportation (Medical): No    Lack of Transportation (Non-Medical): No  Physical Activity: Not on file  Stress: Not on file  Social Connections: Unknown (03/24/2020)   Received from [facility]   Social Connection and Isolation Panel  [NHANES]    Frequency of Communication with Friends and Family: More than three times a week    Frequency of Social Gatherings with Friends and Family: More than three times a week    Attends Religious Services: Not on file    Active Member of Clubs or Organizations: Not on file    Attends Banker Meetings: Not on file    Marital Status: Not on file   Allergies  Allergen Reactions   Codeine Nausea And Vomiting   Penicillins Nausea And Vomiting    Medications  (Not in a hospital admission)    Vitals   Vitals:   04/04/23 1115 04/04/23 1123 04/04/23 1124 04/04/23 1127  BP:  (!) 170/108  (!) 190/89  Pulse: 75  69 70  Resp: (!) 21 18 19 19   Temp:      TempSrc:      SpO2: 96%  96% 99%  Weight:      Height:         Body mass index is 23.48 kg/m.  Physical Exam  General: Awake alert in no distress HEENT: Normocephalic atraumatic Lungs: Clear Cardiovascular: Regular rate rhythm Abdomen nondistended nontender Neurological exam She is drowsy but opens eyes to voice. She is able to tell me her name, her age but not the current month. She is able to follow simple commands for me. Her speech is very dysarthric Cranial nerves: Pupils are equal round react light, she has a forced left gaze with no blink to threat from the right, right lower facial complete weakness at rest and on attempting to smile. Motor examination reveals flaccid right upper extremity, 2-3/5 right lower extremity.  Full strength in the left. Sensation also diminished on the left with no extinction. Coordination intact NIH stroke scale 19 as above  Labs   CBC:  Recent Labs  Lab 04/04/23 1005 04/04/23 1018  WBC 7.5  --   NEUTROABS 6.2  --   HGB 13.2 13.6  HCT 40.0 40.0  MCV 87.7  --   PLT 140*  --     Basic Metabolic Panel:  Lab Results  Component Value Date   NA 140 04/04/2023   K 3.9 04/04/2023   CO2 24 04/04/2023   GLUCOSE 165 (H) 04/04/2023   BUN 12 04/04/2023   CREATININE  0.80 04/04/2023   CALCIUM 8.8 (L) 04/04/2023   GFRNONAA >60 04/04/2023  GFRAA >60 02/18/2016   Lipid Panel:  Lab Results  Component Value Date   LDLCALC  12/12/2007    96        Total Cholesterol/HDL:CHD Risk Coronary Heart Disease Risk Table                     Men   Women  1/2 Average Risk   3.4   3.3   Urine Drug Screen:     Component Value Date/Time   LABOPIA NONE DETECTED 04/04/2023 1122   COCAINSCRNUR NONE DETECTED 04/04/2023 1122   LABBENZ NONE DETECTED 04/04/2023 1122   AMPHETMU NONE DETECTED 04/04/2023 1122   THCU NONE DETECTED 04/04/2023 1122   LABBARB NONE DETECTED 04/04/2023 1122    Alcohol Level     Component Value Date/Time   ETH <10 04/04/2023 1005   INR  Lab Results  Component Value Date   INR 1.1 04/04/2023   APTT  Lab Results  Component Value Date   APTT 24 04/04/2023     CT Head without contrast(Personally reviewed): Code stroke CT head at [facility] 10  CT angio Head and Neck with contrast plus perfusion (Personally reviewed): Emergent LVO-left M1.  146 cc penumbra on perfusion. Incidental finding-2 mm left SCA aneurysm.  Interstitial edema in the apical lungs  Repeat CT head personally reviewed at [facility] bleed.  Assessment   [name] is a 82 y.o. female with past medical history as above in the HPI presenting for evaluation of sudden onset of right-sided weakness, right facial droop, slurred speech and aphasia, with symptoms pointing towards a left hemispheric stroke.  CT head with no bleed or evidence acute infarction.  CT angio with left M1 occlusion.  CT perfusion study with favorable profile as above Dr. Shelbie Hutching evaluated the patient on telestroke-consent obtained from the family-son. 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 MRI A1c Lipid panel 2D echo Gentle hydration with normal saline at 75 cc an hour Therapy assessments when able to PCCM consult if remains intubated Possible aspiration pneumonia given CT angio findings-will need chest x-ray and further follow-up Stroke team to follow -- Milon Dikes, MD Neurologist [facility]

## 2023-04-04 NOTE — ED Notes (Signed)
2L oxygen via Nasal cannula placed on patient per MD order.

## 2023-04-04 NOTE — Anesthesia Postprocedure Evaluation (Signed)
Anesthesia Post Note  Patient: [name]  Procedure(s) Performed: IR WITH ANESTHESIA     Patient location during evaluation: PACU Anesthesia Type: General Level of consciousness: awake and alert Pain management: pain level controlled Vital Signs Assessment: post-procedure vital signs reviewed and stable Respiratory status: spontaneous breathing, nonlabored ventilation, respiratory function stable and patient connected to nasal cannula oxygen Cardiovascular status: blood pressure returned to baseline and stable Postop Assessment: no apparent nausea or vomiting Anesthetic complications: no  No notable events documented.  Last Vitals:  Vitals:   04/04/23 1535 04/04/23 1605  BP: 136/75 (!) 145/48  Pulse: 82 75  Resp: 16 15  Temp:    SpO2: 96% 99%    Last Pain:  Vitals:   04/04/23 1605  TempSrc:   PainSc: 0-No pain                 Kennieth Rad

## 2023-04-04 NOTE — Anesthesia Procedure Notes (Signed)
Arterial Line Insertion Start/End10/20/2024 12:37 PM, 04/04/2023 12:42 PM Performed by: Marcene Duos, MD, anesthesiologist  Patient location: Pre-op. Preanesthetic checklist: patient identified, IV checked, site marked, risks and benefits discussed, surgical consent, monitors and equipment checked, pre-op evaluation, timeout performed and anesthesia consent Lidocaine 1% used for infiltration Left, radial was placed Catheter size: 20 G Hand hygiene performed  and maximum sterile barriers used   Attempts: 2 Procedure performed without using ultrasound guided technique. Following insertion, dressing applied and Biopatch. Post procedure assessment: normal and unchanged  Post procedure complications: unsuccessful attempts. Patient tolerated the procedure well with no immediate complications.

## 2023-04-04 NOTE — ED Provider Notes (Signed)
[place] EMERGENCY DEPARTMENT AT [facility] Provider Note  CSN: [id number] Arrival date & time: 04/04/23 1008  Chief Complaint(s) Weakness and Code Stroke  HPI [name] is a 82 y.o. female with past medical history as below, significant for CAD, HTN, LBBB, HLD who presents to the ED with complaint of STROKE ALERT  LKN around 7pm last night, found on floor this AM around 0900 by son. Reduced responsiveness, speech change, right sided weakness, elevated BP, vomiting en route. No thinners.   Past Medical History Past Medical History:  Diagnosis Date   Chest pain    Coronary artery disease    Hypertension    LBBB (left bundle branch block)    Patient Active Problem List   Diagnosis Date Noted   Varicose veins of bilateral lower extremities with other complications 05/31/2017   Precordial pain    Angina decubitus (HCC)    LBBB 07/18/2009   CAD in native artery 06/18/2009   HYPERLIPIDEMIA 02/25/2009   Essential hypertension 02/25/2009   LEG EDEMA 02/25/2009   Home Medication(s) Prior to Admission medications   Medication Sig Start Date End Date Taking? Authorizing Provider  acetaminophen (TYLENOL) 500 MG tablet Take 500 mg by mouth every 6 (six) hours as needed for mild pain.    [provider]  aspirin EC 81 MG tablet Take 1 tablet (81 mg total) by mouth daily. 03/20/16   Lewayne Bunting, MD  atorvastatin (LIPITOR) 40 MG tablet Take 1 tablet (40 mg total) by mouth daily. 03/20/16 06/18/16  Lewayne Bunting, MD  cephALEXin (KEFLEX) 500 MG capsule  05/11/17   [provider]  Cholecalciferol (D-3-5) 5000 units capsule Take 5,000 Units by mouth daily.    [provider]  cloNIDine (CATAPRES) 0.1 MG tablet Take 1 tablet (0.1 mg total) by mouth 2 (two) times daily. 03/11/17   Lewayne Bunting, MD  fluconazole (DIFLUCAN) 100 MG tablet  05/12/17   [provider]  furosemide (LASIX) 40 MG tablet Take 40 mg by mouth daily.     [provider]  HYDROcodone-acetaminophen (NORCO/VICODIN) 5-325 MG tablet Take 1 tablet by mouth every 6 (six) hours as needed for moderate pain.    [provider]  losartan (COZAAR) 100 MG tablet Take 100 mg by mouth daily.    [provider]  metoprolol succinate (TOPROL-XL) 100 MG 24 hr tablet Take 100 mg by mouth daily. Take with or immediately following a meal.    [provider]                                                                                                                                    Past Surgical History Past Surgical History:  Procedure Laterality Date   ABDOMINAL HYSTERECTOMY     CARDIAC CATHETERIZATION N/A 03/23/2016   Procedure: Left Heart Cath and Coronary Angiography;  Surgeon: Lennette Bihari, MD;  Location: [facility]  INVASIVE CV LAB;  Service: Cardiovascular;  Laterality: N/A;   CHOLECYSTECTOMY     VARICOSE VEIN SURGERY     Family History Family History  Problem Relation Age of Onset   Cancer Mother    Heart attack Father     Social History Social History   Tobacco Use   Smoking status: Never   Smokeless tobacco: Never  Vaping Use   Vaping status: Never Used  Substance Use Topics   Alcohol use: No   Drug use: No   Allergies Codeine and Penicillins  Review of Systems Review of Systems  Unable to perform ROS: Acuity of condition  Gastrointestinal:  Positive for vomiting.  Neurological:  Positive for weakness.    Physical Exam Vital Signs  I have reviewed the triage vital signs BP (!) 187/86   Pulse 63   Temp 97.6 F (36.4 C) (Oral)   Resp 16   Ht 5\' 5"  (1.651 m)   Wt 64 kg   SpO2 97%   BMI 23.48 kg/m  Physical Exam Constitutional:      General: She is in acute distress.     Appearance: She is ill-appearing.  HENT:     Head: Normocephalic and atraumatic.     Right Ear: External ear normal.     Left Ear: External ear normal.     Nose: Nose normal.     Mouth/Throat:     Mouth: Mucous  membranes are dry.  Eyes:     General: No scleral icterus.       Right eye: No discharge.        Left eye: No discharge.  Cardiovascular:     Rate and Rhythm: Normal rate and regular rhythm.     Pulses: Normal pulses.  Pulmonary:     Effort: Pulmonary effort is normal. No respiratory distress.     Breath sounds: Normal breath sounds.  Abdominal:     General: Abdomen is flat. There is no distension.     Palpations: Abdomen is soft.  Musculoskeletal:     Right lower leg: No edema.     Left lower leg: No edema.  Skin:    General: Skin is warm.     Coloration: Skin is not jaundiced.  Neurological:     Mental Status: She is alert.     GCS: GCS eye subscore is 4. GCS verbal subscore is 4. GCS motor subscore is 6.     Cranial Nerves: Dysarthria and facial asymmetry present.     Sensory: Sensory deficit present.     Motor: Weakness present.     Comments: Gait testing deferred secondary to patient safety.  unilateral weakness noted  Unable to complete coordination testing 2/2 extremity weakness       ED Results and Treatments Labs (all labs ordered are listed, but only abnormal results are displayed) Labs Reviewed  CBC - Abnormal; Notable for the following components:      Result Value   Platelets 140 (*)    All other components within normal limits  COMPREHENSIVE METABOLIC PANEL - Abnormal; Notable for the following components:   Glucose, Bld 169 (*)    Calcium 8.8 (*)    All other components within normal limits  GLUCOSE, CAPILLARY - Abnormal; Notable for the following components:   Glucose-Capillary 145 (*)    All other components within normal limits  BRAIN NATRIURETIC PEPTIDE - Abnormal; Notable for the following components:   B Natriuretic Peptide 228.0 (*)    All other components  within normal limits  POCT I-STAT, CHEM 8 - Abnormal; Notable for the following components:   Glucose, Bld 165 (*)    Calcium, Ion 1.08 (*)    All other components within normal limits   RESP PANEL BY RT-PCR (RSV, FLU A&B, COVID)  RVPGX2  ETHANOL  PROTIME-INR  APTT  DIFFERENTIAL  CK  AMMONIA  TSH  RAPID URINE DRUG SCREEN, HOSP PERFORMED  URINALYSIS, ROUTINE W REFLEX MICROSCOPIC  T4, FREE  I-STAT CHEM 8, ED  TROPONIN I (HIGH SENSITIVITY)                                                                                                                          Radiology CT ANGIO HEAD NECK W WO CM W PERF (CODE STROKE)  Result Date: 04/04/2023 CLINICAL DATA:  Right-sided weakness EXAM: CT ANGIOGRAPHY HEAD AND NECK CT PERFUSION BRAIN TECHNIQUE: Multidetector CT imaging of the head and neck was performed using the standard protocol during bolus administration of intravenous contrast. Multiplanar CT image reconstructions and MIPs were obtained to evaluate the vascular anatomy. Carotid stenosis measurements (when applicable) are obtained utilizing NASCET criteria, using the distal internal carotid diameter as the denominator. Multiphase CT imaging of the brain was performed following IV bolus contrast injection. Subsequent parametric perfusion maps were calculated using RAPID software. RADIATION DOSE REDUCTION: This exam was performed according to the departmental dose-optimization program which includes automated exposure control, adjustment of the mA and/or kV according to patient size and/or use of iterative reconstruction technique. CONTRAST:  OMNIPAQUE IOHEXOL 350 MG/ML SOLN COMPARISON:  Earlier today FINDINGS: CTA NECK FINDINGS Aortic arch: Atherosclerosis. Right carotid system: Mixed density plaque at the bifurcation and bulb. No flow reducing stenosis or ulceration Left carotid system: Atheromatous plaque greatest at the bifurcation. No flow reducing stenosis or ulceration. Vertebral arteries: No proximal subclavian stenosis. Atheromatous narrowing of the proximal right vertebral artery causing at least 50% stenosis. No ulceration or beading. Skeleton: No acute finding Other  neck: No acute finding Upper chest: Interlobular septal thickening at the apices. Review of the MIP images confirms the above findings CTA HEAD FINDINGS Anterior circulation: Left M1 occlusion with abrupt cut off consistent with emergent embolic disease. There is downstream underfilling. Atheromatous plaque affects the carotid siphons without flow reducing stenosis. Negative for aneurysm Posterior circulation: Fenestrated proximal basilar. The vertebral and basilar arteries are diffusely patent. No branch occlusion, beading, or vascular malformation. Atheromatous irregularity of the posterior cerebral arteries with moderate narrowing at the left P2 segment. 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 Venous sinuses: Diffusely patent Anatomic variants: None significant Review of the MIP images confirms the above findings CT Brain Perfusion Findings: ASPECTS: 10 CBF (<30%) Volume: 0mL Perfusion (Tmax>6.0s) volume: Case discussed with Dr. Otelia Limes while study was in progress. IMPRESSION: 1. Emergent large vessel occlusion at the left M1 segment. There is 146 cc of penumbra and no core infarct by CT perfusion. 2. No flow reducing stenosis or embolic source seen  underlying the embolus. 3. Atherosclerosis with flow reducing stenosis at the right vertebral origin. Notable atheromatous irregularity of the posterior cerebral arteries. 4. 2 mm left superior cerebellar aneurysm. 5. Interstitial edema in the apical lungs. Electronically Signed   By: Tiburcio Pea M.D.   On: 04/04/2023 11:00   CT HEAD CODE STROKE WO CONTRAST  Result Date: 04/04/2023 CLINICAL DATA:  Code stroke. Right upper extremity weakness. Aphasia. EXAM: CT HEAD WITHOUT CONTRAST TECHNIQUE: Contiguous axial images were obtained from the base of the skull through the vertex without intravenous contrast. RADIATION DOSE REDUCTION: This exam was performed according to the departmental dose-optimization program which includes automated exposure  control, adjustment of the mA and/or kV according to patient size and/or use of iterative reconstruction technique. COMPARISON:  None Available. FINDINGS: Brain: No evidence of acute infarction, hemorrhage, hydrocephalus, extra-axial collection or mass lesion/mass effect. Mild generalized brain atrophy. Mild chronic small vessel ischemia in the deep cerebral white matter. Vascular: No hyperdense vessel or unexpected calcification. Skull: Normal. Negative for fracture or focal lesion. Sinuses/Orbits: No acute finding. Other: Prelim sent in epic chat. ASPECTS [facility] Stroke Program Early CT Score) - Ganglionic level infarction (caudate, lentiform nuclei, internal capsule, insula, M1-M3 cortex): 7 - Supraganglionic infarction (M4-M6 cortex): 3 Total score (0-10 with 10 being normal): 10 IMPRESSION: Aging brain without acute finding. Electronically Signed   By: Tiburcio Pea M.D.   On: 04/04/2023 10:22    Pertinent labs & imaging results that were available during my care of the patient were reviewed by me and considered in my medical decision making (see MDM for details).  Medications Ordered in ED Medications  sodium chloride 0.9 % bolus 500 mL (500 mLs Intravenous Bolus 04/04/23 1052)    Followed by  0.9 %  sodium chloride infusion (100 mL/hr Intravenous New Bag/Given 04/04/23 1053)  iohexol (OMNIPAQUE) 350 MG/ML injection 100 mL (100 mLs Intravenous Contrast Given 04/04/23 1052)                                                                                                                                     Procedures .Critical Care  Performed by: Sloan Leiter, DO Authorized by: Sloan Leiter, DO   Critical care provider statement:    Critical care time (minutes):  70   Critical care time was exclusive of:  Separately billable procedures and treating other patients   Critical care was necessary to treat or prevent imminent or life-threatening deterioration of the following conditions:  CNS  failure or compromise   Critical care was time spent personally by me on the following activities:  Development of treatment plan with patient or surrogate, discussions with consultants, evaluation of patient's response to treatment, examination of patient, ordering and review of laboratory studies, ordering and review of radiographic studies, ordering and performing treatments and interventions, pulse oximetry, re-evaluation of patient's condition, review of old charts and obtaining history from patient  or surrogate   Care discussed with: admitting provider     (including critical care time)  Medical Decision Making / ED Course    Medical Decision Making:    KAISLYNN PIGUE is a 82 y.o. female with past medical history as below, significant for CAD, HTN, LBBB, HLD who presents to the ED with complaint of STROKE ALERT. The complaint involves an extensive differential diagnosis and also carries with it a high risk of complications and morbidity.  Serious etiology was considered. Ddx includes but is not limited to: Differential diagnoses for altered mental status includes but is not exclusive to alcohol, illicit or prescription medications, intracranial pathology such as stroke, intracerebral hemorrhage, fever or infectious causes including sepsis, hypoxemia, uremia, trauma, endocrine related disorders such as diabetes, hypoglycemia, thyroid-related diseases, etc.   Complete initial physical exam performed, notably the patient  was focal deficit noted on arrival, stroke alert activated en route by EMS, stroke protocol started in ED, airway cleared by myself .    Reviewed and confirmed nursing documentation for past medical history, family history, social history.  Vital signs reviewed.    Clinical Course as of 04/04/23 1121  Sun Apr 04, 2023  1012 LKN around 7pm last night. She is VAN positive, proceed code stroke  [SG]  1019 EMS rhythm strip reviewed, occ pvc's noted, no STEMI [SG]  1019 No  hypoglycemia on CBG [SG]    Clinical Course User Index [SG] Tanda Rockers A, DO    Last known normal approximately 7 PM last night, she is VAN positive, does not meet criteria for TNK given duration of symptoms.  Proceed with imaging given she is VAN positive  CT head on wet read does not appear to have large-volume ICH; agree w/ radiologist CT angio appears to have MCA occlusion on wet read Discussed with neurology Dr. Otelia Limes, code IR activated, will send to [facility] emergently for thrombectomy.  Discussed with family at bedside who are agreeable to plan.  All questions answered.  [facility] is en route Patient remains hemodynamically stable, airway is intact                 Additional history obtained: -Additional history obtained from family, ems -External records from outside source obtained and reviewed including: Chart review including previous notes, labs, imaging, consultation notes including  Home medications, no thinners    Lab Tests: -I ordered, reviewed, and interpreted labs.   The pertinent results include:   Labs Reviewed  CBC - Abnormal; Notable for the following components:      Result Value   Platelets 140 (*)    All other components within normal limits  COMPREHENSIVE METABOLIC PANEL - Abnormal; Notable for the following components:   Glucose, Bld 169 (*)    Calcium 8.8 (*)    All other components within normal limits  GLUCOSE, CAPILLARY - Abnormal; Notable for the following components:   Glucose-Capillary 145 (*)    All other components within normal limits  BRAIN NATRIURETIC PEPTIDE - Abnormal; Notable for the following components:   B Natriuretic Peptide 228.0 (*)    All other components within normal limits  POCT I-STAT, CHEM 8 - Abnormal; Notable for the following components:   Glucose, Bld 165 (*)    Calcium, Ion 1.08 (*)    All other components within normal limits  RESP PANEL BY RT-PCR (RSV, FLU A&B, COVID)  RVPGX2  ETHANOL   PROTIME-INR  APTT  DIFFERENTIAL  CK  AMMONIA  TSH  RAPID URINE DRUG SCREEN, HOSP PERFORMED  URINALYSIS, ROUTINE W REFLEX MICROSCOPIC  T4, FREE  I-STAT CHEM 8, ED  TROPONIN I (HIGH SENSITIVITY)    Notable for no hypoglycemia  EKG   EKG Interpretation Date/Time:  Sunday April 04 2023 10:52:10 EDT Ventricular Rate:  69 PR Interval:  147 QRS Duration:  146 QT Interval:  445 QTC Calculation: 477 R Axis:   81  Text Interpretation: Wandering atrial pacemaker Multiple premature complexes, vent & supraven LVH with secondary repolarization abnormality Anterior infarct, old similar prior Confirmed by Tanda Rockers ([id number]) on 04/04/2023 11:16:56 AM         Imaging Studies ordered: I ordered imaging studies including CTH CTA CXR I independently visualized the following imaging with scope of interpretation limited to determining acute life threatening conditions related to emergency care; findings noted above >> LVO I independently visualized and interpreted imaging. I agree with the radiologist interpretation   Medicines ordered and prescription drug management: Meds ordered this encounter  Medications   FOLLOWED BY Linked Order Group    sodium chloride 0.9 % bolus 500 mL    0.9 %  sodium chloride infusion   iohexol (OMNIPAQUE) 350 MG/ML injection 100 mL    -I have reviewed the patients home medicines and have made adjustments as needed   Consultations Obtained: I requested consultation with the neurology/stroke team,  and discussed lab and imaging findings as well as pertinent plan - they recommend: IR   Cardiac Monitoring: The patient was maintained on a cardiac monitor.  I personally viewed and interpreted the cardiac monitored which showed an underlying rhythm of: LBBB, sinus rhythm Continuous pulse oximetry interpreted by myself, 98% on RA.    Social Determinants of Health:  Diagnosis or treatment significantly limited by social determinants of health: lives at  home   Reevaluation: After the interventions noted above, I reevaluated the patient and found that they have stayed the same  Co morbidities that complicate the patient evaluation  Past Medical History:  Diagnosis Date   Chest pain    Coronary artery disease    Hypertension    LBBB (left bundle branch block)       Dispostion: Disposition decision including need for hospitalization was considered, and patient admitted to the hospital.    Final Clinical Impression(s) / ED Diagnoses Final diagnoses:  Cerebrovascular accident (CVA), unspecified mechanism (HCC)  Weakness        Sloan Leiter, DO 04/04/23 1121

## 2023-04-04 NOTE — ED Notes (Signed)
Patient clothing's put in patient bag and given to son.

## 2023-04-04 NOTE — ED Triage Notes (Signed)
Pt BIB [facility], paged out at 0957. Pt found in floor by son 0915 last known well 1900 on 04/03/2023.

## 2023-04-04 NOTE — Anesthesia Preprocedure Evaluation (Signed)
Anesthesia Evaluation  Patient identified by MRN, date of birth, ID band Patient awake    Reviewed: Allergy & Precautions, NPO status , Patient's Chart, lab work & pertinent test resultsPreop documentation limited or incomplete due to emergent nature of procedure.  Airway Mallampati: II  TM Distance: >3 FB Neck ROM: Full    Dental  (+) Dental Advisory Given   Pulmonary neg pulmonary ROS   breath sounds clear to auscultation       Cardiovascular hypertension, Pt. on medications and Pt. on home beta blockers + CAD  + dysrhythmias  Rhythm:Regular Rate:Normal     Neuro/Psych CVA    GI/Hepatic negative GI ROS, Neg liver ROS,,,  Endo/Other  negative endocrine ROS    Renal/GU negative Renal ROS     Musculoskeletal   Abdominal   Peds  Hematology negative hematology ROS (+)   Anesthesia Other Findings   Reproductive/Obstetrics                             Anesthesia Physical Anesthesia Plan  ASA: 4 and emergent  Anesthesia Plan: General   Post-op Pain Management:    Induction: Intravenous and Rapid sequence  PONV Risk Score and Plan: 3 and Dexamethasone, Ondansetron and Treatment may vary due to age or medical condition  Airway Management Planned: Oral ETT  Additional Equipment: Arterial line  Intra-op Plan:   Post-operative Plan: Possible Post-op intubation/ventilation  Informed Consent: I have reviewed the patients History and Physical, chart, labs and discussed the procedure including the risks, benefits and alternatives for the proposed anesthesia with the patient or authorized representative who has indicated his/her understanding and acceptance.     Dental advisory given  Plan Discussed with:   Anesthesia Plan Comments:        Anesthesia Quick Evaluation

## 2023-04-04 NOTE — Transfer of Care (Signed)
Immediate Anesthesia Transfer of Care Note  Patient: [name]  Procedure(s) Performed: IR WITH ANESTHESIA  Patient Location: PACU  Anesthesia Type:General  Level of Consciousness: awake  Airway & Oxygen Therapy: Patient Spontanous Breathing and Patient connected to face mask oxygen  Post-op Assessment: Report given to RN and Post -op Vital signs reviewed and stable  Post vital signs: Reviewed and stable  Last Vitals:  Vitals Value Taken Time  BP    Temp    Pulse    Resp    SpO2      Last Pain:  Vitals:   04/04/23 1100  TempSrc:   PainSc: 0-No pain         Complications: No notable events documented.

## 2023-04-04 NOTE — ED Notes (Signed)
1048 Dr Georg Ruddle spoke with son and grandsons at bedside about IR, discussed risks/benefits and asked if they would consent to IR. Family wishes to discuss. 1050 Dr Otelia Limes speaks to RAD and CTA/P shows L M1 occlusion 1056 Dr Otelia Limes calls Dr Corliss Skains, Maryln Gottron at [facility] and discusses read, lets him know family is discussing consent for IR 1103 Family consents to IR 1104 Dr Otelia Limes calls Code IR 1116 Transport at bedside 1118 pt leaves for [employer]

## 2023-04-05 ENCOUNTER — Inpatient Hospital Stay (HOSPITAL_COMMUNITY): Payer: Medicare Other

## 2023-04-05 ENCOUNTER — Encounter (HOSPITAL_COMMUNITY): Payer: Self-pay | Admitting: Radiology

## 2023-04-05 ENCOUNTER — Other Ambulatory Visit (HOSPITAL_COMMUNITY): Payer: Self-pay

## 2023-04-05 DIAGNOSIS — I5022 Chronic systolic (congestive) heart failure: Secondary | ICD-10-CM | POA: Diagnosis not present

## 2023-04-05 DIAGNOSIS — I6602 Occlusion and stenosis of left middle cerebral artery: Secondary | ICD-10-CM

## 2023-04-05 DIAGNOSIS — I4819 Other persistent atrial fibrillation: Secondary | ICD-10-CM | POA: Diagnosis not present

## 2023-04-05 DIAGNOSIS — I6389 Other cerebral infarction: Secondary | ICD-10-CM

## 2023-04-05 LAB — LIPID PANEL
Cholesterol: 143 mg/dL (ref 0–200)
HDL: 46 mg/dL (ref >40)
LDL Cholesterol: 87 mg/dL (ref 0–99)
Total CHOL/HDL Ratio: 3.1 {ratio}
Triglycerides: 49 mg/dL (ref <150)
VLDL: 10 mg/dL (ref 0–40)

## 2023-04-05 LAB — CBC WITH DIFFERENTIAL/PLATELET
Abs Immature Granulocytes: 0.03 10*3/uL (ref 0.00–0.07)
Basophils Absolute: 0 10*3/uL (ref 0.0–0.1)
Basophils Relative: 0 %
Eosinophils Absolute: 0 10*3/uL (ref 0.0–0.5)
Eosinophils Relative: 0 %
HCT: 34.8 % — ABNORMAL LOW (ref 36.0–46.0)
Hemoglobin: 11.4 g/dL — ABNORMAL LOW (ref 12.0–15.0)
Immature Granulocytes: 0 %
Lymphocytes Relative: 12 %
Lymphs Abs: 1.2 10*3/uL (ref 0.7–4.0)
MCH: 28.1 pg (ref 26.0–34.0)
MCHC: 32.8 g/dL (ref 30.0–36.0)
MCV: 85.9 fL (ref 80.0–100.0)
Monocytes Absolute: 0.4 10*3/uL (ref 0.1–1.0)
Monocytes Relative: 4 %
Neutro Abs: 7.9 10*3/uL — ABNORMAL HIGH (ref 1.7–7.7)
Neutrophils Relative %: 84 %
Platelets: 130 10*3/uL — ABNORMAL LOW (ref 150–400)
RBC: 4.05 MIL/uL (ref 3.87–5.11)
RDW: 12.7 % (ref 11.5–15.5)
WBC: 9.5 10*3/uL (ref 4.0–10.5)
nRBC: 0 % (ref 0.0–0.2)

## 2023-04-05 LAB — ECHOCARDIOGRAM COMPLETE
AR max vel: 1.82 cm2
AV Peak grad: 9.9 mm[Hg]
Ao pk vel: 1.57 m/s
Area-P 1/2: 5.02 cm2
Height: 65 in
MV M vel: 3.61 m/s
MV Peak grad: 52 mm[Hg]
S' Lateral: 3.7 cm
Weight: 2257.51 [oz_av]

## 2023-04-05 LAB — GLUCOSE, CAPILLARY
Glucose-Capillary: 101 mg/dL — ABNORMAL HIGH (ref 70–99)
Glucose-Capillary: 129 mg/dL — ABNORMAL HIGH (ref 70–99)
Glucose-Capillary: 155 mg/dL — ABNORMAL HIGH (ref 70–99)
Glucose-Capillary: 138 mg/dL — ABNORMAL HIGH (ref 70–99)
Glucose-Capillary: 141 mg/dL — ABNORMAL HIGH (ref 70–99)
Glucose-Capillary: 131 mg/dL — ABNORMAL HIGH (ref 70–99)

## 2023-04-05 LAB — BASIC METABOLIC PANEL
Anion gap: 9 (ref 5–15)
BUN: 8 mg/dL (ref 8–23)
CO2: 21 mmol/L — ABNORMAL LOW (ref 22–32)
Calcium: 8.9 mg/dL (ref 8.9–10.3)
Chloride: 109 mmol/L (ref 98–111)
Creatinine, Ser: 0.79 mg/dL (ref 0.44–1.00)
GFR, Estimated: >60 mL/min (ref >60)
Glucose, Bld: 111 mg/dL — ABNORMAL HIGH (ref 70–99)
Potassium: 3.4 mmol/L — ABNORMAL LOW (ref 3.5–5.1)
Sodium: 139 mmol/L (ref 135–145)

## 2023-04-05 LAB — MAGNESIUM: Magnesium: 1.7 mg/dL (ref 1.7–2.4)

## 2023-04-05 LAB — PHOSPHORUS: Phosphorus: 2.5 mg/dL (ref 2.5–4.6)

## 2023-04-05 LAB — HEPARIN LEVEL (UNFRACTIONATED): Heparin Unfractionated: 0.11 [IU]/mL — ABNORMAL LOW (ref 0.30–0.70)

## 2023-04-05 MED ORDER — POTASSIUM & SODIUM PHOSPHATES 280-160-250 MG PO PACK
2.0000 | PACK | Freq: Once | ORAL | Status: AC
  Administered 2023-04-05: 2 via ORAL
  Filled 2023-04-05: qty 2

## 2023-04-05 MED ORDER — PANTOPRAZOLE SODIUM 40 MG PO TBEC
40.0000 mg | DELAYED_RELEASE_TABLET | Freq: Every day | ORAL | Status: AC
  Administered 2023-04-05 – 2023-04-06 (×2): 40 mg via ORAL
  Filled 2023-04-05 (×2): qty 1

## 2023-04-05 MED ORDER — MAGNESIUM SULFATE 2 GM/50ML IV SOLN
2.0000 g | Freq: Once | INTRAVENOUS | Status: AC
  Administered 2023-04-05: 2 g via INTRAVENOUS
  Filled 2023-04-05: qty 50

## 2023-04-05 MED ORDER — HYDRALAZINE HCL 20 MG/ML IJ SOLN
10.0000 mg | INTRAMUSCULAR | Status: DC | PRN
  Administered 2023-04-05 – 2023-04-10 (×8): 10 mg via INTRAVENOUS
  Filled 2023-04-05 (×7): qty 1

## 2023-04-05 MED ORDER — LABETALOL HCL 5 MG/ML IV SOLN
10.0000 mg | INTRAVENOUS | Status: DC | PRN
  Administered 2023-04-05: 10 mg via INTRAVENOUS
  Filled 2023-04-05: qty 4

## 2023-04-05 MED ORDER — ORAL CARE MOUTH RINSE: 15.0000 mL | OROMUCOSAL | Status: DC | PRN

## 2023-04-05 MED ORDER — ATORVASTATIN CALCIUM 80 MG PO TABS
80.0000 mg | ORAL_TABLET | Freq: Every day | ORAL | Status: DC
  Administered 2023-04-05 – 2023-04-12 (×8): 80 mg via ORAL
  Filled 2023-04-05 (×8): qty 1

## 2023-04-05 MED ORDER — LOSARTAN POTASSIUM 50 MG PO TABS
100.0000 mg | ORAL_TABLET | Freq: Every day | ORAL | Status: DC
  Administered 2023-04-05 – 2023-04-12 (×8): 100 mg via ORAL
  Filled 2023-04-05 (×8): qty 2

## 2023-04-05 MED ORDER — POTASSIUM CHLORIDE 20 MEQ PO PACK: 40.0000 meq | PACK | Freq: Once | ORAL | Status: DC

## 2023-04-05 MED ORDER — HEPARIN (PORCINE) 25000 UT/250ML-% IV SOLN
1050.0000 [IU]/h | INTRAVENOUS | Status: DC
Start: 2023-04-05 — End: 2023-04-08
  Administered 2023-04-05: 800 [IU]/h via INTRAVENOUS
  Administered 2023-04-06: 950 [IU]/h via INTRAVENOUS
  Administered 2023-04-06: 1050 [IU]/h via INTRAVENOUS
  Administered 2023-04-07: 1100 [IU]/h via INTRAVENOUS
  Filled 2023-04-05 (×3): qty 250

## 2023-04-05 MED ORDER — METOPROLOL SUCCINATE ER 50 MG PO TB24
50.0000 mg | ORAL_TABLET | Freq: Every day | ORAL | Status: DC
  Administered 2023-04-05: 50 mg via ORAL
  Filled 2023-04-05 (×2): qty 1

## 2023-04-05 MED ORDER — POTASSIUM CHLORIDE 20 MEQ PO PACK
40.0000 meq | PACK | Freq: Once | ORAL | Status: AC
  Administered 2023-04-05: 40 meq via ORAL
  Filled 2023-04-05: qty 2

## 2023-04-05 NOTE — Progress Notes (Signed)
PHARMACY - ANTICOAGULATION CONSULT NOTE  Pharmacy Consult for heparin Indication: atrial fibrillation and stroke  Allergies  Allergen Reactions   Codeine Nausea And Vomiting   Penicillins Nausea And Vomiting    Patient Measurements: Height: 5\' 5"  (165.1 cm) Weight: 64 kg (141 lb 1.5 oz) IBW/kg (Calculated) : 57 Heparin Dosing Weight: 64  Vital Signs: Temp: 99.7 F (37.6 C) (10/21 2000) Temp Source: Oral (10/21 2000) BP: 154/66 (10/21 2100) Pulse Rate: 57 (10/21 2100)  Labs: Recent Labs    04/04/23 1005 04/04/23 1018 04/04/23 1710 04/05/23 0755 04/05/23 2102  HGB 13.2 13.6  --  11.4*  --   HCT 40.0 40.0  --  34.8*  --   PLT 140*  --   --  130*  --   APTT 24  --   --   --   --   LABPROT 14.2  --   --   --   --   INR 1.1  --   --   --   --   HEPARINUNFRC  --   --   --   --  0.11*  CREATININE 0.88 0.80  --  0.79  --   CKTOTAL 78  --   --   --   --   TROPONINIHS 10  --  28*  --   --     Estimated Creatinine Clearance: 48.8 mL/min (by C-G formula based on SCr of 0.79 mg/dL).   Medical History: Past Medical History:  Diagnosis Date   Chest pain    Coronary artery disease    Hypertension    LBBB (left bundle branch block)     Assessment: 82yoF who admitted on 10/20 as code stroke, underwent revascularization w/ NIR on 10/20 1419 given presentation with LVO of M1. Patient with new aflutter, requiring anticoagulation given cardioembolic etiology. CBC relatively stable this AM from admission.  Heparin level subtherapeutic (0.11) on infusion at 800 units/hr. No issues with line or bleeding reported per RN.  Goal of Therapy:  Heparin level 0.3-0.5 units/ml Monitor platelets by anticoagulation protocol: Yes   Plan:  Increase heparin infusion to 950 units/hr F/u 8 hr heparin level  Christoper Fabian, PharmD, BCPS Please see amion for complete clinical pharmacist phone list 04/05/2023,10:02 PM

## 2023-04-05 NOTE — Progress Notes (Signed)
Referring Physician(s): Dr. Wilford Corner Code Stroke  Supervising Physician: Julieanne Cotton  Patient Status:  [facility] - In-pt  Chief Complaint: Code stroke L MCA occlusion  Subjective: Resting comfortably. Awake, alert  Allergies: Codeine and Penicillins  Medications: Prior to Admission medications   Medication Sig Start Date End Date Taking? Authorizing Provider  acetaminophen (TYLENOL) 500 MG tablet Take 500 mg by mouth every 6 (six) hours as needed for mild pain.    [provider]  aspirin EC 81 MG tablet Take 1 tablet (81 mg total) by mouth daily. 03/20/16   Lewayne Bunting, MD  atorvastatin (LIPITOR) 40 MG tablet Take 1 tablet (40 mg total) by mouth daily. 03/20/16 06/18/16  Lewayne Bunting, MD  Cholecalciferol (D-3-5) 5000 units capsule Take 5,000 Units by mouth daily.    [provider]  cloNIDine (CATAPRES) 0.1 MG tablet Take 1 tablet (0.1 mg total) by mouth 2 (two) times daily. 03/11/17   Lewayne Bunting, MD  furosemide (LASIX) 40 MG tablet Take 40 mg by mouth daily.    [provider]  HYDROcodone-acetaminophen (NORCO/VICODIN) 5-325 MG tablet Take 1 tablet by mouth every 6 (six) hours as needed for moderate pain.    [provider]  lisinopril (ZESTRIL) 40 MG tablet Take 1 tablet by mouth once daily for 90 days    [provider]  losartan (COZAAR) 100 MG tablet Take 100 mg by mouth daily.    [provider]  metoprolol succinate (TOPROL-XL) 100 MG 24 hr tablet Take 100 mg by mouth daily. Take with or immediately following a meal.    [provider]     Vital Signs: BP (!) 136/103   Pulse 68   Temp (!) 100.5 F (38.1 C) (Oral)   Resp (!) 21   Ht 5\' 5"  (1.651 m)   Wt 141 lb 1.5 oz (64 kg)   SpO2 93%   BMI 23.48 kg/m   Physical Exam Vitals and nursing note reviewed.   NAD, alert Neuro: right facial droop, slurred speech, answering questions appropriately.  No blink to threat on the right with  right-sided neglect.  Subtle movement on the right with poor hand grip strength, 1-2/4, able to wiggle toes, but not lift right leg.  Skin: procedure site intact, clean, and dry.  No evidence of hematoma or pseudoaneurysm.  Pulses: intact, palpable bilaterally.   Imaging: ECHOCARDIOGRAM COMPLETE  Result Date: 04/05/2023    ECHOCARDIOGRAM REPORT   Patient Name:   [name] Date of Exam: 04/05/2023 Medical Rec #:  [id number]        Height:       65.0 in Accession #:    [id number]       Weight:       141.1 lb Date of Birth:  1941/06/01         BSA:          1.706 m Patient Age:    82 years         BP:           159/62 mmHg Patient Gender: F                HR:           64 bpm. Exam Location:  Inpatient Procedure: 2D Echo, Cardiac Doppler and Color Doppler Indications:    Stroke I63.9  History:        Patient has prior history of Echocardiogram examinations, most  recent 03/25/2017. Angina and CAD, Stroke, Arrythmias:LBBB,                 Signs/Symptoms:Chest Pain; Risk Factors:Hypertension and                 Dyslipidemia.  Sonographer:    Lucendia Herrlich Referring Phys: Kalman Drape WOLFE IMPRESSIONS  1. Left ventricular ejection fraction, by estimation, is 45 to 50%. The left ventricle has mildly decreased function. The left ventricle demonstrates global hypokinesis. Left ventricular diastolic function could not be evaluated.  2. Right ventricular systolic function is normal. The right ventricular size is normal.  3. Left atrial size was mildly dilated.  4. Right atrial size was mildly dilated.  5. The mitral valve is normal in structure. Trivial mitral valve regurgitation. No evidence of mitral stenosis.  6. The aortic valve is tricuspid. There is mild calcification of the aortic valve. Aortic valve regurgitation is not visualized. No aortic stenosis is present.  7. The inferior vena cava is normal in size with <50% respiratory variability, suggesting right atrial pressure of 8 mmHg.  FINDINGS  Left Ventricle: Left ventricular ejection fraction, by estimation, is 45 to 50%. The left ventricle has mildly decreased function. The left ventricle demonstrates global hypokinesis. The left ventricular internal cavity size was normal in size. There is  no left ventricular hypertrophy. Left ventricular diastolic function could not be evaluated due to atrial fibrillation. Left ventricular diastolic function could not be evaluated. Right Ventricle: The right ventricular size is normal. No increase in right ventricular wall thickness. Right ventricular systolic function is normal. Left Atrium: Left atrial size was mildly dilated. Right Atrium: Right atrial size was mildly dilated. Pericardium: There is no evidence of pericardial effusion. Mitral Valve: The mitral valve is normal in structure. Trivial mitral valve regurgitation. No evidence of mitral valve stenosis. Tricuspid Valve: The tricuspid valve is normal in structure. Tricuspid valve regurgitation is mild . No evidence of tricuspid stenosis. Aortic Valve: The aortic valve is tricuspid. There is mild calcification of the aortic valve. Aortic valve regurgitation is not visualized. No aortic stenosis is present. Aortic valve peak gradient measures 9.9 mmHg. Pulmonic Valve: The pulmonic valve was normal in structure. Pulmonic valve regurgitation is trivial. No evidence of pulmonic stenosis. Aorta: The aortic root is normal in size and structure. Venous: The inferior vena cava is normal in size with less than 50% respiratory variability, suggesting right atrial pressure of 8 mmHg. IAS/Shunts: No atrial level shunt detected by color flow Doppler.  LEFT VENTRICLE PLAX 2D LVIDd:         5.00 cm   Diastology LVIDs:         3.70 cm   LV e' medial:    7.51 cm/s LV PW:         0.80 cm   LV E/e' medial:  14.2 LV IVS:        0.80 cm   LV e' lateral:   11.00 cm/s LVOT diam:     1.90 cm   LV E/e' lateral: 9.7 LV SV:         54 LV SV Index:   31 LVOT Area:     2.84  cm  RIGHT VENTRICLE            IVC RV S prime:     9.25 cm/s  IVC diam: 2.00 cm TAPSE (M-mode): 1.1 cm LEFT ATRIUM             Index  RIGHT ATRIUM           Index LA diam:        4.10 cm 2.40 cm/m   RA Area:     16.20 cm LA Vol (A2C):   43.3 ml 25.39 ml/m  RA Volume:   41.90 ml  24.57 ml/m LA Vol (A4C):   54.3 ml 31.84 ml/m LA Biplane Vol: 51.0 ml 29.90 ml/m  AORTIC VALVE                 PULMONIC VALVE AV Area (Vmax): 1.82 cm     PR End Diast Vel: 7.51 msec AV Vmax:        157.00 cm/s AV Peak Grad:   9.9 mmHg LVOT Vmax:      100.65 cm/s LVOT Vmean:     63.475 cm/s LVOT VTI:       0.189 m  AORTA Ao Root diam: 2.90 cm Ao Asc diam:  3.30 cm MITRAL VALVE                TRICUSPID VALVE MV Area (PHT): 5.02 cm     TR Peak grad:   42.5 mmHg MV Decel Time: 151 msec     TR Vmax:        326.00 cm/s MR Peak grad: 52.0 mmHg MR Vmax:      360.50 cm/s   SHUNTS MV E velocity: 107.00 cm/s  Systemic VTI:  0.19 m MV A velocity: 39.00 cm/s   Systemic Diam: 1.90 cm MV E/A ratio:  2.74 Arvilla Meres MD Electronically signed by Arvilla Meres MD Signature Date/Time: 04/05/2023/10:28:09 AM    Final    CT HEAD WO CONTRAST ( )  Result Date: 04/04/2023 CLINICAL DATA:  Stroke, follow up EXAM: CT HEAD WITHOUT CONTRAST TECHNIQUE: Contiguous axial images were obtained from the base of the skull through the vertex without intravenous contrast. RADIATION DOSE REDUCTION: This exam was performed according to the departmental dose-optimization program which includes automated exposure control, adjustment of the mA and/or kV according to patient size and/or use of iterative reconstruction technique. COMPARISON:  CT Head October 20, 24. FINDINGS: Motion limited study.  Within this limitation: Brain: No evidence of acute large vascular territory infarction, hemorrhage, hydrocephalus, extra-axial collection or mass lesion/mass effect. Patchy white matter hypodensities, nonspecific but compatible with chronic microvascular  ischemic disease. Vascular: No hyperdense vessel.  Calcific atherosclerosis. Skull: No acute fracture. Sinuses/Orbits: Clear sinuses.  No acute orbital findings. IMPRESSION: Motion limited study without evidence of acute intracranial abnormality. Electronically Signed   By: Feliberto Harts M.D.   On: 04/04/2023 12:58   DG Chest Portable 1 View  Result Date: 04/04/2023 CLINICAL DATA:  stroke EXAM: PORTABLE CHEST 1 VIEW COMPARISON:  Chest x-ray dated April 24, 2020 FINDINGS: Mild cardiomegaly. Diffuse interstitial opacities. No evidence of pleural effusion or pneumothorax. IMPRESSION: Diffuse interstitial opacities, likely due to pulmonary edema. Electronically Signed   By: Allegra Lai M.D.   On: 04/04/2023 12:04   CT ANGIO HEAD NECK W WO CM W PERF (CODE STROKE)  Result Date: 04/04/2023 CLINICAL DATA:  Right-sided weakness EXAM: CT ANGIOGRAPHY HEAD AND NECK CT PERFUSION BRAIN TECHNIQUE: Multidetector CT imaging of the head and neck was performed using the standard protocol during bolus administration of intravenous contrast. Multiplanar CT image reconstructions and MIPs were obtained to evaluate the vascular anatomy. Carotid stenosis measurements (when applicable) are obtained utilizing NASCET criteria, using the distal internal carotid diameter as the denominator. Multiphase CT imaging of the brain was performed  following IV bolus contrast injection. Subsequent parametric perfusion maps were calculated using RAPID software. RADIATION DOSE REDUCTION: This exam was performed according to the departmental dose-optimization program which includes automated exposure control, adjustment of the mA and/or kV according to patient size and/or use of iterative reconstruction technique. CONTRAST:  OMNIPAQUE IOHEXOL 350 MG/ML SOLN COMPARISON:  Earlier today FINDINGS: CTA NECK FINDINGS Aortic arch: Atherosclerosis. Right carotid system: Mixed density plaque at the bifurcation and bulb. No flow reducing  stenosis or ulceration Left carotid system: Atheromatous plaque greatest at the bifurcation. No flow reducing stenosis or ulceration. Vertebral arteries: No proximal subclavian stenosis. Atheromatous narrowing of the proximal right vertebral artery causing at least 50% stenosis. No ulceration or beading. Skeleton: No acute finding Other neck: No acute finding Upper chest: Interlobular septal thickening at the apices. Review of the MIP images confirms the above findings CTA HEAD FINDINGS Anterior circulation: Left M1 occlusion with abrupt cut off consistent with emergent embolic disease. There is downstream underfilling. Atheromatous plaque affects the carotid siphons without flow reducing stenosis. Negative for aneurysm Posterior circulation: Fenestrated proximal basilar. The vertebral and basilar arteries are diffusely patent. No branch occlusion, beading, or vascular malformation. Atheromatous irregularity of the posterior cerebral arteries with moderate narrowing at the left P2 segment. 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 Venous sinuses: Diffusely patent Anatomic variants: None significant Review of the MIP images confirms the above findings CT Brain Perfusion Findings: ASPECTS: 10 CBF (<30%) Volume: 0mL Perfusion (Tmax>6.0s) volume: Case discussed with Dr. Otelia Limes while study was in progress. IMPRESSION: 1. Emergent large vessel occlusion at the left M1 segment. There is 146 cc of penumbra and no core infarct by CT perfusion. 2. No flow reducing stenosis or embolic source seen underlying the embolus. 3. Atherosclerosis with flow reducing stenosis at the right vertebral origin. Notable atheromatous irregularity of the posterior cerebral arteries. 4. 2 mm left superior cerebellar aneurysm. 5. Interstitial edema in the apical lungs. Electronically Signed   By: Tiburcio Pea M.D.   On: 04/04/2023 11:00   CT HEAD CODE STROKE WO CONTRAST  Result Date: 04/04/2023 CLINICAL DATA:  Code stroke.  Right upper extremity weakness. Aphasia. EXAM: CT HEAD WITHOUT CONTRAST TECHNIQUE: Contiguous axial images were obtained from the base of the skull through the vertex without intravenous contrast. RADIATION DOSE REDUCTION: This exam was performed according to the departmental dose-optimization program which includes automated exposure control, adjustment of the mA and/or kV according to patient size and/or use of iterative reconstruction technique. COMPARISON:  None Available. FINDINGS: Brain: No evidence of acute infarction, hemorrhage, hydrocephalus, extra-axial collection or mass lesion/mass effect. Mild generalized brain atrophy. Mild chronic small vessel ischemia in the deep cerebral white matter. Vascular: No hyperdense vessel or unexpected calcification. Skull: Normal. Negative for fracture or focal lesion. Sinuses/Orbits: No acute finding. Other: Prelim sent in epic chat. ASPECTS [facility] Stroke Program Early CT Score) - Ganglionic level infarction (caudate, lentiform nuclei, internal capsule, insula, M1-M3 cortex): 7 - Supraganglionic infarction (M4-M6 cortex): 3 Total score (0-10 with 10 being normal): 10 IMPRESSION: Aging brain without acute finding. Electronically Signed   By: Tiburcio Pea M.D.   On: 04/04/2023 10:22    Labs:  CBC: Recent Labs    04/04/23 1005 04/04/23 1018 04/05/23 0755  WBC 7.5  --  9.5  HGB 13.2 13.6 11.4*  HCT 40.0 40.0 34.8*  PLT 140*  --  130*    COAGS: Recent Labs    04/04/23 1005  INR 1.1  APTT 24  BMP: Recent Labs    04/04/23 1005 04/04/23 1018 04/05/23 0755  NA 135 140 139  K 3.7 3.9 3.4*  CL 103 106 109  CO2 24  --  21*  GLUCOSE 169* 165* 111*  BUN 13 12 8   CALCIUM 8.8*  --  8.9  CREATININE 0.88 0.80 0.79  GFRNONAA >60  --  >60    LIVER FUNCTION TESTS: Recent Labs    04/04/23 1005  BILITOT 0.7  AST 16  ALT 12  ALKPHOS 50  PROT 7.2  ALBUMIN 4.0    Assessment and Plan: L MCA occlusion s/p complete revascularization of  the occluded left MCA M1 distally with mechanical thrombectomy x2.  Patient resting comfortably.  She remains weak on the right side with slurred speech, poor hand grip, poor upper and lower right-sided strength.  She appears to have some degree of right-sided neglect.  Groin site stable.   No current needs from IR.  Further care per Neuro.  Electronically Signed: Hoyt Koch, PA 04/05/2023, 11:00 AM   I spent a total of 15 Minutes at the the patient's bedside AND on the patient's hospital floor or unit, greater than 50% of which was counseling/coordinating care for L MCA occlusion.

## 2023-04-05 NOTE — Consult Note (Signed)
Cardiology Consultation   Patient ID: [name] MRN: [id number]; DOB: 05-28-1941  Admit date: 04/04/2023 Date of Consult: 04/05/2023  PCP:  Hart Robinsons, DO   [facility] Providers Cardiologist:  None   {  Patient Profile:   [name] is a 82 y.o. female with a hx of CAD on cardiac catheterization in 2017, hypertension, left bundle branch block who is being seen 04/05/2023 for the evaluation of new onset atrial fibrillation at the request of neurology.  History of Present Illness:   Ms. Elley history of cardiac catheterization 03/23/2016 with 45%'s stenosis of both the ostial LAD and proximal LAD.  Given mild disease medical therapy was recommended placed on aspirin and statin.  She did have some mild LV dysfunction at that time.    Previously she had some chronic lower extremity edema that thought may have been attributed to amlodipine so this was discontinued and she was started on clonidine 0.1 mg twice daily.Since 2018 patient has not been seen by cardiology.  Currently patient is being evaluated for acute onset of right-sided weakness, right facial droop, aphasia.  CT angiogram revealed left M1 occlusion and patient was taken for interventional radiology with mechanical thrombectomy with successful revascularization.  Has done well postprocedure was extubated shortly after.  Cardiology has been asked to see due to new onset of atrial fibrillation with controlled rates today.  Started on IV heparin.  She is unaware of her atrial fibrillation denies any cardiac complaints such as chest pain, shortness of breath, palpitations, dizziness, fatigue.  He has a prescription for p.o. Lasix 40 mg but she states that she takes this as needed.  She states that she ambulates at home and with her grandchildren.  Does well overall.  She does seem somnolent however responds to questions appropriately and does have some slurred speech which makes interview somewhat  challenging.  A1c 5.6, LDL 87.  Potassium 3.4.  TSH normal.  Troponin 10-28.   Past Medical History:  Diagnosis Date   Chest pain    Coronary artery disease    Hypertension    LBBB (left bundle branch block)     Past Surgical History:  Procedure Laterality Date   ABDOMINAL HYSTERECTOMY     CARDIAC CATHETERIZATION N/A 03/23/2016   Procedure: Left Heart Cath and Coronary Angiography;  Surgeon: Lennette Bihari, MD;  Location: [facility] INVASIVE CV LAB;  Service: Cardiovascular;  Laterality: N/A;   CHOLECYSTECTOMY     RADIOLOGY WITH ANESTHESIA N/A 04/04/2023   Procedure: IR WITH ANESTHESIA;  Surgeon: Radiologist, Medication, MD;  Location: [facility] OR;  Service: Radiology;  Laterality: N/A;   VARICOSE VEIN SURGERY      Inpatient Medications: Scheduled Meds:  atorvastatin  80 mg Oral Daily   Chlorhexidine Gluconate Cloth  6 each Topical Daily   losartan  100 mg Oral Daily   pantoprazole  40 mg Oral QHS   Continuous Infusions:  heparin 800 Units/hr (04/05/23 1600)   PRN Meds: acetaminophen **OR** acetaminophen (TYLENOL) oral liquid 160 mg/5 mL **OR** acetaminophen, hydrALAZINE, labetalol, mouth rinse, senna-docusate  Allergies:    Allergies  Allergen Reactions   Codeine Nausea And Vomiting   Penicillins Nausea And Vomiting    Social History:   Social History   Socioeconomic History   Marital status: Widowed    Spouse name: Not on file   Number of children: 2   Years of education: Not on file   Highest education level: Not on file  Occupational History  Not on file  Tobacco Use   Smoking status: Never   Smokeless tobacco: Never  Vaping Use   Vaping status: Never Used  Substance and Sexual Activity   Alcohol use: No   Drug use: No   Sexual activity: Not on file  Other Topics Concern   Not on file  Social History Narrative   Not on file   Social Determinants of Health   Financial Resource Strain: Low Risk  (03/24/2020)   Received from [facility]   Overall  Financial Resource Strain (CARDIA)    Difficulty of Paying Living Expenses: Not hard at all  Food Insecurity: No Food Insecurity (03/24/2020)   Received from [facility]   Hunger Vital Sign    Worried About Running Out of Food in the Last Year: Never true    Ran Out of Food in the Last Year: Never true  Transportation Needs: No Transportation Needs (03/24/2020)   Received from [facility] - Transportation    Lack of Transportation (Medical): No    Lack of Transportation (Non-Medical): No  Physical Activity: Not on file  Stress: Not on file  Social Connections: Unknown (03/24/2020)   Received from [facility]   Social Connection and Isolation Panel [NHANES]    Frequency of Communication with Friends and Family: More than three times a week    Frequency of Social Gatherings with Friends and Family: More than three times a week    Attends Religious Services: Not on Marketing executive or Organizations: Not on file    Attends Banker Meetings: Not on file    Marital Status: Not on file  Intimate Partner Violence: Not on file    Family History:    Family History  Problem Relation Age of Onset   Cancer Mother    Heart attack Father      ROS:  Please see the history of present illness.  All other ROS reviewed and negative.     Physical Exam/Data:   Vitals:   04/05/23 1400 04/05/23 1500 04/05/23 1515 04/05/23 1600  BP: (!) 163/55 (!) 164/60 130/69 136/60  Pulse: 76 (!) 58 70 63  Resp: 20 20 17 20   Temp:   99.3 F (37.4 C)   TempSrc:   Oral   SpO2: 95% 95% 95% 97%  Weight:      Height:        Intake/Output Summary (Last 24 hours) at 04/05/2023 1652 Last data filed at 04/05/2023 1600 Gross per 24 hour  Intake 1356.31 ml  Output 1685 ml  Net -328.69 ml      04/04/2023   11:00 AM 06/20/2020    1:13 PM 05/31/2017    2:23 PM  Last 3 Weights  Weight (lbs) 141 lb 1.5 oz 146 lb 4.8 oz 167 lb  Weight (kg) 64 kg 66.361 kg  75.751 kg     Body mass index is 23.48 kg/m.  General:  Well nourished, well developed, in no acute distress HEENT: normal Neck: no JVD Vascular: No carotid bruits; Distal pulses 2+ bilaterally Cardiac: Irregularly irregular Lungs:  clear to auscultation bilaterally, no wheezing, rhonchi or rales  Abd: soft, nontender, no hepatomegaly  Ext: no edema Musculoskeletal:  No deformities, BUE and BLE strength normal and equal Skin: warm and dry  Neuro:  CNs 2-12 intact, no focal abnormalities noted Psych:  Normal affect   EKG:  The EKG was personally reviewed and demonstrates:  Atrial fibrillation, left bundle branch block, frequent PVCs, HR 69 Telemetry:  Telemetry was personally reviewed and demonstrates: Atrial fibrillation heart rate 60-90  Relevant CV Studies: Echocardiogram 04/05/2023 1. Left ventricular ejection fraction, by estimation, is 45 to 50%. The  left ventricle has mildly decreased function. The left ventricle  demonstrates global hypokinesis. Left ventricular diastolic function could  not be evaluated.   2. Right ventricular systolic function is normal. The right ventricular  size is normal.   3. Left atrial size was mildly dilated.   4. Right atrial size was mildly dilated.   5. The mitral valve is normal in structure. Trivial mitral valve  regurgitation. No evidence of mitral stenosis.   6. The aortic valve is tricuspid. There is mild calcification of the  aortic valve. Aortic valve regurgitation is not visualized. No aortic  stenosis is present.   7. The inferior vena cava is normal in size with <50% respiratory  variability, suggesting right atrial pressure of 8 mmHg.   Laboratory Data:  High Sensitivity Troponin:   Recent Labs  Lab 04/04/23 1005 04/04/23 1710  TROPONINIHS 10 28*     Chemistry Recent Labs  Lab 04/04/23 1005 04/04/23 1018 04/05/23 0755  NA 135 140 139  K 3.7 3.9 3.4*  CL 103 106 109  CO2 24  --  21*  GLUCOSE 169* 165* 111*  BUN  13 12 8   CREATININE 0.88 0.80 0.79  CALCIUM 8.8*  --  8.9  MG  --   --  1.7  GFRNONAA >60  --  >60  ANIONGAP 8  --  9    Recent Labs  Lab 04/04/23 1005  PROT 7.2  ALBUMIN 4.0  AST 16  ALT 12  ALKPHOS 50  BILITOT 0.7   Lipids  Recent Labs  Lab 04/05/23 0755  CHOL 143  TRIG 49  HDL 46  LDLCALC 87  CHOLHDL 3.1    Hematology Recent Labs  Lab 04/04/23 1005 04/04/23 1018 04/05/23 0755  WBC 7.5  --  9.5  RBC 4.56  --  4.05  HGB 13.2 13.6 11.4*  HCT 40.0 40.0 34.8*  MCV 87.7  --  85.9  MCH 28.9  --  28.1  MCHC 33.0  --  32.8  RDW 12.6  --  12.7  PLT 140*  --  130*   Thyroid  Recent Labs  Lab 04/04/23 1005 04/04/23 1038  TSH 3.014  --   FREET4  --  0.86    BNP Recent Labs  Lab 04/04/23 1005  BNP 228.0*    DDimer No results for input(s): "DDIMER" in the last 168 hours.   Radiology/Studies:  MR BRAIN WO CONTRAST  Result Date: 04/05/2023 CLINICAL DATA:  Stroke, follow up EXAM: MRI HEAD WITHOUT CONTRAST TECHNIQUE: Multiplanar, multiecho pulse sequences of the brain and surrounding structures were obtained without intravenous contrast. COMPARISON:  CT head 04/04/2023 FINDINGS: Brain: Acute left MCA territory infarct involving the insula, left temporal lobe in the corona radiata. There is a additional punctate acute infarcts in the right parietal lobe. There is susceptibility artifact in the anterior left temporal lobe, favored to represent a small petechial hemorrhage. No mass effect. No mass lesion. No hydrocephalus. No extra-axial fluid collection Vascular: Normal flow voids. Skull and upper cervical spine: Normal marrow signal. Sinuses/Orbits: No middle ear or mastoid effusion. Paranasal sinuses are clear Other: None. IMPRESSION: 1. Acute left MCA territory infarct involving the insula, left temporal lobe and corona radiata. Susceptibility artifact in the anterior left  temporal lobe, favored to represent a small petechial hemorrhage. No mass effect. 2. Additional  punctate embolic acute infarcts in the right parietal lobe. These results will be called to the ordering clinician or representative by the Radiologist Assistant, and communication documented in the PACS or [employer]. Electronically Signed   By: Lorenza Cambridge M.D.   On: 04/05/2023 12:06   ECHOCARDIOGRAM COMPLETE  Result Date: 04/05/2023    ECHOCARDIOGRAM REPORT   Patient Name:   [name] Date of Exam: 04/05/2023 Medical Rec #:  [id number]        Height:       65.0 in Accession #:    [id number]       Weight:       141.1 lb Date of Birth:  26-Nov-1940         BSA:          1.706 m Patient Age:    82 years         BP:           159/62 mmHg Patient Gender: F                HR:           64 bpm. Exam Location:  Inpatient Procedure: 2D Echo, Cardiac Doppler and Color Doppler Indications:    Stroke I63.9  History:        Patient has prior history of Echocardiogram examinations, most                 recent 03/25/2017. Angina and CAD, Stroke, Arrythmias:LBBB,                 Signs/Symptoms:Chest Pain; Risk Factors:Hypertension and                 Dyslipidemia.  Sonographer:    Lucendia Herrlich Referring Phys: Kalman Drape WOLFE IMPRESSIONS  1. Left ventricular ejection fraction, by estimation, is 45 to 50%. The left ventricle has mildly decreased function. The left ventricle demonstrates global hypokinesis. Left ventricular diastolic function could not be evaluated.  2. Right ventricular systolic function is normal. The right ventricular size is normal.  3. Left atrial size was mildly dilated.  4. Right atrial size was mildly dilated.  5. The mitral valve is normal in structure. Trivial mitral valve regurgitation. No evidence of mitral stenosis.  6. The aortic valve is tricuspid. There is mild calcification of the aortic valve. Aortic valve regurgitation is not visualized. No aortic stenosis is present.  7. The inferior vena cava is normal in size with <50% respiratory variability, suggesting right atrial  pressure of 8 mmHg. FINDINGS  Left Ventricle: Left ventricular ejection fraction, by estimation, is 45 to 50%. The left ventricle has mildly decreased function. The left ventricle demonstrates global hypokinesis. The left ventricular internal cavity size was normal in size. There is  no left ventricular hypertrophy. Left ventricular diastolic function could not be evaluated due to atrial fibrillation. Left ventricular diastolic function could not be evaluated. Right Ventricle: The right ventricular size is normal. No increase in right ventricular wall thickness. Right ventricular systolic function is normal. Left Atrium: Left atrial size was mildly dilated. Right Atrium: Right atrial size was mildly dilated. Pericardium: There is no evidence of pericardial effusion. Mitral Valve: The mitral valve is normal in structure. Trivial mitral valve regurgitation. No evidence of mitral valve stenosis. Tricuspid Valve: The tricuspid valve is normal in structure. Tricuspid valve regurgitation is mild . No evidence of tricuspid stenosis.  Aortic Valve: The aortic valve is tricuspid. There is mild calcification of the aortic valve. Aortic valve regurgitation is not visualized. No aortic stenosis is present. Aortic valve peak gradient measures 9.9 mmHg. Pulmonic Valve: The pulmonic valve was normal in structure. Pulmonic valve regurgitation is trivial. No evidence of pulmonic stenosis. Aorta: The aortic root is normal in size and structure. Venous: The inferior vena cava is normal in size with less than 50% respiratory variability, suggesting right atrial pressure of 8 mmHg. IAS/Shunts: No atrial level shunt detected by color flow Doppler.  LEFT VENTRICLE PLAX 2D LVIDd:         5.00 cm   Diastology LVIDs:         3.70 cm   LV e' medial:    7.51 cm/s LV PW:         0.80 cm   LV E/e' medial:  14.2 LV IVS:        0.80 cm   LV e' lateral:   11.00 cm/s LVOT diam:     1.90 cm   LV E/e' lateral: 9.7 LV SV:         54 LV SV Index:   31  LVOT Area:     2.84 cm  RIGHT VENTRICLE            IVC RV S prime:     9.25 cm/s  IVC diam: 2.00 cm TAPSE (M-mode): 1.1 cm LEFT ATRIUM             Index        RIGHT ATRIUM           Index LA diam:        4.10 cm 2.40 cm/m   RA Area:     16.20 cm LA Vol (A2C):   43.3 ml 25.39 ml/m  RA Volume:   41.90 ml  24.57 ml/m LA Vol (A4C):   54.3 ml 31.84 ml/m LA Biplane Vol: 51.0 ml 29.90 ml/m  AORTIC VALVE                 PULMONIC VALVE AV Area (Vmax): 1.82 cm     PR End Diast Vel: 7.51 msec AV Vmax:        157.00 cm/s AV Peak Grad:   9.9 mmHg LVOT Vmax:      100.65 cm/s LVOT Vmean:     63.475 cm/s LVOT VTI:       0.189 m  AORTA Ao Root diam: 2.90 cm Ao Asc diam:  3.30 cm MITRAL VALVE                TRICUSPID VALVE MV Area (PHT): 5.02 cm     TR Peak grad:   42.5 mmHg MV Decel Time: 151 msec     TR Vmax:        326.00 cm/s MR Peak grad: 52.0 mmHg MR Vmax:      360.50 cm/s   SHUNTS MV E velocity: 107.00 cm/s  Systemic VTI:  0.19 m MV A velocity: 39.00 cm/s   Systemic Diam: 1.90 cm MV E/A ratio:  2.74 Arvilla Meres MD Electronically signed by Arvilla Meres MD Signature Date/Time: 04/05/2023/10:28:09 AM    Final    CT HEAD WO CONTRAST ( )  Result Date: 04/04/2023 CLINICAL DATA:  Stroke, follow up EXAM: CT HEAD WITHOUT CONTRAST TECHNIQUE: Contiguous axial images were obtained from the base of the skull through the vertex without intravenous contrast. RADIATION DOSE REDUCTION: This exam was performed according to the  departmental dose-optimization program which includes automated exposure control, adjustment of the mA and/or kV according to patient size and/or use of iterative reconstruction technique. COMPARISON:  CT Head October 20, 24. FINDINGS: Motion limited study.  Within this limitation: Brain: No evidence of acute large vascular territory infarction, hemorrhage, hydrocephalus, extra-axial collection or mass lesion/mass effect. Patchy white matter hypodensities, nonspecific but compatible with chronic  microvascular ischemic disease. Vascular: No hyperdense vessel.  Calcific atherosclerosis. Skull: No acute fracture. Sinuses/Orbits: Clear sinuses.  No acute orbital findings. IMPRESSION: Motion limited study without evidence of acute intracranial abnormality. Electronically Signed   By: Feliberto Harts M.D.   On: 04/04/2023 12:58   DG Chest Portable 1 View  Result Date: 04/04/2023 CLINICAL DATA:  stroke EXAM: PORTABLE CHEST 1 VIEW COMPARISON:  Chest x-ray dated April 24, 2020 FINDINGS: Mild cardiomegaly. Diffuse interstitial opacities. No evidence of pleural effusion or pneumothorax. IMPRESSION: Diffuse interstitial opacities, likely due to pulmonary edema. Electronically Signed   By: Allegra Lai M.D.   On: 04/04/2023 12:04   CT ANGIO HEAD NECK W WO CM W PERF (CODE STROKE)  Result Date: 04/04/2023 CLINICAL DATA:  Right-sided weakness EXAM: CT ANGIOGRAPHY HEAD AND NECK CT PERFUSION BRAIN TECHNIQUE: Multidetector CT imaging of the head and neck was performed using the standard protocol during bolus administration of intravenous contrast. Multiplanar CT image reconstructions and MIPs were obtained to evaluate the vascular anatomy. Carotid stenosis measurements (when applicable) are obtained utilizing NASCET criteria, using the distal internal carotid diameter as the denominator. Multiphase CT imaging of the brain was performed following IV bolus contrast injection. Subsequent parametric perfusion maps were calculated using RAPID software. RADIATION DOSE REDUCTION: This exam was performed according to the departmental dose-optimization program which includes automated exposure control, adjustment of the mA and/or kV according to patient size and/or use of iterative reconstruction technique. CONTRAST:  OMNIPAQUE IOHEXOL 350 MG/ML SOLN COMPARISON:  Earlier today FINDINGS: CTA NECK FINDINGS Aortic arch: Atherosclerosis. Right carotid system: Mixed density plaque at the bifurcation and bulb. No  flow reducing stenosis or ulceration Left carotid system: Atheromatous plaque greatest at the bifurcation. No flow reducing stenosis or ulceration. Vertebral arteries: No proximal subclavian stenosis. Atheromatous narrowing of the proximal right vertebral artery causing at least 50% stenosis. No ulceration or beading. Skeleton: No acute finding Other neck: No acute finding Upper chest: Interlobular septal thickening at the apices. Review of the MIP images confirms the above findings CTA HEAD FINDINGS Anterior circulation: Left M1 occlusion with abrupt cut off consistent with emergent embolic disease. There is downstream underfilling. Atheromatous plaque affects the carotid siphons without flow reducing stenosis. Negative for aneurysm Posterior circulation: Fenestrated proximal basilar. The vertebral and basilar arteries are diffusely patent. No branch occlusion, beading, or vascular malformation. Atheromatous irregularity of the posterior cerebral arteries with moderate narrowing at the left P2 segment. 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 Venous sinuses: Diffusely patent Anatomic variants: None significant Review of the MIP images confirms the above findings CT Brain Perfusion Findings: ASPECTS: 10 CBF (<30%) Volume: 0mL Perfusion (Tmax>6.0s) volume: Case discussed with Dr. Otelia Limes while study was in progress. IMPRESSION: 1. Emergent large vessel occlusion at the left M1 segment. There is 146 cc of penumbra and no core infarct by CT perfusion. 2. No flow reducing stenosis or embolic source seen underlying the embolus. 3. Atherosclerosis with flow reducing stenosis at the right vertebral origin. Notable atheromatous irregularity of the posterior cerebral arteries. 4. 2 mm left superior cerebellar aneurysm. 5. Interstitial edema in  the apical lungs. Electronically Signed   By: Tiburcio Pea M.D.   On: 04/04/2023 11:00   CT HEAD CODE STROKE WO CONTRAST  Result Date: 04/04/2023 CLINICAL DATA:   Code stroke. Right upper extremity weakness. Aphasia. EXAM: CT HEAD WITHOUT CONTRAST TECHNIQUE: Contiguous axial images were obtained from the base of the skull through the vertex without intravenous contrast. RADIATION DOSE REDUCTION: This exam was performed according to the departmental dose-optimization program which includes automated exposure control, adjustment of the mA and/or kV according to patient size and/or use of iterative reconstruction technique. COMPARISON:  None Available. FINDINGS: Brain: No evidence of acute infarction, hemorrhage, hydrocephalus, extra-axial collection or mass lesion/mass effect. Mild generalized brain atrophy. Mild chronic small vessel ischemia in the deep cerebral white matter. Vascular: No hyperdense vessel or unexpected calcification. Skull: Normal. Negative for fracture or focal lesion. Sinuses/Orbits: No acute finding. Other: Prelim sent in epic chat. ASPECTS [facility] Stroke Program Early CT Score) - Ganglionic level infarction (caudate, lentiform nuclei, internal capsule, insula, M1-M3 cortex): 7 - Supraganglionic infarction (M4-M6 cortex): 3 Total score (0-10 with 10 being normal): 10 IMPRESSION: Aging brain without acute finding. Electronically Signed   By: Tiburcio Pea M.D.   On: 04/04/2023 10:22     Assessment and Plan:   New onset atrial fibrillation Frequent PVCs Currently rate controlled with heart rates between 60 and 90.  Is asymptomatic without any complaints. Continue IV heparin and transition to Eliquis 5 mg twice daily when more stable and without any procedures.  She is 10 but has normal renal function.  Need to monitor weight as she is 64 kg and would require reduced dosing if she drops below 60. Will start her on Toprol-XL 50 mg.  Outpatient she was on 100 this seems a bit high.  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 She has some residual deficits with right sided facial droop and slurred speech.  Further management per primary team.  Chronic heart failure with mildly reduced EF EF 45 to 50%.  Normal RV function.  Mildly dilated atria.  No signs of heart failure symptoms.   She has lisinopril and losartan listed on her medication list.  Hopefully this is not correct as this would not be indicated.  Continue losartan 100 mg daily takes Lasix 40 mg as needed at home  Nonobstructive CAD Noted on catheterization in 2017 that noted 45% stenosis of proximal/ostial LAD has been medically managed.  No anginal complaints. Continue aspirin and atorvastatin 80 mg daily this was recently increased from 40.  LDL 87.  Hypertension Decently well-controlled would not be continue her clonidine before starting back her ACE inhibitor or beta-blocker.  Not sure if she even needs this.   Risk Assessment/Risk Scores:   New York Heart Association (NYHA) Functional Class NYHA Class I  CHA2DS2-VASc Score = 8  This indicates a 10.8% annual risk of stroke. The patient's score is based upon: CHF History: 1 HTN History: 1 Diabetes History: 0 Stroke History: 2 Vascular Disease History: 1 Age Score: 2 Gender Score: 1    For questions or updates, please contact [facility] Please consult [email or web address] for contact info under    Signed, Abagail Kitchens, PA-C  04/05/2023 4:52 PM

## 2023-04-05 NOTE — TOC Benefit Eligibility Note (Signed)

## 2023-04-05 NOTE — Plan of Care (Signed)
Alert and oriented x2.  Follows commands.  Left side at baseline.  Right sided weakness noted with mild expressive aphasia and mild dysarthria.  BP controlled with po meds.  Other vss.  Passed swallow evaluation this am.  Foley in place with plan to dc today.

## 2023-04-05 NOTE — Progress Notes (Signed)
Echocardiogram 2D Echocardiogram has been performed.  [name] 04/05/2023, 9:37 AM

## 2023-04-05 NOTE — Progress Notes (Signed)
PT Cancellation Note  Patient Details Name: [name] MRN: [id number] DOB: January 03, 1941   Cancelled Treatment:    Reason Eval/Treat Not Completed: Active bedrest order post sheath removal until 1647. Will follow when cleared for mobility.   Lyanne Co, PT  Acute Rehab Services Secure chat preferred Office [phone]    Elyse Hsu 04/05/2023, 8:38 AM

## 2023-04-05 NOTE — Evaluation (Signed)
Clinical/Bedside Swallow Evaluation Patient Details  Name: [name] MRN: [id number] Date of Birth: 1941/02/19  Today's Date: 04/05/2023 Time: SLP Start Time (ACUTE ONLY): 0943 SLP Stop Time (ACUTE ONLY): 1014 SLP Time Calculation (min) (ACUTE ONLY): 31 min  Past Medical History:  Past Medical History:  Diagnosis Date   Chest pain    Coronary artery disease    Hypertension    LBBB (left bundle branch block)    Past Surgical History:  Past Surgical History:  Procedure Laterality Date   ABDOMINAL HYSTERECTOMY     CARDIAC CATHETERIZATION N/A 03/23/2016   Procedure: Left Heart Cath and Coronary Angiography;  Surgeon: Lennette Bihari, MD;  Location: [facility] INVASIVE CV LAB;  Service: Cardiovascular;  Laterality: N/A;   CHOLECYSTECTOMY     RADIOLOGY WITH ANESTHESIA N/A 04/04/2023   Procedure: IR WITH ANESTHESIA;  Surgeon: Radiologist, Medication, MD;  Location: [facility] OR;  Service: Radiology;  Laterality: N/A;   VARICOSE VEIN SURGERY     HPI:  [name] is a 82 y.o. female  has a past medical history of Chest pain, Coronary artery disease, Hypertension, and LBBB (left bundle branch block). who presents with right-sided weakness, with a good baseline per family of living independently and driving, presented to [facility] via EMS after family noted patient to be acutely weak on the right side with right facial droop and aphasia.  Underwent thrombectomy    Assessment / Plan / Recommendation  Clinical Impression  Pt demonstrates a moderate oral dysphagia with right labial weakness; suspect lingual weakness as well. Pt able to attend to POs and self feed. There are no signs of aspiration, no coughing or wet vocal quality. Intermittently pt has significant anterior spillage when oral pressure increases. SLP provided pressure to right side of lips and pt was able to drink water consecutively without signs of aspiration. Her cognition does not support independent use of strategies so  thickened liquids were trialed as well without any anterior spillage. Pt does also have mild residue of purees that will need to be cleared by staff after meals. Ok to initaite purees and nectar thick liquids will f/u for tolerance. SLP Visit Diagnosis: Dysphagia, oropharyngeal phase (R13.12)    Aspiration Risk  Mild aspiration risk;Risk for inadequate nutrition/hydration    Diet Recommendation Dysphagia 1 (Puree);Nectar-thick liquid    Liquid Administration via: Cup;Straw Medication Administration: Whole meds with puree Supervision: Staff to assist with self feeding;Full supervision/cueing for compensatory strategies Compensations: Lingual sweep for clearance of pocketing;Small sips/bites;Slow rate Postural Changes: Seated upright at 90 degrees    Other  Recommendations Oral Care Recommendations: Oral care before and after PO Caregiver Recommendations: Have oral suction available    Recommendations for follow up therapy are one component of a multi-disciplinary discharge planning process, led by the attending physician.  Recommendations may be updated based on patient status, additional functional criteria and insurance authorization.  Follow up Recommendations Acute inpatient rehab (3hours/day)      Assistance Recommended at Discharge    Functional Status Assessment    Frequency and Duration min 2x/week  2 weeks       Prognosis Prognosis for improved oropharyngeal function: Fair Barriers to Reach Goals: Cognitive deficits      Swallow Study   General HPI: [name] is a 82 y.o. female  has a past medical history of Chest pain, Coronary artery disease, Hypertension, and LBBB (left bundle branch block). who presents with right-sided weakness, with a good baseline per family of  living independently and driving, presented to [facility] via EMS after family noted patient to be acutely weak on the right side with right facial droop and aphasia.  Underwent  thrombectomy Type of Study: Bedside Swallow Evaluation Previous Swallow Assessment: none Diet Prior to this Study: NPO Temperature Spikes Noted: Yes Respiratory Status: Room air History of Recent Intubation: Yes Total duration of intubation (days): 1 days Date extubated: 04/04/23 Behavior/Cognition: Alert;Cooperative Oral Cavity Assessment: Within Functional Limits Oral Care Completed by SLP: Yes Oral Cavity - Dentition: Poor condition;Edentulous;Missing dentition Vision: Functional for self-feeding Self-Feeding Abilities: Able to feed self Patient Positioning: Upright in bed Baseline Vocal Quality: Low vocal intensity Volitional Cough: Weak Volitional Swallow: Unable to elicit    Oral/Motor/Sensory Function Overall Oral Motor/Sensory Function: Mild impairment Facial ROM: Reduced right;Suspected CN VII (facial) dysfunction Facial Symmetry: Abnormal symmetry right;Suspected CN VII (facial) dysfunction Facial Strength: Reduced right;Suspected CN VII (facial) dysfunction Facial Sensation: Within Functional Limits Lingual ROM: Reduced right;Suspected CN XII (hypoglossal) dysfunction Lingual Symmetry: Within Functional Limits Lingual Strength: Reduced   Ice Chips Ice chips: Impaired Presentation: Spoon Oral Phase Impairments: Poor awareness of bolus Oral Phase Functional Implications: Prolonged oral transit   Thin Liquid Thin Liquid: Impaired Presentation: Straw;Cup;Self Fed Oral Phase Impairments: Reduced labial seal Oral Phase Functional Implications: Right anterior spillage;Right lateral sulci pocketing    Nectar Thick Nectar Thick Liquid: Within functional limits Presentation: Straw;Self Fed   Honey Thick Honey Thick Liquid: Not tested   Puree Puree: Impaired Presentation: Spoon;Self Fed Oral Phase Impairments: Reduced labial seal Oral Phase Functional Implications: Right lateral sulci pocketing   Solid     Solid: Not tested      Temesgen Weightman, Riley Nearing 04/05/2023,11:02 AM

## 2023-04-05 NOTE — Progress Notes (Addendum)
STROKE TEAM PROGRESS NOTE   BRIEF HPI [name] is a 82 y.o. female with history of CAD, hypertension and left bundle branch block presenting with acute onset right-sided weakness, right facial droop and aphasia.   CT angiogram revealed left M1 occlusion, and patient was taken to interventional radiology for mechanical thrombectomy.  This was successful, with TICI 3 revascularization was achieved after 2 passes.  She was extubated postprocedure and appears to have improved but remains disoriented with some right-sided weakness.   SIGNIFICANT HOSPITAL EVENTS 10/20: Patient presented to hospital, mechanical thrombectomy performed  INTERIM HISTORY/SUBJECTIVE Patient has been hemodynamically stable overnight with Tmax of 100.7.  She was extubated postprocedure, and has dysarthria and mild right-sided weakness appears to have improved but is still present.   OBJECTIVE  CBC    Component Value Date/Time   WBC 9.5 04/05/2023 0755   RBC 4.05 04/05/2023 0755   HGB 11.4 (L) 04/05/2023 0755   HCT 34.8 (L) 04/05/2023 0755   PLT 130 (L) 04/05/2023 0755   MCV 85.9 04/05/2023 0755   MCH 28.1 04/05/2023 0755   MCHC 32.8 04/05/2023 0755   RDW 12.7 04/05/2023 0755   LYMPHSABS 1.2 04/05/2023 0755   MONOABS 0.4 04/05/2023 0755   EOSABS 0.0 04/05/2023 0755   BASOSABS 0.0 04/05/2023 0755    BMET    Component Value Date/Time   NA 139 04/05/2023 0755   K 3.4 (L) 04/05/2023 0755   CL 109 04/05/2023 0755   CO2 21 (L) 04/05/2023 0755   GLUCOSE 111 (H) 04/05/2023 0755   BUN 8 04/05/2023 0755   CREATININE 0.79 04/05/2023 0755   CREATININE 1.02 (H) 03/20/2016 1630   CALCIUM 8.9 04/05/2023 0755   GFRNONAA >60 04/05/2023 0755    IMAGING past 24 hours MR BRAIN WO CONTRAST  Result Date: 04/05/2023 CLINICAL DATA:  Stroke, follow up EXAM: MRI HEAD WITHOUT CONTRAST TECHNIQUE: Multiplanar, multiecho pulse sequences of the brain and surrounding structures were obtained without intravenous  contrast. COMPARISON:  CT head 04/04/2023 FINDINGS: Brain: Acute left MCA territory infarct involving the insula, left temporal lobe in the corona radiata. There is a additional punctate acute infarcts in the right parietal lobe. There is susceptibility artifact in the anterior left temporal lobe, favored to represent a small petechial hemorrhage. No mass effect. No mass lesion. No hydrocephalus. No extra-axial fluid collection Vascular: Normal flow voids. Skull and upper cervical spine: Normal marrow signal. Sinuses/Orbits: No middle ear or mastoid effusion. Paranasal sinuses are clear Other: None. IMPRESSION: 1. Acute left MCA territory infarct involving the insula, left temporal lobe and corona radiata. Susceptibility artifact in the anterior left temporal lobe, favored to represent a small petechial hemorrhage. No mass effect. 2. Additional punctate embolic acute infarcts in the right parietal lobe. These results will be called to the ordering clinician or representative by the Radiologist Assistant, and communication documented in the PACS or [employer]. Electronically Signed   By: Lorenza Cambridge M.D.   On: 04/05/2023 12:06   ECHOCARDIOGRAM COMPLETE  Result Date: 04/05/2023    ECHOCARDIOGRAM REPORT   Patient Name:   [name] Date of Exam: 04/05/2023 Medical Rec #:  [id number]        Height:       65.0 in Accession #:    [id number]       Weight:       141.1 lb Date of Birth:  1940/06/24         BSA:  1.706 m Patient Age:    82 years         BP:           159/62 mmHg Patient Gender: F                HR:           64 bpm. Exam Location:  Inpatient Procedure: 2D Echo, Cardiac Doppler and Color Doppler Indications:    Stroke I63.9  History:        Patient has prior history of Echocardiogram examinations, most                 recent 03/25/2017. Angina and CAD, Stroke, Arrythmias:LBBB,                 Signs/Symptoms:Chest Pain; Risk Factors:Hypertension and                 Dyslipidemia.   Sonographer:    Lucendia Herrlich Referring Phys: Kalman Drape WOLFE IMPRESSIONS  1. Left ventricular ejection fraction, by estimation, is 45 to 50%. The left ventricle has mildly decreased function. The left ventricle demonstrates global hypokinesis. Left ventricular diastolic function could not be evaluated.  2. Right ventricular systolic function is normal. The right ventricular size is normal.  3. Left atrial size was mildly dilated.  4. Right atrial size was mildly dilated.  5. The mitral valve is normal in structure. Trivial mitral valve regurgitation. No evidence of mitral stenosis.  6. The aortic valve is tricuspid. There is mild calcification of the aortic valve. Aortic valve regurgitation is not visualized. No aortic stenosis is present.  7. The inferior vena cava is normal in size with <50% respiratory variability, suggesting right atrial pressure of 8 mmHg. FINDINGS  Left Ventricle: Left ventricular ejection fraction, by estimation, is 45 to 50%. The left ventricle has mildly decreased function. The left ventricle demonstrates global hypokinesis. The left ventricular internal cavity size was normal in size. There is  no left ventricular hypertrophy. Left ventricular diastolic function could not be evaluated due to atrial fibrillation. Left ventricular diastolic function could not be evaluated. Right Ventricle: The right ventricular size is normal. No increase in right ventricular wall thickness. Right ventricular systolic function is normal. Left Atrium: Left atrial size was mildly dilated. Right Atrium: Right atrial size was mildly dilated. Pericardium: There is no evidence of pericardial effusion. Mitral Valve: The mitral valve is normal in structure. Trivial mitral valve regurgitation. No evidence of mitral valve stenosis. Tricuspid Valve: The tricuspid valve is normal in structure. Tricuspid valve regurgitation is mild . No evidence of tricuspid stenosis. Aortic Valve: The aortic valve is tricuspid. There  is mild calcification of the aortic valve. Aortic valve regurgitation is not visualized. No aortic stenosis is present. Aortic valve peak gradient measures 9.9 mmHg. Pulmonic Valve: The pulmonic valve was normal in structure. Pulmonic valve regurgitation is trivial. No evidence of pulmonic stenosis. Aorta: The aortic root is normal in size and structure. Venous: The inferior vena cava is normal in size with less than 50% respiratory variability, suggesting right atrial pressure of 8 mmHg. IAS/Shunts: No atrial level shunt detected by color flow Doppler.  LEFT VENTRICLE PLAX 2D LVIDd:         5.00 cm   Diastology LVIDs:         3.70 cm   LV e' medial:    7.51 cm/s LV PW:         0.80 cm   LV E/e' medial:  14.2 LV IVS:        0.80 cm   LV e' lateral:   11.00 cm/s LVOT diam:     1.90 cm   LV E/e' lateral: 9.7 LV SV:         54 LV SV Index:   31 LVOT Area:     2.84 cm  RIGHT VENTRICLE            IVC RV S prime:     9.25 cm/s  IVC diam: 2.00 cm TAPSE (M-mode): 1.1 cm LEFT ATRIUM             Index        RIGHT ATRIUM           Index LA diam:        4.10 cm 2.40 cm/m   RA Area:     16.20 cm LA Vol (A2C):   43.3 ml 25.39 ml/m  RA Volume:   41.90 ml  24.57 ml/m LA Vol (A4C):   54.3 ml 31.84 ml/m LA Biplane Vol: 51.0 ml 29.90 ml/m  AORTIC VALVE                 PULMONIC VALVE AV Area (Vmax): 1.82 cm     PR End Diast Vel: 7.51 msec AV Vmax:        157.00 cm/s AV Peak Grad:   9.9 mmHg LVOT Vmax:      100.65 cm/s LVOT Vmean:     63.475 cm/s LVOT VTI:       0.189 m  AORTA Ao Root diam: 2.90 cm Ao Asc diam:  3.30 cm MITRAL VALVE                TRICUSPID VALVE MV Area (PHT): 5.02 cm     TR Peak grad:   42.5 mmHg MV Decel Time: 151 msec     TR Vmax:        326.00 cm/s MR Peak grad: 52.0 mmHg MR Vmax:      360.50 cm/s   SHUNTS MV E velocity: 107.00 cm/s  Systemic VTI:  0.19 m MV A velocity: 39.00 cm/s   Systemic Diam: 1.90 cm MV E/A ratio:  2.74 Arvilla Meres MD Electronically signed by Arvilla Meres MD Signature  Date/Time: 04/05/2023/10:28:09 AM    Final     Vitals:   04/05/23 1200 04/05/23 1230 04/05/23 1245 04/05/23 1300  BP: (!) 180/85 (!) 177/105 (!) 145/62 (!) 153/74  Pulse: 86 95 76 (!) 50  Resp: 18 (!) 23 (!) 23 (!) 22  Temp:      TempSrc:      SpO2: 96% 90% 96% 91%  Weight:      Height:         PHYSICAL EXAM General:  Alert, well-nourished, well-developed patient in no acute distress Psych:  Mood and affect appropriate for situation CV: Regular rate and rhythm on monitor Respiratory:  Regular, unlabored respirations on room air GI: Abdomen soft and nontender   NEURO:  Mental Status: AA&Ox1, disoriented to time place and situation Speech/Language: speech is with dysarthria, patient is able to name objects and is able to repeat 1 out of 2 sentences but had difficulty with longer repetitions due to dysarthria  Cranial Nerves:  II: PERRL.  Blinks to threat on left more than right III, IV, VI: EOMI. Eyelids elevate symmetrically.  V: Sensation is intact to light touch and symmetrical to face.  VII: Right facial droop VIII: hearing intact to voice. IX,  X: Voice is dysarthric ZO:[id number] shrug 5/5. XII: tongue is midline without fasciculations. Motor: 5/5 strength to left upper and lower extremities, 3 out of 5 strength to right upper extremity, 4 out of 5 strength to right lower extremity Tone: is normal and bulk is normal Sensation- Intact to light touch bilaterally.   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 ASPECTS 10.  CTA head & neck with CTP emergent LVO at left M1 segment with 146 cc of penumbra and no core infarct, atherosclerosis with flow reducing stenosis of right vertebral origin, 2 mm left superior cerebellar aneurysm MRI acute left MCA territory infarct involving insula left temporal lobe and corona radiata with small  petechial hemorrhage in anterior left temporal lobe, additional punctate embolic infarcts in right parietal lobe 2D Echo EF 45 to 50%, left atrium mildly dilated, right atrium mildly dilated, no atrial level shunt LDL 87 HgbA1c 5.6 VTE prophylaxis -SCDs aspirin 81 mg daily prior to admission, now will begin anticoagulation with heparin Therapy recommendations:  Pending Disposition: Pending  Atrial fibrillation Patient was found to have new onset atrial fibrillation today Continue telemetry monitoring Begin anticoagulation with heparin Cardiology consult for new onset A-fib  Hypertension Home meds: Clonidine 0.1 mg twice daily, lisinopril 40 mg daily, losartan 100 mg daily, metoprolol 100 mg every 24 hours Stable Blood Pressure Goal: SBP 120-160 for first 24 hours then less than 180   Hyperlipidemia Home meds: Atorvastatin 40 mg daily, increased to 80 LDL 87, goal < 70 Continue statin at discharge  Dysphagia Patient has post-stroke dysphagia, SLP consulted    Diet   DIET - DYS 1 Fluid consistency: Nectar Thick   Advance diet as tolerated  Other Stroke Risk Factors Coronary artery disease   Other Active Problems None  Hospital day # 1  Patient seen by NP and then by MD, MD to edit note as needed. Cortney E Ernestina Columbia , MSN, AGACNP-BC [facility] See Amion for schedule and pager information 04/05/2023 2:26 PM   STROKE MD NOTE :  I have personally obtained history,examined this patient, reviewed notes, independently viewed imaging studies, participated in medical decision making and plan of care.ROS completed by me personally and pertinent positives fully documented  I have made any additions or clarifications directly to the above note. Agree with note above.  Patient presented with sudden onset of aphasia and right hemiplegia due to left M1 occlusion and underwent successful mechanical thrombectomy with excellent revascularization.  She is doing well with  mild residual dysarthria and right hemiparesis.  She has been found to have new onset A-fib which is the likely etiology for her stroke.  Recommend anticoagulation with IV heparin if no significant hemorrhage on MRI scan.  Physical occupational speech therapy consults.  Strict blood pressure control as per post thrombectomy protocol.  Close neurological monitoring.  Cardiology consult for new onset A-fib.  Long discussion with patient and her 2 grandsons at the bedside and answered questions. This patient is critically ill and at significant risk of neurological worsening, death and care requires constant monitoring of vital signs, hemodynamics,respiratory and cardiac monitoring, extensive review of multiple databases, frequent neurological assessment, discussion with family, other specialists and medical decision making of high complexity.I have made any additions or clarifications directly to the above note.This critical care time does not reflect procedure time, or teaching time or supervisory time of PA/NP/Med Resident  etc but could involve care discussion time.  I spent 30 minutes of neurocritical care time  in the care of  this patient.     [name], MD Medical Director [facility] Stroke [facility] Pager: [phone] 04/05/2023 3:34 PM   To contact Stroke Continuity provider, please refer to [email or web address]. After hours, contact General Neurology

## 2023-04-05 NOTE — Progress Notes (Signed)
OT Cancellation Note  Patient Details Name: [name] MRN: [id number] DOB: 02-24-1941   Cancelled Treatment:    Reason Eval/Treat Not Completed: Active bedrest order (post sheath removal until monday 1647). OT will continue communication with medical team to evaluate Pt when medically appropriate.   [name] 04/05/2023, 8:23 AM  Nyoka Cowden OTR/L Acute Rehabilitation Services Office: [phone]

## 2023-04-05 NOTE — Progress Notes (Signed)
PHARMACY - ANTICOAGULATION CONSULT NOTE  Pharmacy Consult for heparin Indication: atrial fibrillation and stroke  Allergies  Allergen Reactions   Codeine Nausea And Vomiting   Penicillins Nausea And Vomiting    Patient Measurements: Height: 5\' 5"  (165.1 cm) Weight: 64 kg (141 lb 1.5 oz) IBW/kg (Calculated) : 57 Heparin Dosing Weight: 64  Vital Signs: Temp: 98.3 F (36.8 C) (10/21 1130) Temp Source: Oral (10/21 1130) BP: 153/74 (10/21 1300) Pulse Rate: 50 (10/21 1300)  Labs: Recent Labs    04/04/23 1005 04/04/23 1018 04/04/23 1710 04/05/23 0755  HGB 13.2 13.6  --  11.4*  HCT 40.0 40.0  --  34.8*  PLT 140*  --   --  130*  APTT 24  --   --   --   LABPROT 14.2  --   --   --   INR 1.1  --   --   --   CREATININE 0.88 0.80  --  0.79  CKTOTAL 78  --   --   --   TROPONINIHS 10  --  28*  --     Estimated Creatinine Clearance: 48.8 mL/min (by C-G formula based on SCr of 0.79 mg/dL).   Medical History: Past Medical History:  Diagnosis Date   Chest pain    Coronary artery disease    Hypertension    LBBB (left bundle branch block)     Assessment: 82yoF who admitted on 10/20 as code stroke, underwent revascularization w/ NIR on 10/20 1419 given presentation with LVO of M1. Patient with new aflutter, requiring anticoagulation given cardioembolic etiology. CBC relatively stable this AM from admission.  Goal of Therapy:  Heparin level 0.3-0.5 units/ml Monitor platelets by anticoagulation protocol: Yes   Plan:  Start heparin infusion at 800 units/hr, roughly 12.5 u/kg/h Check anti-Xa level in 8 hours at 2100 and daily while on heparin Continue to monitor H&H and platelets F/u long term oral anticoagulation plan  Rutherford Nail, PharmD PGY2 Critical Care Pharmacy Resident 04/05/2023,2:46 PM

## 2023-04-05 NOTE — Evaluation (Signed)
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   Varicose veins of bilateral lower extremities with other complications 05/31/2017   Precordial pain    Angina decubitus (HCC)    LBBB 07/18/2009   CAD in native artery 06/18/2009   HYPERLIPIDEMIA 02/25/2009   Essential hypertension 02/25/2009   LEG EDEMA 02/25/2009   Past Medical History:  Past Medical History:  Diagnosis Date   Chest pain    Coronary artery disease    Hypertension    LBBB (left bundle branch block)    Past Surgical History:  Past Surgical History:  Procedure Laterality Date   ABDOMINAL HYSTERECTOMY     CARDIAC CATHETERIZATION N/A 03/23/2016   Procedure: Left Heart Cath and Coronary Angiography;  Surgeon: Lennette Bihari, MD;  Location: [facility] INVASIVE CV [facility];  Service: Cardiovascular;  Laterality: N/A;   CHOLECYSTECTOMY     RADIOLOGY WITH ANESTHESIA N/A 04/04/2023   Procedure: IR WITH ANESTHESIA;  Surgeon: Radiologist, Medication, MD;  Location: [facility] OR;  Service: Radiology;  Laterality: N/A;   VARICOSE VEIN SURGERY     HPI:  MARITES WAYSON is a 82 y.o. female  has a past medical history of Chest pain, Coronary artery disease, Hypertension, and LBBB (left bundle branch block). who presents with right-sided weakness, with a good baseline per family of living independently and driving, presented to [facility] via EMS after family noted patient to be acutely weak on the right side with right facial droop and aphasia.  Underwent thrombectomy   Assessment / Plan / Recommendation Clinical Impression  Pt demonstrates dysarthria and cognitive impairment; she is disoriented after repeated teaching of orientation.  Pt has poor short term memory. One moment she is talking to me as if im at her house with her, then asking when she will go home over and over. Question cues and errorless learning ineffective today. Repeated cues for increased volume and overarticulation not beneficial. Pt inattentive to right side, unable to verbalize awareness of paresis or find her arm with her left. Pt was able to follow commands, name items and language, though difficult to hear, seemed relatively accurate. Recommend AIR at d/c to address dysphagia and cognition if family available for full supervision. No one present today.    SLP Assessment  SLP Recommendation/Assessment: Patient needs continued Speech Lanaguage Pathology Services SLP Visit Diagnosis: Dysarthria and anarthria (R47.1);Cognitive communication deficit (R41.841)    Recommendations for follow up therapy are one component of a multi-disciplinary discharge planning process, led by the attending physician.  Recommendations may be updated based on patient status, additional functional criteria and insurance authorization.    Follow Up Recommendations  Acute inpatient rehab (3hours/day)    Assistance Recommended at Discharge     Functional Status Assessment    Frequency and Duration min 2x/week  2 weeks      SLP Evaluation Cognition  Overall Cognitive Status: Impaired/Different from baseline Arousal/Alertness: Awake/alert Orientation Level: Oriented to person;Disoriented to place;Disoriented to time;Disoriented to situation Attention: Focused;Sustained Focused Attention: Appears intact Sustained Attention: Impaired Sustained Attention Impairment: Verbal basic Memory: Impaired Memory Impairment: Decreased recall of new information;Storage deficit;Retrieval deficit Awareness: Impaired Awareness Impairment: Intellectual impairment;Emergent impairment Problem Solving: Impaired Problem Solving Impairment: Verbal basic;Functional basic Executive Function:  Reasoning;Sequencing;Self Monitoring;Self Correcting Behaviors: Perseveration       Comprehension  Auditory Comprehension Overall Auditory Comprehension: Appears within functional limits for tasks assessed    Expression Verbal Expression Overall Verbal Expression: Appears within functional limits for tasks assessed   Oral / Motor  Oral Motor/Sensory Function Overall Oral Motor/Sensory Function: Mild impairment Facial ROM: Reduced right;Suspected CN VII (facial) dysfunction Facial Symmetry: Abnormal symmetry right;Suspected CN VII (facial) dysfunction Facial Strength: Reduced right;Suspected CN VII (facial) dysfunction Facial Sensation: Within Functional Limits Lingual ROM: Reduced right;Suspected CN XII (hypoglossal) dysfunction Lingual Symmetry: Within Functional Limits Lingual Strength: Reduced Motor Speech Overall Motor Speech: Impaired Respiration: Within functional limits Phonation: Low vocal intensity Articulation: Impaired Level of Impairment: Word Intelligibility: Intelligibility reduced Word: 25-49% accurate Phrase: 0-24% accurate Sentence: 0-24% accurate Conversation: 0-24% accurate Motor Planning: Witnin functional limits Motor Speech Errors: Consistent;Unaware            Nacole Fluhr, Riley Nearing 04/05/2023, 11:09 AM

## 2023-04-06 ENCOUNTER — Encounter (HOSPITAL_COMMUNITY): Payer: Self-pay | Admitting: Student in an Organized Health Care Education/Training Program

## 2023-04-06 DIAGNOSIS — I4819 Other persistent atrial fibrillation: Secondary | ICD-10-CM | POA: Diagnosis not present

## 2023-04-06 DIAGNOSIS — I63032 Cerebral infarction due to thrombosis of left carotid artery: Secondary | ICD-10-CM | POA: Diagnosis not present

## 2023-04-06 LAB — CBC
HCT: 37.1 % (ref 36.0–46.0)
Hemoglobin: 12.2 g/dL (ref 12.0–15.0)
MCH: 28.4 pg (ref 26.0–34.0)
MCHC: 32.9 g/dL (ref 30.0–36.0)
MCV: 86.5 fL (ref 80.0–100.0)
Platelets: 134 10*3/uL — ABNORMAL LOW (ref 150–400)
RBC: 4.29 MIL/uL (ref 3.87–5.11)
RDW: 12.8 % (ref 11.5–15.5)
WBC: 8.6 10*3/uL (ref 4.0–10.5)
nRBC: 0 % (ref 0.0–0.2)

## 2023-04-06 LAB — BASIC METABOLIC PANEL
Anion gap: 15 (ref 5–15)
BUN: 9 mg/dL (ref 8–23)
CO2: 22 mmol/L (ref 22–32)
Calcium: 10 mg/dL (ref 8.9–10.3)
Chloride: 103 mmol/L (ref 98–111)
Creatinine, Ser: 0.76 mg/dL (ref 0.44–1.00)
GFR, Estimated: >60 mL/min (ref >60)
Glucose, Bld: 108 mg/dL — ABNORMAL HIGH (ref 70–99)
Potassium: 3.9 mmol/L (ref 3.5–5.1)
Sodium: 140 mmol/L (ref 135–145)

## 2023-04-06 LAB — HEPARIN LEVEL (UNFRACTIONATED)
Heparin Unfractionated: 0.33 [IU]/mL (ref 0.30–0.70)
Heparin Unfractionated: 0.18 [IU]/mL — ABNORMAL LOW (ref 0.30–0.70)

## 2023-04-06 LAB — GLUCOSE, CAPILLARY
Glucose-Capillary: 141 mg/dL — ABNORMAL HIGH (ref 70–99)
Glucose-Capillary: 112 mg/dL — ABNORMAL HIGH (ref 70–99)
Glucose-Capillary: 108 mg/dL — ABNORMAL HIGH (ref 70–99)

## 2023-04-06 MED ORDER — ORAL CARE MOUTH RINSE
15.0000 mL | OROMUCOSAL | Status: DC
  Administered 2023-04-06 – 2023-04-12 (×23): 15 mL via OROMUCOSAL

## 2023-04-06 MED ORDER — ORAL CARE MOUTH RINSE: 15.0000 mL | OROMUCOSAL | Status: DC | PRN

## 2023-04-06 NOTE — Plan of Care (Signed)
  Problem: Education: Goal: Knowledge of disease or condition will improve Outcome: Progressing Goal: Knowledge of secondary prevention will improve (MUST DOCUMENT ALL) Outcome: Progressing Goal: Knowledge of patient specific risk factors will improve Loraine Leriche N/A or DELETE if not current risk factor) Outcome: Progressing   Problem: Ischemic Stroke/TIA Tissue Perfusion: Goal: Complications of ischemic stroke/TIA will be minimized Outcome: Progressing   Problem: Coping: Goal: Will verbalize positive feelings about self Outcome: Progressing Goal: Will identify appropriate support needs Outcome: Progressing   Problem: Health Behavior/Discharge Planning: Goal: Ability to manage health-related needs will improve Outcome: Progressing Goal: Goals will be collaboratively established with patient/family Outcome: Progressing   Problem: Self-Care: Goal: Ability to participate in self-care as condition permits will improve Outcome: Progressing Goal: Verbalization of feelings and concerns over difficulty with self-care will improve Outcome: Progressing Goal: Ability to communicate needs accurately will improve Outcome: Progressing   Problem: Education: Goal: Understanding of CV disease, CV risk reduction, and recovery process will improve Outcome: Progressing Goal: Individualized Educational Video(s) Outcome: Progressing   Problem: Activity: Goal: Ability to return to baseline activity level will improve Outcome: Progressing   Problem: Cardiovascular: Goal: Ability to achieve and maintain adequate cardiovascular perfusion will improve Outcome: Progressing Goal: Vascular access site(s) Level 0-1 will be maintained Outcome: Progressing   Problem: Health Behavior/Discharge Planning: Goal: Ability to safely manage health-related needs after discharge will improve Outcome: Progressing

## 2023-04-06 NOTE — Progress Notes (Signed)
OT Cancellation Note  Patient Details Name: [name] MRN: [id number] DOB: 05/20/1941   Cancelled Treatment:    Reason Eval/Treat Not Completed: Active bedrest order Ot will check back when appropriate as patient has orders for strict bedrest  Mateo Flow 04/06/2023, 7:32 AM

## 2023-04-06 NOTE — Progress Notes (Signed)
Assisted patient to room [phone] with patient's blue fleece blanket (only personal belongings at bedside) and accompanied by patient's son and grandson.

## 2023-04-06 NOTE — Progress Notes (Signed)
Speech Language Pathology Treatment: Dysphagia;Cognitive-Linquistic  Patient Details Name: INA RIEVES MRN: [id number] DOB: [birth year] Today's Date: 04/06/2023 Time: 4166-0630 SLP Time Calculation (min) (ACUTE ONLY): 15 min  Assessment / Plan / Recommendation Clinical Impression  Pt presents similarly as yesterday; she is tolerating purees and nectar thick liquid per RN who did have to assist with feeding> RN reported no significant right buccal pocketing. Anterior spillage minimal. WIth SLP pt needed cues to attend to self feeding, but did take straw sips fo nectar thick water with out coughing or anterior spillage. Pt similarly disoriented as yesterday. SLP provided teaching for orientation to place, situation and month with immediate request for recall with less than 50% success in 5 trials. Pts has no awareness of her deficits or situation. Will need full supervision at d/c and may have significant difficulty carrying over any strategies given cognitive impairment. Family so far has not been present to discuss support.   HPI HPI: LIZZETH BUSE is a 82 y.o. female  has a past medical history of Chest pain, Coronary artery disease, Hypertension, and LBBB (left bundle branch block). who presents with right-sided weakness, with a good baseline per family of living independently and driving, presented to [facility] via EMS after family noted patient to be acutely weak on the right side with right facial droop and aphasia.  Underwent thrombectomy      SLP Plan  Continue with current plan of care      Recommendations for follow up therapy are one component of a multi-disciplinary discharge planning process, led by the attending physician.  Recommendations may be updated based on patient status, additional functional criteria and insurance authorization.    Recommendations  Diet recommendations: Dysphagia 1 (puree);Nectar-thick liquid Liquids provided via: Straw;Cup Medication  Administration: Whole meds with puree Supervision: Staff to assist with self feeding;Full supervision/cueing for compensatory strategies Compensations: Lingual sweep for clearance of pocketing;Small sips/bites;Slow rate Postural Changes and/or Swallow Maneuvers: Seated upright 90 degrees                  Oral care before and after PO     Dysarthria and anarthria (R47.1);Cognitive communication deficit (Z60.109)     Continue with current plan of care     Elizabeth Paulsen, Riley Nearing  04/06/2023, 2:51 PM

## 2023-04-06 NOTE — Evaluation (Signed)
Physical Therapy Evaluation Patient Details Name: [name] MRN: [id number] DOB: 03/23/1941 Today's Date: 04/06/2023  History of Present Illness  Patient is 82 y.o. female who presented 04/04/23 with acute onset right-sided weakness, right facial droop and aphasia.  CT angiogram revealed left M1 occlusion, pt s/p successful thrombectomy on 10/20. 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 PMH significant for HTN, LBBB, CAD.   Clinical Impression  JEREMIAH OSTHEIMER is 82 y.o. female admitted with above HPI and diagnosis. Patient is currently limited by functional impairments below (see PT problem list). Patient lives with son and is independent with no AD for mobility and was performing all ADL's and driving at baseline/PTA. Patient currently requires min assist for bed mobility and Mod Assist +2 for sit<>stand and bed<>chair transfers. Manual blocking required to prevent buckling on Rt LE in stance and mod assist to weight shift for step initiation. Patient will benefit from continued skilled PT interventions to address impairments and progress independence with mobility. Patient will benefit from intensive inpatient follow up therapy, >3 hours/day. Acute PT will follow and progress as able.         If plan is discharge home, recommend the following: Two people to help with walking and/or transfers;A lot of help with bathing/dressing/bathroom;Assistance with cooking/housework;Assist for transportation;Help with stairs or ramp for entrance;Supervision due to cognitive status;Direct supervision/assist for medications management   Can travel by private vehicle        Equipment Recommendations  (TBD at next venue)  Recommendations for Other Services  Rehab consult    Functional Status Assessment Patient has had a recent decline in  their functional status and demonstrates the ability to make significant improvements in function in a reasonable and predictable amount of time.     Precautions / Restrictions Precautions Precautions: Fall Precaution Comments: SBP <180 Restrictions Weight Bearing Restrictions: No      Mobility  Bed Mobility Overal bed mobility: Needs Assistance Bed Mobility: Supine to Sit     Supine to sit: Used rails, HOB elevated, Min assist, Mod assist     General bed mobility comments: pt initiated moving towards long sitting to move upright in bed, able to initiate bring Lt LE off EOB, min assist to adduct Rt LE to edge. Cues for use of bed rail and min-mod assist to scoot anteriorly at EOB to palce feet on floor.    Transfers Overall transfer level: Needs assistance Equipment used: 2 person hand held assist Transfers: Sit to/from Stand, Bed to chair/wheelchair/BSC Sit to Stand: +2 physical assistance, +2 safety/equipment, Mod assist   Step pivot transfers: Mod assist, +2 physical assistance, +2 safety/equipment       General transfer comment: 2HHA with manual blocking of Rt LE in standing to facilitate knee extension. Modd assist to shift Rt/Lt for stepping Lt Bed>chair>BSC and for return to chair from [facility].    Ambulation/Gait                  Stairs            Wheelchair Mobility     Tilt Bed    Modified Rankin (Stroke Patients Only) Modified Rankin (Stroke Patients Only) Pre-Morbid Rankin Score: No symptoms Modified Rankin: Moderately severe disability     Balance Overall balance assessment: Needs assistance Sitting-balance support: Feet supported, Bilateral upper extremity supported, Single extremity supported Sitting balance-Leahy  Scale: Fair Sitting balance - Comments: CGA at EOB.   Standing balance support: Bilateral upper extremity supported, During functional activity Standing balance-Leahy Scale: Poor                                Pertinent Vitals/Pain Pain Assessment Pain Assessment: Faces Faces Pain Scale: No hurt Pain Intervention(s): Limited activity within patient's tolerance, Monitored during session, Repositioned    Home Living Family/patient expects to be discharged to:: Private residence Living Arrangements: Children Available Help at Discharge: Family Type of Home: House Home Access: Stairs to enter Entrance Stairs-Rails: None     Home Layout: One level Home Equipment: None Additional Comments: wears glasses for reading    Prior Function Prior Level of Function : Independent/Modified Independent;Driving             Mobility Comments: no use of AD ADLs Comments: does all the home making and cooking. waas taking care of her neice during the week     Extremity/Trunk Assessment   Upper Extremity Assessment Upper Extremity Assessment: Defer to OT evaluation    Lower Extremity Assessment Lower Extremity Assessment: RLE deficits/detail;LLE deficits/detail RLE Deficits / Details: 3/5 knee ext/flex; 3+/5 Df, 3-/5 PF, 3-/5 hip flex or less RLE Coordination: decreased fine motor;decreased gross motor LLE Deficits / Details: grossly 3+/5 throughout or better LLE Coordination: decreased gross motor    Cervical / Trunk Assessment Cervical / Trunk Assessment: Kyphotic  Communication   Communication Communication: Difficulty communicating thoughts/reduced clarity of speech;Difficulty following commands/understanding Following commands: Follows one step commands consistently;Follows one step commands with increased time;Follows multi-step commands inconsistently Cueing Techniques: Verbal cues;Visual cues  Cognition Arousal: Alert Behavior During Therapy: Flat affect Overall Cognitive Status: Impaired/Different from baseline Area of Impairment: Orientation, Attention, Memory, Following commands, Safety/judgement, Awareness, Problem solving                 Orientation Level:  Disoriented to, Place, Time, Situation Current Attention Level: Focused Memory: Decreased recall of precautions Following Commands: Follows one step commands consistently, Follows one step commands with increased time, Follows multi-step commands inconsistently Safety/Judgement: Decreased awareness of deficits Awareness: Intellectual Problem Solving: Slow processing, Decreased initiation, Difficulty sequencing, Requires verbal cues          General Comments      Exercises     Assessment/Plan    PT Assessment Patient needs continued PT services  PT Problem List Decreased strength;Cardiopulmonary status limiting activity;Decreased knowledge of precautions;Decreased safety awareness;Decreased knowledge of use of DME;Decreased cognition;Decreased coordination;Decreased mobility;Decreased balance;Decreased activity tolerance;Decreased range of motion;Impaired sensation;Impaired tone       PT Treatment Interventions DME instruction;Gait training;Stair training;Functional mobility training;Therapeutic activities;Therapeutic exercise;Balance training;Neuromuscular re-education;Cognitive remediation;Patient/family education;Wheelchair mobility training;Manual techniques    PT Goals (Current goals can be found in the Care Plan section)  Acute Rehab PT Goals Patient Stated Goal: recover mobility/independence PT Goal Formulation: With patient Time For Goal Achievement: 04/20/23 Potential to Achieve Goals: Good    Frequency Min 1X/week     Co-evaluation PT/OT/SLP Co-Evaluation/Treatment: Yes Reason for Co-Treatment: For patient/therapist safety;To address functional/ADL transfers PT goals addressed during session: Balance;Mobility/safety with mobility         AM-PAC PT "6 Clicks" Mobility  Outcome Measure Help needed turning from your back to your side while in a flat bed without using bedrails?: A Little Help needed moving from lying on your back to sitting on the side of a flat  bed without using bedrails?:  A Little Help needed moving to and from a bed to a chair (including a wheelchair)?: A Lot Help needed standing up from a chair using your arms (e.g., wheelchair or bedside chair)?: A Lot Help needed to walk in hospital room?: Total Help needed climbing 3-5 steps with a railing? : Total 6 Click Score: 12    End of Session Equipment Utilized During Treatment: Gait belt Activity Tolerance: Patient tolerated treatment well Patient left: in chair Nurse Communication: Mobility status;Need for lift equipment (+2) PT Visit Diagnosis: Unsteadiness on feet (R26.81);Other abnormalities of gait and mobility (R26.89);Muscle weakness (generalized) (M62.81);Difficulty in walking, not elsewhere classified (R26.2);Other symptoms and signs involving the nervous system (R29.898);Hemiplegia and hemiparesis Hemiplegia - Right/Left: Right Hemiplegia - dominant/non-dominant: Dominant Hemiplegia - caused by: Cerebral infarction    Time: 1125-1151 PT Time Calculation (min) (ACUTE ONLY): 26 min   Charges:   PT Evaluation $PT Eval Moderate Complexity: 1 Mod   PT General Charges $$ ACUTE PT VISIT: 1 Visit         Wynn Maudlin, DPT Acute Rehabilitation Services Office [phone]  04/06/23 1:18 PM

## 2023-04-06 NOTE — Progress Notes (Signed)
PHARMACY - ANTICOAGULATION CONSULT NOTE  Pharmacy Consult for heparin Indication: atrial fibrillation and stroke  Allergies  Allergen Reactions   Codeine Nausea And Vomiting   Penicillins Nausea And Vomiting    Patient Measurements: Height: 5\' 5"  (165.1 cm) Weight: 64 kg (141 lb 1.5 oz) IBW/kg (Calculated) : 57 Heparin Dosing Weight: 64  Vital Signs: Temp: 98.3 F (36.8 C) (10/22 1352) Temp Source: Oral (10/22 1352) BP: 130/54 (10/22 1352) Pulse Rate: 59 (10/22 1352)  Labs: Recent Labs    04/04/23 1005 04/04/23 1018 04/04/23 1710 04/05/23 0755 04/05/23 2102 04/06/23 0721 04/06/23 1021 04/06/23 1500  HGB 13.2 13.6  --  11.4*  --   --  12.2  --   HCT 40.0 40.0  --  34.8*  --   --  37.1  --   PLT 140*  --   --  130*  --   --  134*  --   APTT 24  --   --   --   --   --   --   --   LABPROT 14.2  --   --   --   --   --   --   --   INR 1.1  --   --   --   --   --   --   --   HEPARINUNFRC  --   --   --   --  0.11* 0.33  --  0.18*  CREATININE 0.88 0.80  --  0.79  --  0.76  --   --   CKTOTAL 78  --   --   --   --   --   --   --   TROPONINIHS 10  --  28*  --   --   --   --   --     Estimated Creatinine Clearance: 48.8 mL/min (by C-G formula based on SCr of 0.76 mg/dL).   Medical History: Past Medical History:  Diagnosis Date   Chest pain    Coronary artery disease    Hypertension    LBBB (left bundle branch block)     Assessment: 82yoF who admitted on 10/20 as code stroke, underwent revascularization w/ NIR on 10/20 1419 given presentation with LVO of M1. Patient with new aflutter, requiring anticoagulation given cardioembolic etiology. CBC relatively stable this AM from admission.  Heparin level down to subtherapeutic (0.18) on infusion at 950 units/hr. No issues with line or bleeding reported per RN.  Goal of Therapy:  Heparin level 0.3-0.5 units/ml Monitor platelets by anticoagulation protocol: Yes   Plan:  Increase heparin infusion to 1050 units/hr F/u 8  hr heparin level  Christoper Fabian, PharmD, BCPS Please see amion for complete clinical pharmacist phone list 04/06/2023,4:05 PM

## 2023-04-06 NOTE — Progress Notes (Signed)
Patient transferred from 4N, alert and oriented X2, tele box connected, will continue to monitor

## 2023-04-06 NOTE — Progress Notes (Signed)
PHARMACY - ANTICOAGULATION CONSULT NOTE  Pharmacy Consult for heparin Indication: atrial fibrillation and stroke  Allergies  Allergen Reactions   Codeine Nausea And Vomiting   Penicillins Nausea And Vomiting    Patient Measurements: Height: 5\' 5"  (165.1 cm) Weight: 64 kg (141 lb 1.5 oz) IBW/kg (Calculated) : 57 Heparin Dosing Weight: 64  Vital Signs: Temp: 97.7 F (36.5 C) (10/22 0800) Temp Source: Oral (10/22 0800) BP: 135/52 (10/22 1000) Pulse Rate: 67 (10/22 1000)  Labs: Recent Labs    04/04/23 1005 04/04/23 1018 04/04/23 1710 04/05/23 0755 04/05/23 2102 04/06/23 0721  HGB 13.2 13.6  --  11.4*  --   --   HCT 40.0 40.0  --  34.8*  --   --   PLT 140*  --   --  130*  --   --   APTT 24  --   --   --   --   --   LABPROT 14.2  --   --   --   --   --   INR 1.1  --   --   --   --   --   HEPARINUNFRC  --   --   --   --  0.11* 0.33  CREATININE 0.88 0.80  --  0.79  --  0.76  CKTOTAL 78  --   --   --   --   --   TROPONINIHS 10  --  28*  --   --   --     Estimated Creatinine Clearance: 48.8 mL/min (by C-G formula based on SCr of 0.76 mg/dL).   Medical History: Past Medical History:  Diagnosis Date   Chest pain    Coronary artery disease    Hypertension    LBBB (left bundle branch block)     Assessment: 82yoF who admitted on 10/20 as code stroke, underwent revascularization w/ NIR on 10/20 1419 given presentation with LVO of M1. Patient with new aflutter, requiring anticoagulation given cardioembolic etiology. CBC relatively stable this AM from admission.  Heparin level therapeutic at 0.33. No issues with line or bleeding reported per RN.  Goal of Therapy:  Heparin level 0.3-0.5 units/ml Monitor platelets by anticoagulation protocol: Yes   Plan:  Continue heparin infusion at 950 units/hr F/u 8 hr heparin level, then daily thereafter if remains therapeutic F/u daily CBC Transition to apixaban per neurology  Rutherford Nail, PharmD PGY2 Critical Care Pharmacy  Resident 04/06/2023,11:00 AM

## 2023-04-06 NOTE — Evaluation (Signed)
Occupational Therapy Evaluation Patient Details Name: [name] MRN: [id number] DOB: Sep 28, 1940 Today's Date: 04/06/2023   History of Present Illness 82 y.o. female who presented 04/04/23 with acute onset right-sided weakness, right facial droop and aphasia.  CT angiogram=  left M1 occlusion  s/p thrombectomy on 10/20. 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 PMH significant for HTN, LBBB, CAD.   Clinical Impression   PT admitted with CVA. Pt currently with functional limitiations due to the deficits listed below (see OT problem list). Pt at baseline indep adls, driving and cooking meals for the home. Pt currently with R inattention and R UE weakness. OT to monitor for R UE splinting needs. Pt will benefit from skilled OT to increase their independence and safety with adls and balance to allow discharge Patient will benefit from intensive inpatient follow up therapy, >3 hours/day .        If plan is discharge home, recommend the following: A lot of help with walking and/or transfers;A lot of help with bathing/dressing/bathroom    Functional Status Assessment  Patient has had a recent decline in their functional status and demonstrates the ability to make significant improvements in function in a reasonable and predictable amount of time.  Equipment Recommendations  BSC/3in1;Wheelchair (measurements OT);Wheelchair cushion (measurements OT)    Recommendations for Other Services Rehab consult;Speech consult     Precautions / Restrictions Precautions Precautions: Fall Precaution Comments: SBP <180 Restrictions Weight Bearing Restrictions: No      Mobility Bed Mobility Overal bed mobility: Needs Assistance Bed Mobility: Supine to Sit     Supine to sit: Used rails, HOB elevated, Min assist, Mod assist      General bed mobility comments: pt initiated moving towards long sitting to move upright in bed, able to initiate bring Lt LE off EOB, min assist to adduct Rt LE to edge. Cues for use of bed rail and min-mod assist to scoot anteriorly at EOB to palce feet on floor.    Transfers Overall transfer level: Needs assistance Equipment used: 2 person hand held assist Transfers: Sit to/from Stand, Bed to chair/wheelchair/BSC Sit to Stand: +2 physical assistance, +2 safety/equipment, Mod assist     Step pivot transfers: Mod assist, +2 physical assistance, +2 safety/equipment     General transfer comment: 2HHA with manual blocking of Rt LE in standing to facilitate knee extension. Mod A to shift Rt<>Lt for stepping Lt Bed>chair>BSC and for return to chair from [facility].      Balance Overall balance assessment: Needs assistance Sitting-balance support: Feet supported, Bilateral upper extremity supported, Single extremity supported Sitting balance-Leahy Scale: Fair Sitting balance - Comments: CGA at EOB.   Standing balance support: Bilateral upper extremity supported, During functional activity Standing balance-Leahy Scale: Poor                             ADL either performed or assessed with clinical judgement   ADL Overall ADL's : Needs assistance/impaired Eating/Feeding: Minimal assistance Eating/Feeding Details (indicate cue type and reason): needs cues for R side pocketing of liquids during session. drinking from cup with straw. son present and educated on use of suction if needed Grooming: Wash/dry face;Minimal assistance Grooming Details (indicate cue type and reason): using L UE Upper Body Bathing: Moderate assistance   Lower Body Bathing: Maximal assistance  Upper Body Dressing : Moderate assistance   Lower Body Dressing: Maximal assistance   Toilet Transfer: +2 for physical assistance;Moderate assistance;Stand-pivot;+2 for safety/equipment;BSC/3in1   Toileting-  Clothing Manipulation and Hygiene: Maximal assistance;Sit to/from stand         General ADL Comments: progressed from bed to chair to bsc to chair     Vision Baseline Vision/History: 1 Wears glasses Vision Assessment?: Wears glasses for reading;Vision impaired- to be further tested in functional context Additional Comments: pt with verbal cues turning head to the R visual field. pt does show some R inattention     Perception Perception: Impaired Preception Impairment Details: Inattention/Neglect Perception-Other Comments: R side of body and visual field   Praxis         Pertinent Vitals/Pain Pain Assessment Pain Assessment: No/denies pain     Extremity/Trunk Assessment Upper Extremity Assessment Upper Extremity Assessment: Right hand dominant;RUE deficits/detail RUE Deficits / Details: activation of the shoulder, activation of shoulder flexion, shoulder abduction, digit flexion. pt lacks digit extension, wrist extension and elbow flexion. OT to watch for splinting needs and hold on splinting at this time as pt was activating R UE some RUE Sensation: decreased light touch;decreased proprioception RUE Coordination: decreased fine motor;decreased gross motor   Lower Extremity Assessment Lower Extremity Assessment: Defer to PT evaluation RLE Deficits / Details: 3/5 knee ext/flex; 3+/5 Df, 3-/5 PF, 3-/5 hip flex or less RLE Coordination: decreased fine motor;decreased gross motor LLE Deficits / Details: grossly 3+/5 throughout or better LLE Coordination: decreased gross motor   Cervical / Trunk Assessment Cervical / Trunk Assessment: Kyphotic   Communication Communication Communication: Difficulty following commands/understanding Following commands: Follows one step commands with increased time Cueing Techniques: Verbal cues;Gestural cues;Tactile cues;Visual cues   Cognition Arousal: Alert Behavior During Therapy: Flat affect Overall Cognitive Status: Impaired/Different  from baseline Area of Impairment: Orientation, Attention, Memory, Following commands, Safety/judgement, Awareness, Problem solving                 Orientation Level: Disoriented to, Place, Time, Situation Current Attention Level: Focused Memory: Decreased recall of precautions Following Commands: Follows one step commands consistently, Follows one step commands with increased time, Follows multi-step commands inconsistently Safety/Judgement: Decreased awareness of safety, Decreased awareness of deficits Awareness: Intellectual Problem Solving: Slow processing General Comments: pt reports that she is 82 years old, pt reports taht she is at a different town initially. pt only able to state the month of birth "april" pt correctly says Son when asked whose Fayrene Fearing, grandson when asked whose Apolinar Junes. pt shows awareness to family. pt able to recall city [place] after sitting up ~5 minutes     General Comments  VSS RA    Exercises Exercises: Other exercises Other Exercises Other Exercises: attempts at L UE use to help recognize R UE Other Exercises: attempts at [facility] R UE with visual field use   Shoulder Instructions      Home Living Family/patient expects to be discharged to:: Private residence Living Arrangements: Children Available Help at Discharge: Family Type of Home: House Home Access: Stairs to enter Secretary/administrator of Steps: 3 Entrance Stairs-Rails: None Home Layout: One level     Bathroom Shower/Tub: Chief Strategy Officer: Standard Bathroom Accessibility: Yes   Home Equipment: None   Additional Comments: wears glasses for reading  Lives With: Son    Prior Functioning/Environment Prior Level of Function : Independent/Modified Independent;Driving             Mobility Comments: no use of  AD ADLs Comments: does all the home making and cooking. waas taking care of her neice during the week        OT Problem List: Decreased  strength;Decreased range of motion;Decreased activity tolerance;Impaired balance (sitting and/or standing);Impaired vision/perception;Decreased coordination;Decreased cognition;Decreased safety awareness;Decreased knowledge of use of DME or AE;Decreased knowledge of precautions;Impaired sensation;Impaired tone;Impaired UE functional use      OT Treatment/Interventions: Self-care/ADL training;Therapeutic exercise;Neuromuscular education;Energy conservation;DME and/or AE instruction;Manual therapy;Modalities;Splinting;Therapeutic activities;Cognitive remediation/compensation;Balance training;Visual/perceptual remediation/compensation;Patient/family education    OT Goals(Current goals can be found in the care plan section) Acute Rehab OT Goals Patient Stated Goal: asking for cup with straw on table OT Goal Formulation: With patient/family Time For Goal Achievement: 04/20/23 Potential to Achieve Goals: Good  OT Frequency: Min 1X/week    Co-evaluation PT/OT/SLP Co-Evaluation/Treatment: Yes Reason for Co-Treatment: Necessary to address cognition/behavior during functional activity;For patient/therapist safety;To address functional/ADL transfers PT goals addressed during session: Balance;Mobility/safety with mobility OT goals addressed during session: ADL's and self-care;Proper use of Adaptive equipment and DME;Strengthening/ROM      AM-PAC OT "6 Clicks" Daily Activity     Outcome Measure Help from another person eating meals?: A Little Help from another person taking care of personal grooming?: A Lot Help from another person toileting, which includes using toliet, bedpan, or urinal?: A Lot Help from another person bathing (including washing, rinsing, drying)?: A Lot Help from another person to put on and taking off regular upper body clothing?: A Lot Help from another person to put on and taking off regular lower body clothing?: A Lot 6 Click Score: 13   End of Session Equipment Utilized  During Treatment: Gait belt Nurse Communication: Mobility status;Precautions  Activity Tolerance: Patient tolerated treatment well Patient left: in chair;with call bell/phone within reach;with chair alarm set;with family/visitor present  OT Visit Diagnosis: Unsteadiness on feet (R26.81)                Time: 0981-1914 OT Time Calculation (min): 25 min Charges:  OT General Charges $OT Visit: 1 Visit OT Evaluation $OT Eval Moderate Complexity: 1 Mod  sinusitis   Mateo Flow 04/06/2023, 1:33 PM

## 2023-04-06 NOTE — Plan of Care (Signed)
  Problem: Nutrition: Goal: Risk of aspiration will decrease Outcome: Progressing Goal: Dietary intake will improve Outcome: Progressing   Problem: Activity: Goal: Ability to return to baseline activity level will improve Outcome: Progressing

## 2023-04-06 NOTE — Progress Notes (Signed)
Cardiology Progress Note  Patient ID: [name] MRN: [id number] DOB: [birth year] Date of Encounter: 04/06/2023  Primary Cardiologist: None  Subjective   Chief Complaint: None.   HPI: Afib with SVR. No symptoms.   ROS:  All other ROS reviewed and negative. Pertinent positives noted in the HPI.     Inpatient Medications  Scheduled Meds:  atorvastatin  80 mg Oral Daily   Chlorhexidine Gluconate Cloth  6 each Topical Daily   losartan  100 mg Oral Daily   mouth rinse  15 mL Mouth Rinse 4 times per day   pantoprazole  40 mg Oral QHS   Continuous Infusions:  heparin 950 Units/hr (04/06/23 0800)   PRN Meds: acetaminophen **OR** acetaminophen (TYLENOL) oral liquid 160 mg/5 mL **OR** acetaminophen, hydrALAZINE, labetalol, mouth rinse, mouth rinse, senna-docusate   Vital Signs   Vitals:   04/06/23 0730 04/06/23 0800 04/06/23 0830 04/06/23 0915  BP: (!) 167/74 (!) 168/64 (!) 120/59 (!) 112/53  Pulse: (!) 44 (!) 44 (!) 50 69  Resp: 14 (!) 22 14   Temp:  97.7 F (36.5 C)    TempSrc:  Oral    SpO2: 99% 98% 99%   Weight:      Height:        Intake/Output Summary (Last 24 hours) at 04/06/2023 0916 Last data filed at 04/06/2023 0800 Gross per 24 hour  Intake 396.86 ml  Output 885 ml  Net -488.14 ml      04/04/2023   11:00 AM 06/20/2020    1:13 PM 05/31/2017    2:23 PM  Last 3 Weights  Weight (lbs) 141 lb 1.5 oz 146 lb 4.8 oz 167 lb  Weight (kg) 64 kg 66.361 kg 75.751 kg      Telemetry  Overnight telemetry shows Afib 40-50 bpm, which I personally reviewed.    Physical Exam   Vitals:   04/06/23 0730 04/06/23 0800 04/06/23 0830 04/06/23 0915  BP: (!) 167/74 (!) 168/64 (!) 120/59 (!) 112/53  Pulse: (!) 44 (!) 44 (!) 50 69  Resp: 14 (!) 22 14   Temp:  97.7 F (36.5 C)    TempSrc:  Oral    SpO2: 99% 98% 99%   Weight:      Height:        Intake/Output Summary (Last 24 hours) at 04/06/2023 0916 Last data filed at 04/06/2023 0800 Gross per 24 hour  Intake  396.86 ml  Output 885 ml  Net -488.14 ml       04/04/2023   11:00 AM 06/20/2020    1:13 PM 05/31/2017    2:23 PM  Last 3 Weights  Weight (lbs) 141 lb 1.5 oz 146 lb 4.8 oz 167 lb  Weight (kg) 64 kg 66.361 kg 75.751 kg    Body mass index is 23.48 kg/m.  General: Ill-appearing Head: Atraumatic, normal size  Eyes: PEERLA, EOMI  Neck: Supple, no JVD Endocrine: No thryomegaly Cardiac: Normal S1, S2; irregular rhythm Lungs: Clear to auscultation bilaterally, no wheezing, rhonchi or rales  Abd: Soft, nontender, no hepatomegaly  Ext: No edema, pulses 2+ Musculoskeletal: No deformities, Skin: Warm and dry, no rashes   Neuro: Alert, awake, follows commands  Labs  High Sensitivity Troponin:   Recent Labs  Lab 04/04/23 1005 04/04/23 1710  TROPONINIHS 10 28*     Cardiac EnzymesNo results for input(s): "TROPONINI" in the last 168 hours. No results for input(s): "TROPIPOC" in the last 168 hours.  Chemistry Recent Labs  Lab 04/04/23 1005 04/04/23 1018  04/05/23 0755 04/06/23 0721  NA 135 140 139 140  K 3.7 3.9 3.4* 3.9  CL 103 106 109 103  CO2 24  --  21* 22  GLUCOSE 169* 165* 111* 108*  BUN 13 12 8 9   CREATININE 0.88 0.80 0.79 0.76  CALCIUM 8.8*  --  8.9 10.0  PROT 7.2  --   --   --   ALBUMIN 4.0  --   --   --   AST 16  --   --   --   ALT 12  --   --   --   ALKPHOS 50  --   --   --   BILITOT 0.7  --   --   --   GFRNONAA >60  --  >60 >60  ANIONGAP 8  --  9 15    Hematology Recent Labs  Lab 04/04/23 1005 04/04/23 1018 04/05/23 0755  WBC 7.5  --  9.5  RBC 4.56  --  4.05  HGB 13.2 13.6 11.4*  HCT 40.0 40.0 34.8*  MCV 87.7  --  85.9  MCH 28.9  --  28.1  MCHC 33.0  --  32.8  RDW 12.6  --  12.7  PLT 140*  --  130*   BNP Recent Labs  Lab 04/04/23 1005  BNP 228.0*    DDimer No results for input(s): "DDIMER" in the last 168 hours.   Radiology  MR BRAIN WO CONTRAST  Result Date: 04/05/2023 CLINICAL DATA:  Stroke, follow up EXAM: MRI HEAD WITHOUT CONTRAST  TECHNIQUE: Multiplanar, multiecho pulse sequences of the brain and surrounding structures were obtained without intravenous contrast. COMPARISON:  CT head 04/04/2023 FINDINGS: Brain: Acute left MCA territory infarct involving the insula, left temporal lobe in the corona radiata. There is a additional punctate acute infarcts in the right parietal lobe. There is susceptibility artifact in the anterior left temporal lobe, favored to represent a small petechial hemorrhage. No mass effect. No mass lesion. No hydrocephalus. No extra-axial fluid collection Vascular: Normal flow voids. Skull and upper cervical spine: Normal marrow signal. Sinuses/Orbits: No middle ear or mastoid effusion. Paranasal sinuses are clear Other: None. IMPRESSION: 1. Acute left MCA territory infarct involving the insula, left temporal lobe and corona radiata. Susceptibility artifact in the anterior left temporal lobe, favored to represent a small petechial hemorrhage. No mass effect. 2. Additional punctate embolic acute infarcts in the right parietal lobe. These results will be called to the ordering clinician or representative by the Radiologist Assistant, and communication documented in the PACS or [employer]. Electronically Signed   By: Lorenza Cambridge M.D.   On: 04/05/2023 12:06   ECHOCARDIOGRAM COMPLETE  Result Date: 04/05/2023    ECHOCARDIOGRAM REPORT   Patient Name:   [name] Date of Exam: 04/05/2023 Medical Rec #:  [id number]        Height:       65.0 in Accession #:    [id number]       Weight:       141.1 lb Date of Birth:  28-Nov-1940         BSA:          1.706 m Patient Age:    82 years         BP:           159/62 mmHg Patient Gender: F                HR:  64 bpm. Exam Location:  Inpatient Procedure: 2D Echo, Cardiac Doppler and Color Doppler Indications:    Stroke I63.9  History:        Patient has prior history of Echocardiogram examinations, most                 recent 03/25/2017. Angina and CAD, Stroke,  Arrythmias:LBBB,                 Signs/Symptoms:Chest Pain; Risk Factors:Hypertension and                 Dyslipidemia.  Sonographer:    Lucendia Herrlich Referring Phys: Kalman Drape WOLFE IMPRESSIONS  1. Left ventricular ejection fraction, by estimation, is 45 to 50%. The left ventricle has mildly decreased function. The left ventricle demonstrates global hypokinesis. Left ventricular diastolic function could not be evaluated.  2. Right ventricular systolic function is normal. The right ventricular size is normal.  3. Left atrial size was mildly dilated.  4. Right atrial size was mildly dilated.  5. The mitral valve is normal in structure. Trivial mitral valve regurgitation. No evidence of mitral stenosis.  6. The aortic valve is tricuspid. There is mild calcification of the aortic valve. Aortic valve regurgitation is not visualized. No aortic stenosis is present.  7. The inferior vena cava is normal in size with <50% respiratory variability, suggesting right atrial pressure of 8 mmHg. FINDINGS  Left Ventricle: Left ventricular ejection fraction, by estimation, is 45 to 50%. The left ventricle has mildly decreased function. The left ventricle demonstrates global hypokinesis. The left ventricular internal cavity size was normal in size. There is  no left ventricular hypertrophy. Left ventricular diastolic function could not be evaluated due to atrial fibrillation. Left ventricular diastolic function could not be evaluated. Right Ventricle: The right ventricular size is normal. No increase in right ventricular wall thickness. Right ventricular systolic function is normal. Left Atrium: Left atrial size was mildly dilated. Right Atrium: Right atrial size was mildly dilated. Pericardium: There is no evidence of pericardial effusion. Mitral Valve: The mitral valve is normal in structure. Trivial mitral valve regurgitation. No evidence of mitral valve stenosis. Tricuspid Valve: The tricuspid valve is normal in structure.  Tricuspid valve regurgitation is mild . No evidence of tricuspid stenosis. Aortic Valve: The aortic valve is tricuspid. There is mild calcification of the aortic valve. Aortic valve regurgitation is not visualized. No aortic stenosis is present. Aortic valve peak gradient measures 9.9 mmHg. Pulmonic Valve: The pulmonic valve was normal in structure. Pulmonic valve regurgitation is trivial. No evidence of pulmonic stenosis. Aorta: The aortic root is normal in size and structure. Venous: The inferior vena cava is normal in size with less than 50% respiratory variability, suggesting right atrial pressure of 8 mmHg. IAS/Shunts: No atrial level shunt detected by color flow Doppler.  LEFT VENTRICLE PLAX 2D LVIDd:         5.00 cm   Diastology LVIDs:         3.70 cm   LV e' medial:    7.51 cm/s LV PW:         0.80 cm   LV E/e' medial:  14.2 LV IVS:        0.80 cm   LV e' lateral:   11.00 cm/s LVOT diam:     1.90 cm   LV E/e' lateral: 9.7 LV SV:         54 LV SV Index:   31 LVOT Area:     2.84  cm  RIGHT VENTRICLE            IVC RV S prime:     9.25 cm/s  IVC diam: 2.00 cm TAPSE (M-mode): 1.1 cm LEFT ATRIUM             Index        RIGHT ATRIUM           Index LA diam:        4.10 cm 2.40 cm/m   RA Area:     16.20 cm LA Vol (A2C):   43.3 ml 25.39 ml/m  RA Volume:   41.90 ml  24.57 ml/m LA Vol (A4C):   54.3 ml 31.84 ml/m LA Biplane Vol: 51.0 ml 29.90 ml/m  AORTIC VALVE                 PULMONIC VALVE AV Area (Vmax): 1.82 cm     PR End Diast Vel: 7.51 msec AV Vmax:        157.00 cm/s AV Peak Grad:   9.9 mmHg LVOT Vmax:      100.65 cm/s LVOT Vmean:     63.475 cm/s LVOT VTI:       0.189 m  AORTA Ao Root diam: 2.90 cm Ao Asc diam:  3.30 cm MITRAL VALVE                TRICUSPID VALVE MV Area (PHT): 5.02 cm     TR Peak grad:   42.5 mmHg MV Decel Time: 151 msec     TR Vmax:        326.00 cm/s MR Peak grad: 52.0 mmHg MR Vmax:      360.50 cm/s   SHUNTS MV E velocity: 107.00 cm/s  Systemic VTI:  0.19 m MV A velocity: 39.00 cm/s    Systemic Diam: 1.90 cm MV E/A ratio:  2.74 Arvilla Meres MD Electronically signed by Arvilla Meres MD Signature Date/Time: 04/05/2023/10:28:09 AM    Final    CT HEAD WO CONTRAST ( )  Result Date: 04/04/2023 CLINICAL DATA:  Stroke, follow up EXAM: CT HEAD WITHOUT CONTRAST TECHNIQUE: Contiguous axial images were obtained from the base of the skull through the vertex without intravenous contrast. RADIATION DOSE REDUCTION: This exam was performed according to the departmental dose-optimization program which includes automated exposure control, adjustment of the mA and/or kV according to patient size and/or use of iterative reconstruction technique. COMPARISON:  CT Head October 20, 24. FINDINGS: Motion limited study.  Within this limitation: Brain: No evidence of acute large vascular territory infarction, hemorrhage, hydrocephalus, extra-axial collection or mass lesion/mass effect. Patchy white matter hypodensities, nonspecific but compatible with chronic microvascular ischemic disease. Vascular: No hyperdense vessel.  Calcific atherosclerosis. Skull: No acute fracture. Sinuses/Orbits: Clear sinuses.  No acute orbital findings. IMPRESSION: Motion limited study without evidence of acute intracranial abnormality. Electronically Signed   By: Feliberto Harts M.D.   On: 04/04/2023 12:58   DG Chest Portable 1 View  Result Date: 04/04/2023 CLINICAL DATA:  stroke EXAM: PORTABLE CHEST 1 VIEW COMPARISON:  Chest x-ray dated April 24, 2020 FINDINGS: Mild cardiomegaly. Diffuse interstitial opacities. No evidence of pleural effusion or pneumothorax. IMPRESSION: Diffuse interstitial opacities, likely due to pulmonary edema. Electronically Signed   By: Allegra Lai M.D.   On: 04/04/2023 12:04   CT ANGIO HEAD NECK W WO CM W PERF (CODE STROKE)  Result Date: 04/04/2023 CLINICAL DATA:  Right-sided weakness EXAM: CT ANGIOGRAPHY HEAD AND NECK CT PERFUSION BRAIN TECHNIQUE: Multidetector CT imaging of  the head  and neck was performed using the standard protocol during bolus administration of intravenous contrast. Multiplanar CT image reconstructions and MIPs were obtained to evaluate the vascular anatomy. Carotid stenosis measurements (when applicable) are obtained utilizing NASCET criteria, using the distal internal carotid diameter as the denominator. Multiphase CT imaging of the brain was performed following IV bolus contrast injection. Subsequent parametric perfusion maps were calculated using RAPID software. RADIATION DOSE REDUCTION: This exam was performed according to the departmental dose-optimization program which includes automated exposure control, adjustment of the mA and/or kV according to patient size and/or use of iterative reconstruction technique. CONTRAST:  OMNIPAQUE IOHEXOL 350 MG/ML SOLN COMPARISON:  Earlier today FINDINGS: CTA NECK FINDINGS Aortic arch: Atherosclerosis. Right carotid system: Mixed density plaque at the bifurcation and bulb. No flow reducing stenosis or ulceration Left carotid system: Atheromatous plaque greatest at the bifurcation. No flow reducing stenosis or ulceration. Vertebral arteries: No proximal subclavian stenosis. Atheromatous narrowing of the proximal right vertebral artery causing at least 50% stenosis. No ulceration or beading. Skeleton: No acute finding Other neck: No acute finding Upper chest: Interlobular septal thickening at the apices. Review of the MIP images confirms the above findings CTA HEAD FINDINGS Anterior circulation: Left M1 occlusion with abrupt cut off consistent with emergent embolic disease. There is downstream underfilling. Atheromatous plaque affects the carotid siphons without flow reducing stenosis. Negative for aneurysm Posterior circulation: Fenestrated proximal basilar. The vertebral and basilar arteries are diffusely patent. No branch occlusion, beading, or vascular malformation. Atheromatous irregularity of the posterior cerebral arteries  with moderate narrowing at the left P2 segment. 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 Venous sinuses: Diffusely patent Anatomic variants: None significant Review of the MIP images confirms the above findings CT Brain Perfusion Findings: ASPECTS: 10 CBF (<30%) Volume: 0mL Perfusion (Tmax>6.0s) volume: Case discussed with Dr. Otelia Limes while study was in progress. IMPRESSION: 1. Emergent large vessel occlusion at the left M1 segment. There is 146 cc of penumbra and no core infarct by CT perfusion. 2. No flow reducing stenosis or embolic source seen underlying the embolus. 3. Atherosclerosis with flow reducing stenosis at the right vertebral origin. Notable atheromatous irregularity of the posterior cerebral arteries. 4. 2 mm left superior cerebellar aneurysm. 5. Interstitial edema in the apical lungs. Electronically Signed   By: Tiburcio Pea M.D.   On: 04/04/2023 11:00   CT HEAD CODE STROKE WO CONTRAST  Result Date: 04/04/2023 CLINICAL DATA:  Code stroke. Right upper extremity weakness. Aphasia. EXAM: CT HEAD WITHOUT CONTRAST TECHNIQUE: Contiguous axial images were obtained from the base of the skull through the vertex without intravenous contrast. RADIATION DOSE REDUCTION: This exam was performed according to the departmental dose-optimization program which includes automated exposure control, adjustment of the mA and/or kV according to patient size and/or use of iterative reconstruction technique. COMPARISON:  None Available. FINDINGS: Brain: No evidence of acute infarction, hemorrhage, hydrocephalus, extra-axial collection or mass lesion/mass effect. Mild generalized brain atrophy. Mild chronic small vessel ischemia in the deep cerebral white matter. Vascular: No hyperdense vessel or unexpected calcification. Skull: Normal. Negative for fracture or focal lesion. Sinuses/Orbits: No acute finding. Other: Prelim sent in epic chat. ASPECTS [facility] Stroke Program Early CT Score) - Ganglionic  level infarction (caudate, lentiform nuclei, internal capsule, insula, M1-M3 cortex): 7 - Supraganglionic infarction (M4-M6 cortex): 3 Total score (0-10 with 10 being normal): 10 IMPRESSION: Aging brain without acute finding. Electronically Signed   By: Tiburcio Pea M.D.   On: 04/04/2023 10:22  Cardiac Studies  TTE 04/05/2023  1. Left ventricular ejection fraction, by estimation, is 45 to 50%. The  left ventricle has mildly decreased function. The left ventricle  demonstrates global hypokinesis. Left ventricular diastolic function could  not be evaluated.   2. Right ventricular systolic function is normal. The right ventricular  size is normal.   3. Left atrial size was mildly dilated.   4. Right atrial size was mildly dilated.   5. The mitral valve is normal in structure. Trivial mitral valve  regurgitation. No evidence of mitral stenosis.   6. The aortic valve is tricuspid. There is mild calcification of the  aortic valve. Aortic valve regurgitation is not visualized. No aortic  stenosis is present.   7. The inferior vena cava is normal in size with <50% respiratory  variability, suggesting right atrial pressure of 8 mmHg.   Patient Profile  82 yo F with non-obstructive CAD, LBBB, HTN admitted with acute CVA and new onset Afib.   Assessment & Plan   # New onset A-fib -A-fib with slow ventricular response overnight.  Hold metoprolol.  She may do fine without rate control medications. -Start Eliquis at the discretion of neurology given recent stroke. -EF 45 to 50%.  No symptoms of heart failure. -Plan just for rate control which seems to be without medications at this time.  Can consider outpatient cardioversion once she has recovered from her stroke.  # Heart failure with midrange ejection fraction, EF 45-50% -No signs of volume overload.  Will not tolerate beta-blocker as above. -Continue home ARB. -No need for diuresis. Le[facility] Rams medical therapy for now. -With recent stroke.   Blood pressure goals per neurology.  # Left MCA stroke status post thrombectomy -Per neurology.      For questions or updates, please contact [facility] Please consult [email or web address] for contact info under        Signed, Gerri Spore T. Flora Lipps, MD, [facility]  [facility]  04/06/2023 9:16 AM

## 2023-04-06 NOTE — Plan of Care (Signed)
  Problem: Ischemic Stroke/TIA Tissue Perfusion: Goal: Complications of ischemic stroke/TIA will be minimized Outcome: Progressing   Problem: Coping: Goal: Will identify appropriate support needs Outcome: Progressing   Problem: Self-Care: Goal: Ability to communicate needs accurately will improve Outcome: Progressing   Problem: Nutrition: Goal: Risk of aspiration will decrease Outcome: Progressing Goal: Dietary intake will improve Outcome: Progressing   Problem: Clinical Measurements: Goal: Respiratory complications will improve Outcome: Progressing Goal: Cardiovascular complication will be avoided Outcome: Progressing

## 2023-04-06 NOTE — Progress Notes (Signed)
? ?  Inpatient Rehab Admissions Coordinator : ? ?Per therapy recommendations, patient was screened for CIR candidacy by Blue Ruggerio RN MSN.  At this time patient appears to be a potential candidate for CIR. I will place a rehab consult per protocol for full assessment. Please call me with any questions. ? ?Jerriyah Louis RN MSN ?Admissions Coordinator ?[phone] ?  ?

## 2023-04-06 NOTE — Progress Notes (Addendum)
STROKE TEAM PROGRESS NOTE   BRIEF HPI [name] is a 82 y.o. female with history of CAD, hypertension and left bundle branch block presenting with acute onset right-sided weakness, right facial droop and aphasia.   CT angiogram revealed left M1 occlusion, and patient was taken to interventional radiology for mechanical thrombectomy.  This was successful, with TICI 3 revascularization was achieved after 2 passes.  She was extubated postprocedure and appears to have improved but remains disoriented with some right-sided weakness.   SIGNIFICANT HOSPITAL EVENTS 10/20: Patient presented to hospital, mechanical thrombectomy performed 10/22: Patient transferred out of the ICU  INTERIM HISTORY/SUBJECTIVE Patient has been hemodynamically stable overnight with Tmax of 99.7.  She continues to have some confusion and right-sided weakness, although it is improved today.  She is stable and ready to be transferred out of the ICU today.   OBJECTIVE  CBC    Component Value Date/Time   WBC 8.6 04/06/2023 1021   RBC 4.29 04/06/2023 1021   HGB 12.2 04/06/2023 1021   HCT 37.1 04/06/2023 1021   PLT 134 (L) 04/06/2023 1021   MCV 86.5 04/06/2023 1021   MCH 28.4 04/06/2023 1021   MCHC 32.9 04/06/2023 1021   RDW 12.8 04/06/2023 1021   LYMPHSABS 1.2 04/05/2023 0755   MONOABS 0.4 04/05/2023 0755   EOSABS 0.0 04/05/2023 0755   BASOSABS 0.0 04/05/2023 0755    BMET    Component Value Date/Time   NA 140 04/06/2023 0721   K 3.9 04/06/2023 0721   CL 103 04/06/2023 0721   CO2 22 04/06/2023 0721   GLUCOSE 108 (H) 04/06/2023 0721   BUN 9 04/06/2023 0721   CREATININE 0.76 04/06/2023 0721   CREATININE 1.02 (H) 03/20/2016 1630   CALCIUM 10.0 04/06/2023 0721   GFRNONAA >60 04/06/2023 0721    IMAGING past 24 hours No results found.  Vitals:   04/06/23 0915 04/06/23 1000 04/06/23 1100 04/06/23 1200  BP: (!) 112/53 (!) 135/52 (!) 127/42 133/61  Pulse: 69 67 (!) 56 (!) 52  Resp:  17 20 (!) 24   Temp:      TempSrc:      SpO2:  97% 99% 97%  Weight:      Height:         PHYSICAL EXAM General:  Alert, well-nourished, well-developed pleasant elderly Caucasian lady in no acute distress Psych:  Mood and affect appropriate for situation CV: Regular rate and rhythm on monitor Respiratory:  Regular, unlabored respirations on room air GI: Abdomen soft and nontender   NEURO:  Mental Status: AA&Ox2 Speech/Language: speech is with dysarthria but no aphasia but slight word hesitancy and nonfluent speech  Cranial Nerves:  II: PERRL.   III, IV, VI: EOMI. Eyelids elevate symmetrically.  V: Sensation is intact to light touch and symmetrical to face.  VII: Right facial droop VIII: hearing intact to voice. IX, X: Voice is mildly dysarthric XII: tongue is midline without fasciculations. Motor: 5/5 strength to left upper and lower extremities, 2-3 out of 5 strength to right upper extremity, 4 out of 5 strength to right lower extremity Tone: is normal and bulk is normal Sensation- Intact to light touch bilaterally.   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 ASPECTS 10.  CTA head & neck with CTP emergent LVO at left M1 segment with 146 cc of penumbra and no core infarct, atherosclerosis with flow  reducing stenosis of right vertebral origin, 2 mm left superior cerebellar aneurysm MRI acute left MCA territory infarct involving insula left temporal lobe and corona radiata with small petechial hemorrhage in anterior left temporal lobe, additional punctate embolic infarcts in right parietal lobe 2D Echo EF 45 to 50%, left atrium mildly dilated, right atrium mildly dilated, no atrial level shunt LDL 87 HgbA1c 5.6 VTE prophylaxis -SCDs aspirin 81 mg daily prior to admission, now will begin anticoagulation with heparin Therapy recommendations:  CIR Disposition: Pending  Atrial fibrillation Patient was found to have new onset atrial fibrillation today Continue telemetry monitoring Continue anticoagulation with heparin Cardiology consult for new onset A-fib  Hypertension Home meds: Clonidine 0.1 mg twice daily, lisinopril 40 mg daily, losartan 100 mg daily, metoprolol 100 mg every 24 hours Stable Blood Pressure Goal: SBP 120-160 for first 24 hours then less than 180   Hyperlipidemia Home meds: Atorvastatin 40 mg daily, increased to 80 LDL 87, goal < 70 Continue statin at discharge  Dysphagia Patient has post-stroke dysphagia, SLP consulted    Diet   DIET - DYS 1 Fluid consistency: Nectar Thick   Advance diet as tolerated  Other Stroke Risk Factors Coronary artery disease   Other Active Problems None  Hospital day # 2  Patient seen by NP and then by MD, MD to edit note as needed. Cortney E Ernestina [name] , MSN, AGACNP-BC [facility] See Amion for schedule and pager information 04/06/2023 12:20 PM   STROKE MD NOTE :  I have personally obtained history,examined this patient, reviewed notes, independently viewed imaging studies, participated in medical decision making and plan of care.ROS completed by me personally and pertinent positives fully documented  I have made any additions or clarifications directly to the above note. Agree with note above.  Patient presented with aphasia right hemiparesis due to left M1 occlusion and underwent successful mechanical thrombectomy with excellent revascularization after 2 passes.  She is doing well postextubation and has only mild nonfluent speech and right hemiparesis.  Recommend close neurological observation and strict blood pressure control as per post intervention protocol.  Mobilize out of bed.  Therapy consults.  Start IV heparin drip stroke protocol and will switch to Eliquis in a few days.  Continue ongoing stroke workup.  Aggressive risk factor modification.   Long discussion at the bedside with the patient's husband and daughter and answered questions.  Discussed with Dr. Sherlon Handing neurointerventional radiologist. This patient is critically ill and at significant risk of neurological worsening, death and care requires constant monitoring of vital signs, hemodynamics,respiratory and cardiac monitoring, extensive review of multiple databases, frequent neurological assessment, discussion with family, other specialists and medical decision making of high complexity.I have made any additions or clarifications directly to the above note.This critical care time does not reflect procedure time, or teaching time or supervisory time of PA/NP/Med Resident etc but could involve care discussion time.  I spent 30 minutes of neurocritical care time  in the care of  this patient.      [name], MD Medical Director [facility] Pager: [phone] 04/06/2023 4:23 PM   To contact Stroke Continuity provider, please refer to [email or web address]. After hours, contact General Neurology

## 2023-04-07 DIAGNOSIS — I6602 Occlusion and stenosis of left middle cerebral artery: Secondary | ICD-10-CM | POA: Diagnosis not present

## 2023-04-07 DIAGNOSIS — I4819 Other persistent atrial fibrillation: Secondary | ICD-10-CM | POA: Diagnosis not present

## 2023-04-07 DIAGNOSIS — I6300 Cerebral infarction due to thrombosis of unspecified precerebral artery: Secondary | ICD-10-CM | POA: Diagnosis not present

## 2023-04-07 LAB — CBC
HCT: 34 % — ABNORMAL LOW (ref 36.0–46.0)
Hemoglobin: 11.4 g/dL — ABNORMAL LOW (ref 12.0–15.0)
MCH: 29.2 pg (ref 26.0–34.0)
MCHC: 33.5 g/dL (ref 30.0–36.0)
MCV: 87 fL (ref 80.0–100.0)
Platelets: 120 10*3/uL — ABNORMAL LOW (ref 150–400)
RBC: 3.91 MIL/uL (ref 3.87–5.11)
RDW: 12.7 % (ref 11.5–15.5)
WBC: 5.7 10*3/uL (ref 4.0–10.5)
nRBC: 0 % (ref 0.0–0.2)

## 2023-04-07 LAB — HEPARIN LEVEL (UNFRACTIONATED)
Heparin Unfractionated: 0.27 [IU]/mL — ABNORMAL LOW (ref 0.30–0.70)
Heparin Unfractionated: 0.38 [IU]/mL (ref 0.30–0.70)
Heparin Unfractionated: 0.55 [IU]/mL (ref 0.30–0.70)

## 2023-04-07 LAB — TROPONIN I (HIGH SENSITIVITY): Troponin I (High Sensitivity): 35 ng/L — ABNORMAL HIGH (ref <18)

## 2023-04-07 NOTE — Consult Note (Signed)
Physical Medicine and Rehabilitation Consult Reason for Consult: CVA Referring Physician: Stroke MD   HPI: [name] is a 82 y.o. female who presented ton 04/04/23 with acute right sided weakness, right sided facial droop, and aphasia. CT angiogram revealed a left M1 occlusion, and patient is s/p successful thrombectomy on 10/20. MRI showed an acute left sided MCA infarct involving the insula of the left temporal lobe and corona radiata with small petechial hemorrhage in the anterior left temporal lobe, additional punctate embolic infarcts in the right parietal lobe. PMH is significant for HTN, LBBB, and CAD. Physical Medicine & Rehabilitation was consulted to assess candidacy for CIR.     ROS denies pain Past Medical History:  Diagnosis Date   Chest pain    Coronary artery disease    Hypertension    LBBB (left bundle branch block)    Past Surgical History:  Procedure Laterality Date   ABDOMINAL HYSTERECTOMY     CARDIAC CATHETERIZATION N/A 03/23/2016   Procedure: Left Heart Cath and Coronary Angiography;  Surgeon: Lennette Bihari, MD;  Location: [facility] INVASIVE CV LAB;  Service: Cardiovascular;  Laterality: N/A;   CHOLECYSTECTOMY     RADIOLOGY WITH ANESTHESIA N/A 04/04/2023   Procedure: IR WITH ANESTHESIA;  Surgeon: Radiologist, Medication, MD;  Location: [facility] OR;  Service: Radiology;  Laterality: N/A;   VARICOSE VEIN SURGERY     Family History  Problem Relation Age of Onset   Cancer Mother    Heart attack Father    Social History:  reports that she has never smoked. She has never used smokeless tobacco. She reports that she does not drink alcohol and does not use drugs. Allergies:  Allergies  Allergen Reactions   Codeine Nausea And Vomiting   Penicillins Nausea And Vomiting   Medications Prior to Admission  Medication Sig Dispense Refill   acetaminophen (TYLENOL) 500 MG tablet Take 500 mg by mouth every 6 (six) hours as needed for mild pain.     aspirin EC 81 MG  tablet Take 1 tablet (81 mg total) by mouth daily. 90 tablet 3   atorvastatin (LIPITOR) 40 MG tablet Take 1 tablet (40 mg total) by mouth daily. 30 tablet 12   Cholecalciferol (D-3-5) 5000 units capsule Take 5,000 Units by mouth daily.     cloNIDine (CATAPRES) 0.1 MG tablet Take 1 tablet (0.1 mg total) by mouth 2 (two) times daily. 180 tablet 3   furosemide (LASIX) 40 MG tablet Take 40 mg by mouth daily.     HYDROcodone-acetaminophen (NORCO/VICODIN) 5-325 MG tablet Take 1 tablet by mouth every 6 (six) hours as needed for moderate pain.     lisinopril (ZESTRIL) 40 MG tablet Take 1 tablet by mouth once daily for 90 days     losartan (COZAAR) 100 MG tablet Take 100 mg by mouth daily.     metoprolol succinate (TOPROL-XL) 100 MG 24 hr tablet Take 100 mg by mouth daily. Take with or immediately following a meal.      Home: Home Living Family/patient expects to be discharged to:: Private residence Living Arrangements: Children Available Help at Discharge: Family Type of Home: House Home Access: Stairs to enter Secretary/administrator of Steps: 3 Entrance Stairs-Rails: None Home Layout: One level Bathroom Shower/Tub: Engineer, manufacturing systems: Standard Bathroom Accessibility: Yes Home Equipment: None Additional Comments: wears glasses for reading  Lives With: Son  Functional History: Prior Function Prior Level of Function : Independent/Modified Independent, Driving Mobility Comments: no use of AD  ADLs Comments: does all the home making and cooking. waas taking care of her neice during the week Functional Status:  Mobility: Bed Mobility Overal bed mobility: Needs Assistance Bed Mobility: Supine to Sit Supine to sit: Used rails, HOB elevated, Min assist, Mod assist General bed mobility comments: pt initiated moving towards long sitting to move upright in bed, able to initiate bring Lt LE off EOB, min assist to adduct Rt LE to edge. Cues for use of bed rail and min-mod assist to scoot  anteriorly at EOB to palce feet on floor. Transfers Overall transfer level: Needs assistance Equipment used: 2 person hand held assist Transfers: Sit to/from Stand, Bed to chair/wheelchair/BSC Sit to Stand: +2 physical assistance, +2 safety/equipment, Mod assist Bed to/from chair/wheelchair/BSC transfer type:: Step pivot Step pivot transfers: Mod assist, +2 physical assistance, +2 safety/equipment General transfer comment: 2HHA with manual blocking of Rt LE in standing to facilitate knee extension. Mod A to shift Rt<>Lt for stepping Lt Bed>chair>BSC and for return to chair from [facility].      ADL: ADL Overall ADL's : Needs assistance/impaired Eating/Feeding: Minimal assistance Eating/Feeding Details (indicate cue type and reason): needs cues for R side pocketing of liquids during session. drinking from cup with straw. son present and educated on use of suction if needed Grooming: Wash/dry face, Minimal assistance Grooming Details (indicate cue type and reason): using L UE Upper Body Bathing: Moderate assistance Lower Body Bathing: Maximal assistance Upper Body Dressing : Moderate assistance Lower Body Dressing: Maximal assistance Toilet Transfer: +2 for physical assistance, Moderate assistance, Stand-pivot, +2 for safety/equipment, BSC/3in1 Toileting- Clothing Manipulation and Hygiene: Maximal assistance, Sit to/from stand General ADL Comments: progressed from bed to chair to bsc to chair  Cognition: Cognition Overall Cognitive Status: Impaired/Different from baseline Arousal/Alertness: Awake/alert Orientation Level: Oriented to person, Oriented to place, Disoriented to time, Disoriented to situation Attention: Focused, Sustained Focused Attention: Appears intact Sustained Attention: Impaired Sustained Attention Impairment: Verbal basic Memory: Impaired Memory Impairment: Decreased recall of new information, Storage deficit, Retrieval deficit Awareness: Impaired Awareness  Impairment: Intellectual impairment, Emergent impairment Problem Solving: Impaired Problem Solving Impairment: Verbal basic, Functional basic Executive Function: Reasoning, Sequencing, Self Monitoring, Self Correcting Behaviors: Perseveration Cognition Arousal: Alert Behavior During Therapy: Flat affect Overall Cognitive Status: Impaired/Different from baseline Area of Impairment: Orientation, Attention, Memory, Following commands, Safety/judgement, Awareness, Problem solving Orientation Level: Disoriented to, Place, Time, Situation Current Attention Level: Focused Memory: Decreased recall of precautions Following Commands: Follows one step commands consistently, Follows one step commands with increased time, Follows multi-step commands inconsistently Safety/Judgement: Decreased awareness of safety, Decreased awareness of deficits Awareness: Intellectual Problem Solving: Slow processing General Comments: pt reports that she is 82 years old, pt reports taht she is at a different town initially. pt only able to state the month of birth "april" pt correctly says Son when asked whose Fayrene Fearing, grandson when asked whose Apolinar Junes. pt shows awareness to family. pt able to recall city [place] after sitting up ~5 minutes  Blood pressure (!) 154/66, pulse (!) 52, temperature 98.6 F (37 C), temperature source Oral, resp. rate 20, height 5\' 5"  (1.651 m), weight 64 kg, SpO2 99%. Physical Exam Gen: no distress, normal appearing HEENT: oral mucosa pink and moist, NCAT Cardio: Bradycardia Chest: normal effort, normal rate of breathing Abd: soft, non-distended Ext: no edema Psych: pleasant, normal affect Skin: intact Neuro: Alert, poor insight into condition, dysarthria, 3/5 strength in RUE and 4/5 in RLE. Sensation intact.  Results for orders placed or performed during the hospital  encounter of 04/04/23 (from the past 24 hour(s))  CBC     Status: Abnormal   Collection Time: 04/06/23 10:21 AM   Result Value Ref Range   WBC 8.6 4.0 - 10.5 K/uL   RBC 4.29 3.87 - 5.11 MIL/uL   Hemoglobin 12.2 12.0 - 15.0 g/dL   HCT 13.2 44.0 - 10.2 %   MCV 86.5 80.0 - 100.0 fL   MCH 28.4 26.0 - 34.0 pg   MCHC 32.9 30.0 - 36.0 g/dL   RDW 72.5 36.6 - 44.0 %   Platelets 134 (L) 150 - 400 K/uL   nRBC 0.0 0.0 - 0.2 %  Glucose, capillary     Status: Abnormal   Collection Time: 04/06/23 12:08 PM  Result Value Ref Range   Glucose-Capillary 108 (H) 70 - 99 mg/dL  Heparin level (unfractionated)     Status: Abnormal   Collection Time: 04/06/23  3:00 PM  Result Value Ref Range   Heparin Unfractionated 0.18 (L) 0.30 - 0.70 IU/mL  Heparin level (unfractionated)     Status: Abnormal   Collection Time: 04/06/23 11:35 PM  Result Value Ref Range   Heparin Unfractionated 0.27 (L) 0.30 - 0.70 IU/mL  CBC     Status: Abnormal   Collection Time: 04/07/23  4:41 AM  Result Value Ref Range   WBC 5.7 4.0 - 10.5 K/uL   RBC 3.91 3.87 - 5.11 MIL/uL   Hemoglobin 11.4 (L) 12.0 - 15.0 g/dL   HCT 34.7 (L) 42.5 - 95.6 %   MCV 87.0 80.0 - 100.0 fL   MCH 29.2 26.0 - 34.0 pg   MCHC 33.5 30.0 - 36.0 g/dL   RDW 38.7 56.4 - 33.2 %   Platelets 120 (L) 150 - 400 K/uL   nRBC 0.0 0.0 - 0.2 %  Heparin level (unfractionated)     Status: None   Collection Time: 04/07/23  8:39 AM  Result Value Ref Range   Heparin Unfractionated 0.55 0.30 - 0.70 IU/mL   MR BRAIN WO CONTRAST  Result Date: 04/05/2023 CLINICAL DATA:  Stroke, follow up EXAM: MRI HEAD WITHOUT CONTRAST TECHNIQUE: Multiplanar, multiecho pulse sequences of the brain and surrounding structures were obtained without intravenous contrast. COMPARISON:  CT head 04/04/2023 FINDINGS: Brain: Acute left MCA territory infarct involving the insula, left temporal lobe in the corona radiata. There is a additional punctate acute infarcts in the right parietal lobe. There is susceptibility artifact in the anterior left temporal lobe, favored to represent a small petechial hemorrhage.  No mass effect. No mass lesion. No hydrocephalus. No extra-axial fluid collection Vascular: Normal flow voids. Skull and upper cervical spine: Normal marrow signal. Sinuses/Orbits: No middle ear or mastoid effusion. Paranasal sinuses are clear Other: None. IMPRESSION: 1. Acute left MCA territory infarct involving the insula, left temporal lobe and corona radiata. Susceptibility artifact in the anterior left temporal lobe, favored to represent a small petechial hemorrhage. No mass effect. 2. Additional punctate embolic acute infarcts in the right parietal lobe. These results will be called to the ordering clinician or representative by the Radiologist Assistant, and communication documented in the PACS or [employer]. Electronically Signed   By: Lorenza Cambridge M.D.   On: 04/05/2023 12:06    Assessment/Plan: Diagnosis: CVA Does the need for close, 24 hr/day medical supervision in concert with the patient's rehab needs make it unreasonable for this patient to be served in a less intensive setting? Yes Co-Morbidities requiring supervision/potential complications:  1) HTN: monitor blood pressure TID 2) Bradycardia:  monitor HR TID 3) Tachpnea: monitor RR TID 4) CAD 5) Afib: continue heparin Due to bladder management, bowel management, safety, skin/wound care, disease management, medication administration, pain management, and patient education, does the patient require 24 hr/day rehab nursing? Yes Does the patient require coordinated care of a physician, rehab nurse, therapy disciplines of PT, OT, SLP to address physical and functional deficits in the context of the above medical diagnosis(es)? Yes Addressing deficits in the following areas: balance, endurance, locomotion, strength, transferring, bowel/bladder control, bathing, dressing, feeding, grooming, toileting, cognition, speech, language, swallowing, and psychosocial support Can the patient actively participate in an intensive therapy program of  at least 3 hrs of therapy per day at least 5 days per week? Yes The potential for patient to make measurable gains while on inpatient rehab is excellent Anticipated functional outcomes upon discharge from inpatient rehab are supervision  with PT, supervision with OT, supervision with SLP. Estimated rehab length of stay to reach the above functional goals is: 12-16 days Anticipated discharge destination: Home Overall Rehab/Functional Prognosis: excellent  POST ACUTE RECOMMENDATIONS: This patient's condition is appropriate for continued rehabilitative care in the following setting: CIR Patient has agreed to participate in recommended program. Potentially Note that insurance prior authorization may be required for reimbursement for recommended care.   I have personally performed a face to face diagnostic evaluation of this patient. Additionally, I have examined the patient's medical record including any pertinent labs and radiographic images. If the physician assistant has documented in this note, I have reviewed and edited or otherwise concur with the physician assistant's documentation.  Thanks,  Horton Chin, MD 04/07/2023

## 2023-04-07 NOTE — TOC CM/SW Note (Signed)
Transition of Care [facility]) - Inpatient Brief Assessment   Patient Details  Name: [name] MRN: [id number] Date of Birth: [birth year]  [facility] CM/SW Contact:    Kermit Balo, RN Phone Number: 04/07/2023, 2:52 PM   Clinical Narrative:  CIR starting insurance for rehab admission  Transition of Care Asessment: Insurance and Status: Insurance coverage has been reviewed Patient has primary care physician: Yes Home environment has been reviewed: from home   Prior/Current Home Services: No current home services Social Determinants of Health Reivew: SDOH reviewed no interventions necessary Readmission risk has been reviewed: Yes Transition of care needs: transition of care needs identified, TOC will continue to follow

## 2023-04-07 NOTE — Plan of Care (Signed)
  Problem: Education: Goal: Knowledge of disease or condition will improve Outcome: Progressing Goal: Knowledge of secondary prevention will improve (MUST DOCUMENT ALL) Outcome: Progressing Goal: Knowledge of patient specific risk factors will improve Loraine Leriche N/A or DELETE if not current risk factor) Outcome: Progressing   Problem: Ischemic Stroke/TIA Tissue Perfusion: Goal: Complications of ischemic stroke/TIA will be minimized Outcome: Progressing   Problem: Coping: Goal: Will verbalize positive feelings about self Outcome: Progressing Goal: Will identify appropriate support needs Outcome: Progressing   Problem: Health Behavior/Discharge Planning: Goal: Ability to manage health-related needs will improve Outcome: Progressing Goal: Goals will be collaboratively established with patient/family Outcome: Progressing   Problem: Self-Care: Goal: Ability to participate in self-care as condition permits will improve Outcome: Progressing Goal: Verbalization of feelings and concerns over difficulty with self-care will improve Outcome: Progressing Goal: Ability to communicate needs accurately will improve Outcome: Progressing   Problem: Nutrition: Goal: Risk of aspiration will decrease Outcome: Progressing Goal: Dietary intake will improve Outcome: Progressing   Problem: Activity: Goal: Ability to return to baseline activity level will improve Outcome: Progressing   Problem: Cardiovascular: Goal: Ability to achieve and maintain adequate cardiovascular perfusion will improve Outcome: Progressing Goal: Vascular access site(s) Level 0-1 will be maintained Outcome: Progressing   Problem: Health Behavior/Discharge Planning: Goal: Ability to safely manage health-related needs after discharge will improve Outcome: Progressing   Problem: Education: Goal: Knowledge of General Education information will improve Description: Including pain rating scale, medication(s)/side effects and  non-pharmacologic comfort measures Outcome: Progressing   Problem: Health Behavior/Discharge Planning: Goal: Ability to manage health-related needs will improve Outcome: Progressing   Problem: Clinical Measurements: Goal: Ability to maintain clinical measurements within normal limits will improve Outcome: Progressing Goal: Will remain free from infection Outcome: Progressing Goal: Diagnostic test results will improve Outcome: Progressing Goal: Respiratory complications will improve Outcome: Progressing Goal: Cardiovascular complication will be avoided Outcome: Progressing   Problem: Activity: Goal: Risk for activity intolerance will decrease Outcome: Progressing   Problem: Nutrition: Goal: Adequate nutrition will be maintained Outcome: Progressing   Problem: Coping: Goal: Level of anxiety will decrease Outcome: Progressing   Problem: Elimination: Goal: Will not experience complications related to bowel motility Outcome: Progressing Goal: Will not experience complications related to urinary retention Outcome: Progressing   Problem: Pain Managment: Goal: General experience of comfort will improve Outcome: Progressing   Problem: Safety: Goal: Ability to remain free from injury will improve Outcome: Progressing   Problem: Skin Integrity: Goal: Risk for impaired skin integrity will decrease Outcome: Progressing

## 2023-04-07 NOTE — Progress Notes (Signed)
STROKE TEAM PROGRESS NOTE   BRIEF HPI [name] is a 82 y.o. female with history of CAD, hypertension and left bundle branch block presenting with acute onset right-sided weakness, right facial droop and aphasia.   CT angiogram revealed left M1 occlusion, and patient was taken to interventional radiology for mechanical thrombectomy.  This was successful, with TICI 3 revascularization was achieved after 2 passes.  She was extubated postprocedure and appears to have improved but remains disoriented with some right-sided weakness.   SIGNIFICANT HOSPITAL EVENTS 10/20: Patient presented to hospital, mechanical thrombectomy performed 10/22: Patient transferred out of the ICU  INTERIM HISTORY/SUBJECTIVE Patient has been hemodynamically stable overnight with Tmax of 98.6  She continues to have some confusion and right-sided weakness, although it is improved today.  She is stable and r and has been transferred out of the ICU yesterday.  Therapist recommend inpatient rehab she remains on IV heparin drip.   OBJECTIVE Today's Vitals   04/07/23 0751 04/07/23 0820 04/07/23 1139 04/07/23 1547  BP: (!) 154/66  (!) 161/70 (!) 166/71  Pulse:    81  Resp: 20  17 20   Temp: 98.6 F (37 C)  98.1 F (36.7 C) 98.4 F (36.9 C)  TempSrc: Oral  Oral Oral  SpO2: 99%  99% 100%  Weight:      Height:      PainSc:  0-No pain     Body mass index is 23.48 kg/m.  CBC    Component Value Date/Time   WBC 5.7 04/07/2023 0441   RBC 3.91 04/07/2023 0441   HGB 11.4 (L) 04/07/2023 0441   HCT 34.0 (L) 04/07/2023 0441   PLT 120 (L) 04/07/2023 0441   MCV 87.0 04/07/2023 0441   MCH 29.2 04/07/2023 0441   MCHC 33.5 04/07/2023 0441   RDW 12.7 04/07/2023 0441   LYMPHSABS 1.2 04/05/2023 0755   MONOABS 0.4 04/05/2023 0755   EOSABS 0.0 04/05/2023 0755   BASOSABS 0.0 04/05/2023 0755    BMET    Component Value Date/Time   NA 140 04/06/2023 0721   K 3.9 04/06/2023 0721   CL 103 04/06/2023 0721   CO2 22  04/06/2023 0721   GLUCOSE 108 (H) 04/06/2023 0721   BUN 9 04/06/2023 0721   CREATININE 0.76 04/06/2023 0721   CREATININE 1.02 (H) 03/20/2016 1630   CALCIUM 10.0 04/06/2023 0721   GFRNONAA >60 04/06/2023 0721    IMAGING past 24 hours No results found.  Vitals:   04/07/23 0359 04/07/23 0751 04/07/23 1139 04/07/23 1547  BP: (!) 153/77 (!) 154/66 (!) 161/70 (!) 166/71  Pulse: (!) 52   81  Resp: 16 20 17 20   Temp: 98.1 F (36.7 C) 98.6 F (37 C) 98.1 F (36.7 C) 98.4 F (36.9 C)  TempSrc: Oral Oral Oral Oral  SpO2: 100% 99% 99% 100%  Weight:      Height:         PHYSICAL EXAM General:  Alert, well-nourished, well-developed pleasant elderly Caucasian lady in no acute distress Psych:  Mood and affect appropriate for situation CV: Regular rate and rhythm on monitor Respiratory:  Regular, unlabored respirations on room air GI: Abdomen soft and nontender   NEURO:  Mental Status: AA&Ox2 Speech/Language: speech is with dysarthria but no aphasia but slight word hesitancy and nonfluent speech  Cranial Nerves:  II: PERRL.   III, IV, VI: EOMI. Eyelids elevate symmetrically.  V: Sensation is intact to light touch and symmetrical to face.  VII: Right facial droop  VIII: hearing intact to voice. IX, X: Voice is mildly dysarthric XII: tongue is midline without fasciculations. Motor: 5/5 strength to left upper and lower extremities, 2-3 out of 5 strength to right upper extremity, 4 out of 5 strength to right lower extremity Tone: is normal and bulk is normal Sensation- Intact to light touch bilaterally.   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 ASPECTS 10.  CTA head & neck with CTP emergent LVO at left M1 segment with 146 cc of penumbra and no core infarct, atherosclerosis with flow reducing stenosis of right vertebral origin, 2 mm left  superior cerebellar aneurysm MRI acute left MCA territory infarct involving insula left temporal lobe and corona radiata with small petechial hemorrhage in anterior left temporal lobe, additional punctate embolic infarcts in right parietal lobe 2D Echo EF 45 to 50%, left atrium mildly dilated, right atrium mildly dilated, no atrial level shunt LDL 87 HgbA1c 5.6 VTE prophylaxis -SCDs aspirin 81 mg daily prior to admission, now will begin anticoagulation with heparin and transition to Eliquis in a few days Therapy recommendations: CIR Disposition: Pending  Atrial fibrillation Patient was found to have new onset atrial fibrillation today Continue telemetry monitoring Continue anticoagulation with heparin Cardiology consult for new onset A-fib  Hypertension Home meds: Clonidine 0.1 mg twice daily, lisinopril 40 mg daily, losartan 100 mg daily, metoprolol 100 mg every 24 hours Stable Blood Pressure Goal: SBP 120-160 for first 24 hours then less than 180   Hyperlipidemia Home meds: Atorvastatin 40 mg daily, increased to 80 LDL 87, goal < 70 Continue statin at discharge  Dysphagia Patient has post-stroke dysphagia, SLP consulted    Diet   DIET - DYS 1 Fluid consistency: Nectar Thick   Advance diet as tolerated  Other Stroke Risk Factors Coronary artery disease   Other Active Problems None  Hospital day # 3  Patient presented with aphasia right hemiparesis due to left M1 occlusion and underwent successful mechanical thrombectomy with excellent revascularization after 2 passes.  She is doing well   and has only mild nonfluent speech and right hemiparesis.  Continue mobilization out of bed and therapy consults.  Continue IV heparin drip stroke protocol and will switch to Eliquis in a few days.  Continue  Aggressive risk factor modification.  Long discussion at the bedside with the patient's husband and daughter and answered questions.   Hopefully transfer to inpatient rehab in a few  days when bed available.      Brianna Heady, MD Medical Director [facility] Pager: [phone] 04/07/2023 3:56 PM   To contact Stroke Continuity provider, please refer to [email or web address]. After hours, contact General Neurology

## 2023-04-07 NOTE — Progress Notes (Signed)
Physical Therapy Treatment Patient Details Name: [name] MRN: [id number] DOB: [birth year] Today's Date: 04/07/2023   History of Present Illness 82 y.o. female who presented 04/04/23 with acute onset right-sided weakness, right facial droop and aphasia.  CT angiogram=  left M1 occlusion  s/p thrombectomy on 10/20. 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 PMH significant for HTN, LBBB, CAD.    PT Comments  Pt is progressing towards goals. Pt was able to perform supine to sitting at CGA, sit to stand at Min A and short distance gait at Min A with RW. Pt tends to run into objects on the R and requires verbal cueing to scan room to prevent objects. Pt has significant decrease in safety awareness and requires Max cues for safety throughout session to prevent risk for falls including maintaining AD with functional activity. Due to pt current functional status, good family support at home, home set up and available assistance recommending skilled physical therapy services > 3 hours/day on discharge from acute care hospital setting in order to decrease risk for falls, injury and re-hospitalization. Pt tolerated treatment session well.     If plan is discharge home, recommend the following: Assistance with cooking/housework;Assist for transportation;Help with stairs or ramp for entrance;Supervision due to cognitive status;A little help with walking and/or transfers     Equipment Recommendations  Other (comment) (defer to post acute)       Precautions / Restrictions Precautions Precautions: Fall Restrictions Weight Bearing Restrictions: No     Mobility  Bed Mobility Overal bed mobility: Needs Assistance Bed Mobility: Supine to Sit     Supine to sit: Min assist     General bed mobility comments: Min A at trunk and  verbal/tactile cues to use the RUE    Transfers Overall transfer level: Needs assistance Equipment used: Rolling walker (2 wheels) Transfers: Sit to/from Stand, Bed to chair/wheelchair/BSC Sit to Stand: Min assist   Step pivot transfers: Min assist       General transfer comment: Min A with Max A to place RUE on the RW. Min A for R sided obstacles pt runs AD into obstacles and requires verbal cues to scan for object    Ambulation/Gait Ambulation/Gait assistance: Min assist Gait Distance (Feet): 15 Feet Assistive device: Rolling walker (2 wheels) Gait Pattern/deviations: Step-through pattern, Decreased step length - left Gait velocity: decreased Gait velocity interpretation: <1.31 ft/sec, indicative of household ambulator   General Gait Details: Very short partial step through step pattern on the L due to poor strength on the R. Pt requires Min A to prevent R sided LOB. Pt requires AD.     Modified Rankin (Stroke Patients Only) Modified Rankin (Stroke Patients Only) Pre-Morbid Rankin Score: No symptoms Modified Rankin: Moderate disability     Balance Overall balance assessment: Needs assistance Sitting-balance support: Single extremity supported, Feet supported Sitting balance-Leahy Scale: Fair   Postural control: Right lateral lean Standing balance support: Bilateral upper extremity supported, During functional activity Standing balance-Leahy Scale: Poor Standing balance comment: R LOB poor safety awareness        Cognition Arousal: Alert Behavior During Therapy: Flat affect Overall Cognitive Status: Impaired/Different from baseline       Orientation Level: Situation, Time     Following Commands: Follows one step commands consistently Safety/Judgement: Decreased awareness of safety, Decreased awareness of deficits  General Comments General comments (skin integrity, edema, etc.): pt tolerated session well. Denies dizziness with activity       Pertinent Vitals/Pain Pain Assessment Pain Assessment: No/denies pain     PT Goals (current goals can now be found in the care plan section) Acute Rehab PT Goals Patient Stated Goal: recover mobility/independence PT Goal Formulation: With patient Time For Goal Achievement: 04/20/23 Potential to Achieve Goals: Good Progress towards PT goals: Progressing toward goals    Frequency    Min 1X/week      PT Plan  Continue with current POC       AM-PAC PT "6 Clicks" Mobility   Outcome Measure  Help needed turning from your back to your side while in a flat bed without using bedrails?: A Little Help needed moving from lying on your back to sitting on the side of a flat bed without using bedrails?: A Little Help needed moving to and from a bed to a chair (including a wheelchair)?: A Little Help needed standing up from a chair using your arms (e.g., wheelchair or bedside chair)?: A Little Help needed to walk in hospital room?: A Lot Help needed climbing 3-5 steps with a railing? : Total 6 Click Score: 15    End of Session Equipment Utilized During Treatment: Gait belt Activity Tolerance: Patient tolerated treatment well Patient left: in chair;with call bell/phone within reach;with chair alarm set Nurse Communication: Mobility status PT Visit Diagnosis: Unsteadiness on feet (R26.81);Other abnormalities of gait and mobility (R26.89);Muscle weakness (generalized) (M62.81);Difficulty in walking, not elsewhere classified (R26.2);Other symptoms and signs involving the nervous system (R29.898);Hemiplegia and hemiparesis Hemiplegia - Right/Left: Right Hemiplegia - dominant/non-dominant: Dominant Hemiplegia - caused by: Cerebral infarction     Time: 1207-1220 PT Time Calculation (min) (ACUTE ONLY): 13 min  Charges:    $Gait Training: 8-22 mins PT General Charges $$ ACUTE PT VISIT: 1 Visit                     Harrel Carina, DPT, CLT  Acute Rehabilitation Services Office:  [phone] (Secure chat preferred)    Claudia Desanctis 04/07/2023, 12:27 PM

## 2023-04-07 NOTE — Progress Notes (Signed)
PHARMACY - ANTICOAGULATION CONSULT NOTE  Pharmacy Consult for heparin Indication: atrial fibrillation and stroke  Allergies  Allergen Reactions   Codeine Nausea And Vomiting   Penicillins Nausea And Vomiting    Patient Measurements: Height: 5\' 5"  (165.1 cm) Weight: 64 kg (141 lb 1.5 oz) IBW/kg (Calculated) : 57 Heparin Dosing Weight: 64  Vital Signs: Temp: 98.4 F (36.9 C) (10/23 1547) Temp Source: Oral (10/23 1547) BP: 160/69 (10/23 1644) Pulse Rate: 81 (10/23 1547)  Labs: Recent Labs    04/05/23 0755 04/05/23 2102 04/06/23 0721 04/06/23 1021 04/06/23 1500 04/06/23 2335 04/07/23 0441 04/07/23 0839 04/07/23 1639 04/07/23 1819  HGB 11.4*  --   --  12.2  --   --  11.4*  --   --   --   HCT 34.8*  --   --  37.1  --   --  34.0*  --   --   --   PLT 130*  --   --  134*  --   --  120*  --   --   --   HEPARINUNFRC  --    < > 0.33  --    < > 0.27*  --  0.55  --  0.38  CREATININE 0.79  --  0.76  --   --   --   --   --   --   --   TROPONINIHS  --   --   --   --   --   --   --   --  35*  --    < > = values in this interval not displayed.    Estimated Creatinine Clearance: 48.8 mL/min (by C-G formula based on SCr of 0.76 mg/dL).   Assessment: 82yoF who admitted on 10/20 as code stroke, underwent revascularization w/ NIR on 10/20 1419 given presentation with LVO of M1. Patient with new aflutter, requiring anticoagulation given cardioembolic etiology. CBC relatively stable from admission.  Heparin level therapeutic post rate reduction this AM.  No bleeding reported.  Goal of Therapy:  Heparin level 0.3-0.5 units/ml Monitor platelets by anticoagulation protocol: Yes   Plan:  Continue heparin infusion at 1100 units/hr F/U AM labs F/U transition to DOAC per Neuro   Tiffani Kadow D. Laney Potash, PharmD, BCPS, BCCCP 04/07/2023, 7:25 PM

## 2023-04-07 NOTE — Care Management Important Message (Signed)
Important Message  Patient Details  Name: [name] MRN: [id number] Date of Birth: July 27, 1940   Important Message Given:  Yes - Medicare IM     Dorena Bodo 04/07/2023, 1:34 PM

## 2023-04-07 NOTE — PMR Pre-admission (Signed)
PMR Admission Coordinator Pre-Admission Assessment  Patient: [name] is an 82 y.o., female MRN: [id number] DOB: 02-06-1941 Height: 5\' 5"  (165.1 cm) Weight: 64 kgyes              Insurance Information HMO:     PPO:      PCP:      IPA:      80/20:      OTHER:  PRIMARY: [employer] medicare      Policy#: [id number]      Subscriber: pt CM Name: Marilynne Halsted with [facility]      Phone#: [phone] option 7      Fax#: [phone] Pre-Cert#: [id number] approved for 7 days from admit, until 04/18/23      Employer:  Benefits:  Phone #: [phone]     Name: 10/23 Eff. Date: 06/15/22     Deduct: none      Out of Pocket Max: $3600      Life Max: none  CIR: $295 co pay per day days 1 until 5      SNF: no co pay per day days 1 until 20; $203 co pay per day days 21 until 100 Outpatient: $20 per visit     Co-Pay:  Home Health: 100%      Co-Pay: per medical neccesity DME: 80%     Co-Pay: 20% Providers: in network  SECONDARY: none  Financial Counselor:       Phone#:   The Data processing manager" for patients in Inpatient Rehabilitation Facilities with attached "Privacy Act Statement-Health Care Records" was provided and verbally reviewed with: Patient and Family  Emergency Contact Information Contact Information     Name Relation Home Work Mobile   Ambia Son [phone]     Delorise Shiner [phone]        Other Contacts     Name Relation Home Work Mobile   [place] Other   [phone]      Current Medical History  Patient Admitting Diagnosis: CVA  History of Present Illness: 82 year old female with history of chest pain, CAD, HTN, Left bundle branch block, who presented on 04/04/23 with right sided weakness to AP[facility].   Tele stroke Neurology seen and TNK was given. CT angio of head and neck revealed M1 occlusion, Transferred to [facility] on 04/04/23. Taken to IR with successful mechanical thrombectomy with TICI 3 revascularization  after 2 passes. MRI acute left MCA territory infarct involving insula left temporal lobe and corona radiata with small petechial hemorrhage in anterior left temporal lobe, additional punctate embolic infarcts in right parietal lobe . 2 d echo EF 45 to 50%, left atrium midly dilated, right atrium midly dilated, no atrial level shunt. Found to have new onset atrial fibrillation. Cardiology consulted. On heparin gtt. Recommend no beta blocker due to slow ventricular response. No symptoms of heart failure. Continue losartan and would add aldactone if BP remains elevated.  Home meds for HTN of clonidine, lisinopril and losartan, as well as metoprolol. Hom meds atorvastatin with LDL 87. Seen by SLP with dysphagia 1 diet with nectar thick liquids.   Complete NIHSS TOTAL: 11 Glasgow Coma Scale Score: 14  Patient's medical record from [facility] and [facility] has been reviewed by the rehabilitation admission coordinator and physician.  Past Medical History  Past Medical History:  Diagnosis Date   Chest pain    Coronary artery disease    Hypertension    LBBB (left bundle branch block)  Has the patient had major surgery during 100 days prior to admission? Yes  Family History  family history includes Cancer in her mother; Heart attack in her father.  Current Medications   Current Facility-Administered Medications:    acetaminophen (TYLENOL) tablet 650 mg, 650 mg, Oral, Q4H PRN, 650 mg at 04/09/23 1002 **OR** acetaminophen (TYLENOL) 160 MG/5ML solution 650 mg, 650 mg, Per Tube, Q4H PRN **OR** acetaminophen (TYLENOL) suppository 650 mg, 650 mg, Rectal, Q4H PRN, Mathews Argyle, NP   apixaban (ELIQUIS) tablet 5 mg, 5 mg, Oral, BID, Pamalee Leyden, Devon, NP, 5 mg at 04/09/23 0948   atorvastatin (LIPITOR) tablet 80 mg, 80 mg, Oral, Daily, Pearlean Brownie, Pramod S, MD, 80 mg at 04/09/23 1610   hydrALAZINE (APRESOLINE) injection 10 mg, 10 mg, Intravenous, Q4H PRN, de Saintclair Halsted, Cortney E, NP, 10 mg at 04/09/23 1009    losartan (COZAAR) tablet 100 mg, 100 mg, Oral, Daily, de Saintclair Halsted, Sharon E, NP, 100 mg at 04/09/23 9604   Oral care mouth rinse, 15 mL, Mouth Rinse, PRN, Micki Riley, MD   Oral care mouth rinse, 15 mL, Mouth Rinse, 4 times per day, Erick Blinks, MD, 15 mL at 04/09/23 1204   Oral care mouth rinse, 15 mL, Mouth Rinse, PRN, Erick Blinks, MD   senna-docusate (Senokot-S) tablet 1 tablet, 1 tablet, Oral, QHS PRN, Mathews Argyle, NP  Patients Current Diet:  Diet Order             DIET - DYS 1 Fluid consistency: Nectar Thick  Diet effective now                  Precautions / Restrictions Precautions Precautions: Fall Precaution Comments: SBP <180 Restrictions Weight Bearing Restrictions: No   Has the patient had 2 or more falls or a fall with injury in the past year?No  Prior Activity Level Community (5-7x/wk): Independent, driving, caring for niece 72 years old daily  Prior Functional Level Prior Function Prior Level of Function : Independent/Modified Independent, Driving Mobility Comments: no use of AD ADLs Comments: does all the home making and cooking. waas taking care of her neice during the week  Self Care: Did the patient need help bathing, dressing, using the toilet or eating?  Independent  Indoor Mobility: Did the patient need assistance with walking from room to room (with or without device)? Independent  Stairs: Did the patient need assistance with internal or external stairs (with or without device)? Independent  Functional Cognition: Did the patient need help planning regular tasks such as shopping or remembering to take medications? Independent  Patient Information Are you of Hispanic, Latino/a,or Spanish origin?: A. No, not of Hispanic, Latino/a, or Spanish origin What is your race?: A. White Do you need or want an interpreter to communicate with a doctor or health care staff?: 0. No  Patient's Response To:  Health Literacy and  Transportation Is the patient able to respond to health literacy and transportation needs?: Yes Health Literacy - How often do you need to have someone help you when you read instructions, pamphlets, or other written material from your doctor or pharmacy?: Never In the past 12 months, has lack of transportation kept you from medical appointments or from getting medications?: No In the past 12 months, has lack of transportation kept you from meetings, work, or from getting things needed for daily living?: No  Home Assistive Devices / Equipment Home Equipment: None  Prior Device Use: Indicate devices/aids used by the patient prior  to current illness, exacerbation or injury? None of the above  Current Functional Level Cognition  Arousal/Alertness: Awake/alert Overall Cognitive Status: Impaired/Different from baseline Current Attention Level: Sustained Orientation Level: Oriented to person, Disoriented to time, Disoriented to situation, Disoriented to place Following Commands: Follows one step commands consistently Safety/Judgement: Decreased awareness of safety, Decreased awareness of deficits General Comments: repeating same conversation throughout session.  after saying comment would pause then repeat the same comment again.  "my grandson was just here" would then list her grandchildren and then pause and start over Attention: Focused, Sustained Focused Attention: Appears intact Sustained Attention: Impaired Sustained Attention Impairment: Verbal basic Memory: Impaired Memory Impairment: Decreased recall of new information, Storage deficit, Retrieval deficit Awareness: Impaired Awareness Impairment: Intellectual impairment, Emergent impairment Problem Solving: Impaired Problem Solving Impairment: Verbal basic, Functional basic Executive Function: Reasoning, Sequencing, Self Monitoring, Self Correcting Behaviors: Perseveration    Extremity Assessment (includes  Sensation/Coordination)  Upper Extremity Assessment: Right hand dominant, RUE deficits/detail RUE Deficits / Details: activation of the shoulder, activation of shoulder flexion, shoulder abduction, digit flexion. pt lacks digit extension, wrist extension and elbow flexion. OT to watch for splinting needs and hold on splinting at this time as pt was activating R UE some RUE Sensation: decreased light touch, decreased proprioception RUE Coordination: decreased fine motor, decreased gross motor  Lower Extremity Assessment: Defer to PT evaluation RLE Deficits / Details: 3/5 knee ext/flex; 3+/5 Df, 3-/5 PF, 3-/5 hip flex or less RLE Coordination: decreased fine motor, decreased gross motor LLE Deficits / Details: grossly 3+/5 throughout or better LLE Coordination: decreased gross motor    ADLs  Overall ADL's : Needs assistance/impaired Eating/Feeding: Minimal assistance Eating/Feeding Details (indicate cue type and reason): needs cues for R side pocketing of liquids during session. drinking from cup with straw. son present and educated on use of suction if needed Grooming: Wash/dry face, Minimal assistance Grooming Details (indicate cue type and reason): using L UE Upper Body Bathing: Moderate assistance Lower Body Bathing: Maximal assistance Upper Body Dressing : Moderate assistance Lower Body Dressing: Set up, Sitting/lateral leans Lower Body Dressing Details (indicate cue type and reason): able to manage B socks with figure 4 method seated in recliner Toilet Transfer: Minimal assistance, Cueing for sequencing, Stand-pivot, BSC/3in1 Toileting- Clothing Manipulation and Hygiene: Maximal assistance, Sit to/from stand Functional mobility during ADLs: Minimal assistance, Rolling walker (2 wheels) General ADL Comments: ambulated from 3n1, around the bed to recliner    Mobility  Overal bed mobility: Needs Assistance Bed Mobility: Supine to Sit Supine to sit: Contact guard General bed mobility  comments: seated eob with rn assisting upon arrival to room    Transfers  Overall transfer level: Needs assistance Equipment used: Rolling walker (2 wheels) Transfers: Sit to/from Stand, Bed to chair/wheelchair/BSC Sit to Stand: Min assist Bed to/from chair/wheelchair/BSC transfer type:: Step pivot Step pivot transfers: Min assist General transfer comment: Min A with Max A to place RUE on the RW. Assist for balance support as pt powers up to full stand.    Ambulation / Gait / Stairs / Wheelchair Mobility  Ambulation/Gait Ambulation/Gait assistance: Editor, commissioning (Feet): 175 Feet Assistive device: Rolling walker (2 wheels) Gait Pattern/deviations: Step-through pattern, Decreased step length - left, Drifts right/left General Gait Details: Min A for R sided obstacles pt runs AD into obstacles and requires verbal cues to scan for objects on the R. Consistent assist to steer walker as she veers R with it while looking L. Assist to keep R hand on  walker throughout. Gait velocity: Decreased Gait velocity interpretation: <1.31 ft/sec, indicative of household ambulator    Posture / Balance Dynamic Sitting Balance Sitting balance - Comments: CGA at EOB. Balance Overall balance assessment: Needs assistance Sitting-balance support: Single extremity supported, Feet supported Sitting balance-Leahy Scale: Fair Sitting balance - Comments: CGA at EOB. Postural control: Right lateral lean Standing balance support: Bilateral upper extremity supported, During functional activity Standing balance-Leahy Scale: Poor Standing balance comment: R LOB poor safety awareness    Special needs/care consideration    Previous Home Environment  Living Arrangements:  (lives with son, Fayrene Fearing and grandson , Apolinar Junes)  Lives With: Son Available Help at Discharge: Family, Available 24 hours/day Fayrene Fearing will work out 24/7 supervision) Type of Home: [employer] Layout: One level Home Access: Stairs to  enter Entrance Stairs-Rails: None Secretary/administrator of Steps: 3 Bathroom Shower/Tub: Engineer, manufacturing systems: Pharmacist, community: Yes Home Care Services: No Additional Comments: wears glasses for reading  Discharge Living Setting Plans for Discharge Living Setting: Patient's home, Lives with (comment), House (lives with Fayrene Fearing, son and grandson, Apolinar Junes) Type of Home at Discharge: House Discharge Home Layout: One level Discharge Home Access: Stairs to enter Entrance Stairs-Rails: None Entrance Stairs-Number of Steps: 3 Discharge Bathroom Shower/Tub: Tub/shower unit Discharge Bathroom Toilet: Standard Discharge Bathroom Accessibility: Yes How Accessible: Accessible via walker Does the patient have any problems obtaining your medications?: No  Social/Family/Support Systems Patient Roles: Caregiver, Parent Contact Information: son, Fayrene Fearing Anticipated Caregiver: Fayrene Fearing and Apolinar Junes with additional family members Anticipated Caregiver's Contact Information: see contacts Ability/Limitations of Caregiver: family work , but will work out schedule Caregiver Availability: 24/7 Discharge Plan Discussed with Primary Caregiver: Yes Is Caregiver In Agreement with Plan?: Yes Does Caregiver/Family have Issues with Lodging/Transportation while Pt is in Rehab?: No  Marchelle Folks, Jame's exwife will be there during the day and Fayrene Fearing and Apolinar Junes after work to provide [employer] Goals Patient/Family Goal for Rehab: supervision with PT, OT and SLP Expected length of stay: ELOS 10 to 14 days Pt/Family Agrees to Admission and willing to participate: Yes Program Orientation Provided & Reviewed with Pt/Caregiver Including Roles  & Responsibilities: Yes  Decrease burden of Care through IP rehab admission: n/a  Possible need for SNF placement upon discharge:not anticipated  Patient Condition: This patient's medical and functional status has changed since the consult dated: 04/07/23 in  which the Rehabilitation Physician determined and documented that the patient's condition is appropriate for intensive rehabilitative care in an inpatient rehabilitation facility. See "History of Present Illness" (above) for medical update. Functional changes are: Patient is requiring min A for ambulation with RW and min A for ADLs. Patient also has aphasia. Patient's medical and functional status update has been discussed with the Rehabilitation physician and patient remains appropriate for inpatient rehabilitation. Will admit to inpatient rehab today.  Preadmission Screen Completed By:  Clois Dupes, RN, MSN 04/09/2023 3:23 PM ______________________________________________________________________   Discussed status with Dr. Riley Kill on 04/12/23 at 9:30 am and received approval for admission today.  Admission Coordinator:  Clois Dupes, RN MSN, updates provided by Lissa Merlin, PT time 10:00 am Dorna Bloom 04/12/2023

## 2023-04-07 NOTE — Progress Notes (Signed)
PHARMACY - ANTICOAGULATION CONSULT NOTE  Pharmacy Consult for heparin Indication: atrial fibrillation and stroke  Allergies  Allergen Reactions   Codeine Nausea And Vomiting   Penicillins Nausea And Vomiting    Patient Measurements: Height: 5\' 5"  (165.1 cm) Weight: 64 kg (141 lb 1.5 oz) IBW/kg (Calculated) : 57 Heparin Dosing Weight: 64  Vital Signs: Temp: 98.5 F (36.9 C) (10/22 2342) Temp Source: Oral (10/22 2342) BP: 156/67 (10/22 2342) Pulse Rate: 50 (10/22 2342)  Labs: Recent Labs    04/04/23 1005 04/04/23 1018 04/04/23 1710 04/05/23 0755 04/05/23 2102 04/06/23 0721 04/06/23 1021 04/06/23 1500 04/06/23 2335  HGB 13.2 13.6  --  11.4*  --   --  12.2  --   --   HCT 40.0 40.0  --  34.8*  --   --  37.1  --   --   PLT 140*  --   --  130*  --   --  134*  --   --   APTT 24  --   --   --   --   --   --   --   --   LABPROT 14.2  --   --   --   --   --   --   --   --   INR 1.1  --   --   --   --   --   --   --   --   HEPARINUNFRC  --   --   --   --    < > 0.33  --  0.18* 0.27*  CREATININE 0.88 0.80  --  0.79  --  0.76  --   --   --   CKTOTAL 78  --   --   --   --   --   --   --   --   TROPONINIHS 10  --  28*  --   --   --   --   --   --    < > = values in this interval not displayed.    Estimated Creatinine Clearance: 48.8 mL/min (by C-G formula based on SCr of 0.76 mg/dL).   Medical History: Past Medical History:  Diagnosis Date   Chest pain    Coronary artery disease    Hypertension    LBBB (left bundle branch block)     Assessment: 82yoF who admitted on 10/20 as code stroke, underwent revascularization w/ NIR on 10/20 1419 given presentation with LVO of M1. Patient with new aflutter, requiring anticoagulation given cardioembolic etiology. CBC relatively stable this AM from admission.  Repeat heparin level remains subtherapeutic but trending up to 0.27.  Goal of Therapy:  Heparin level 0.3-0.5 units/ml Monitor platelets by anticoagulation protocol:  Yes   Plan:  Increase heparin infusion to 1150 units/hr F/u 8 hr heparin level  Fredonia Highland, PharmD, BCPS, [facility] Clinical Pharmacist Please check AMION for all [facility] Pharm[facility] numbers 04/07/2023

## 2023-04-07 NOTE — Progress Notes (Signed)
PHARMACY - ANTICOAGULATION CONSULT NOTE  Pharmacy Consult for heparin Indication: atrial fibrillation and stroke  Allergies  Allergen Reactions   Codeine Nausea And Vomiting   Penicillins Nausea And Vomiting    Patient Measurements: Height: 5\' 5"  (165.1 cm) Weight: 64 kg (141 lb 1.5 oz) IBW/kg (Calculated) : 57 Heparin Dosing Weight: 64  Vital Signs: Temp: 98.6 F (37 C) (10/23 0751) Temp Source: Oral (10/23 0751) BP: 154/66 (10/23 0751) Pulse Rate: 52 (10/23 0359)  Labs: Recent Labs    04/04/23 1018 04/04/23 1710 04/05/23 0755 04/05/23 2102 04/06/23 0721 04/06/23 1021 04/06/23 1500 04/06/23 2335 04/07/23 0441 04/07/23 0839  HGB 13.6  --  11.4*  --   --  12.2  --   --  11.4*  --   HCT 40.0  --  34.8*  --   --  37.1  --   --  34.0*  --   PLT  --   --  130*  --   --  134*  --   --  120*  --   HEPARINUNFRC  --   --   --    < > 0.33  --  0.18* 0.27*  --  0.55  CREATININE 0.80  --  0.79  --  0.76  --   --   --   --   --   TROPONINIHS  --  28*  --   --   --   --   --   --   --   --    < > = values in this interval not displayed.    Estimated Creatinine Clearance: 48.8 mL/min (by C-G formula based on SCr of 0.76 mg/dL).   Medical History: Past Medical History:  Diagnosis Date   Chest pain    Coronary artery disease    Hypertension    LBBB (left bundle branch block)     Assessment: 82yoF who admitted on 10/20 as code stroke, underwent revascularization w/ NIR on 10/20 1419 given presentation with LVO of M1. Patient with new aflutter, requiring anticoagulation given cardioembolic etiology. CBC relatively stable this AM from admission.  Repeat heparin level above goal at 0.55 following rate increase to 1150 units/hour from 1050 units/hour.   Goal of Therapy:  Heparin level 0.3-0.5 units/ml Monitor platelets by anticoagulation protocol: Yes   Plan:  Reduce heparin infusion to 1100 units/hr F/u 8 hr heparin level F/U transition to DOAC per Neuro   Jani Gravel, PharmD Clinical Pharmacist  04/07/2023 10:17 AM

## 2023-04-07 NOTE — Progress Notes (Signed)
  Inpatient Rehabilitation Admissions Coordinator   Met with patient at bedside for rehab assessment and then contacted her son, Fayrene Fearing by phone. We discussed goals and expectations of a possible CIR admit. He will discuss with family the caregiver expectations after a CIR admit and get back  to me after those discussions. He prefers Cir admit and feels he can arrange 24/7 supervision. I will begin insurance Auth with [facility] medicare for possible CIR admit pending approval. Please call me with any questions.   Ottie Glazier, RN, MSN Rehab Admissions Coordinator [phone]

## 2023-04-07 NOTE — Progress Notes (Signed)
Cardiology Progress Note  Patient ID: [name] MRN: [id number] DOB: 12-Jul-1940 Date of Encounter: 04/07/2023  Primary Cardiologist: None  Subjective   Chief Complaint: None.   HPI: Reports no symptoms today.   ROS:  All other ROS reviewed and negative. Pertinent positives noted in the HPI.     Inpatient Medications  Scheduled Meds:  atorvastatin  80 mg Oral Daily   losartan  100 mg Oral Daily   mouth rinse  15 mL Mouth Rinse 4 times per day   Continuous Infusions:  heparin 1,150 Units/hr (04/07/23 0147)   PRN Meds: acetaminophen **OR** acetaminophen (TYLENOL) oral liquid 160 mg/5 mL **OR** acetaminophen, hydrALAZINE, labetalol, mouth rinse, mouth rinse, senna-docusate   Vital Signs   Vitals:   04/06/23 2017 04/06/23 2342 04/07/23 0359 04/07/23 0751  BP: (!) 162/109 (!) 156/67 (!) 153/77 (!) 154/66  Pulse: 77 (!) 50 (!) 52   Resp: 18 16 16 20   Temp: 98.6 F (37 C) 98.5 F (36.9 C) 98.1 F (36.7 C) 98.6 F (37 C)  TempSrc: Oral Oral Oral Oral  SpO2: 99% 100% 100% 99%  Weight:      Height:        Intake/Output Summary (Last 24 hours) at 04/07/2023 1008 Last data filed at 04/07/2023 0600 Gross per 24 hour  Intake 28.5 ml  Output 400 ml  Net -371.5 ml      04/04/2023   11:00 AM 06/20/2020    1:13 PM 05/31/2017    2:23 PM  Last 3 Weights  Weight (lbs) 141 lb 1.5 oz 146 lb 4.8 oz 167 lb  Weight (kg) 64 kg 66.361 kg 75.751 kg      Telemetry  Overnight telemetry shows Afib 40-60 bpm, which I personally reviewed.   Physical Exam   Vitals:   04/06/23 2017 04/06/23 2342 04/07/23 0359 04/07/23 0751  BP: (!) 162/109 (!) 156/67 (!) 153/77 (!) 154/66  Pulse: 77 (!) 50 (!) 52   Resp: 18 16 16 20   Temp: 98.6 F (37 C) 98.5 F (36.9 C) 98.1 F (36.7 C) 98.6 F (37 C)  TempSrc: Oral Oral Oral Oral  SpO2: 99% 100% 100% 99%  Weight:      Height:        Intake/Output Summary (Last 24 hours) at 04/07/2023 1008 Last data filed at 04/07/2023 0600 Gross  per 24 hour  Intake 28.5 ml  Output 400 ml  Net -371.5 ml       04/04/2023   11:00 AM 06/20/2020    1:13 PM 05/31/2017    2:23 PM  Last 3 Weights  Weight (lbs) 141 lb 1.5 oz 146 lb 4.8 oz 167 lb  Weight (kg) 64 kg 66.361 kg 75.751 kg    Body mass index is 23.48 kg/m.  General: Well nourished, well developed, in no acute distress Head: Atraumatic, normal size  Eyes: PEERLA, EOMI  Neck: Supple, no JVD Endocrine: No thryomegaly Cardiac: Normal S1, S2; irregular rhythm  Lungs: Clear to auscultation bilaterally, no wheezing, rhonchi or rales  Abd: Soft, nontender, no hepatomegaly  Ext: No edema, pulses 2+ Musculoskeletal: R sided weakness   Labs  High Sensitivity Troponin:   Recent Labs  Lab 04/04/23 1005 04/04/23 1710  TROPONINIHS 10 28*     Cardiac EnzymesNo results for input(s): "TROPONINI" in the last 168 hours. No results for input(s): "TROPIPOC" in the last 168 hours.  Chemistry Recent Labs  Lab 04/04/23 1005 04/04/23 1018 04/05/23 0755 04/06/23 0721  NA 135 140 139  140  K 3.7 3.9 3.4* 3.9  CL 103 106 109 103  CO2 24  --  21* 22  GLUCOSE 169* 165* 111* 108*  BUN 13 12 8 9   CREATININE 0.88 0.80 0.79 0.76  CALCIUM 8.8*  --  8.9 10.0  PROT 7.2  --   --   --   ALBUMIN 4.0  --   --   --   AST 16  --   --   --   ALT 12  --   --   --   ALKPHOS 50  --   --   --   BILITOT 0.7  --   --   --   GFRNONAA >60  --  >60 >60  ANIONGAP 8  --  9 15    Hematology Recent Labs  Lab 04/05/23 0755 04/06/23 1021 04/07/23 0441  WBC 9.5 8.6 5.7  RBC 4.05 4.29 3.91  HGB 11.4* 12.2 11.4*  HCT 34.8* 37.1 34.0*  MCV 85.9 86.5 87.0  MCH 28.1 28.4 29.2  MCHC 32.8 32.9 33.5  RDW 12.7 12.8 12.7  PLT 130* 134* 120*   BNP Recent Labs  Lab 04/04/23 1005  BNP 228.0*    DDimer No results for input(s): "DDIMER" in the last 168 hours.   Radiology  MR BRAIN WO CONTRAST  Result Date: 04/05/2023 CLINICAL DATA:  Stroke, follow up EXAM: MRI HEAD WITHOUT CONTRAST TECHNIQUE:  Multiplanar, multiecho pulse sequences of the brain and surrounding structures were obtained without intravenous contrast. COMPARISON:  CT head 04/04/2023 FINDINGS: Brain: Acute left MCA territory infarct involving the insula, left temporal lobe in the corona radiata. There is a additional punctate acute infarcts in the right parietal lobe. There is susceptibility artifact in the anterior left temporal lobe, favored to represent a small petechial hemorrhage. No mass effect. No mass lesion. No hydrocephalus. No extra-axial fluid collection Vascular: Normal flow voids. Skull and upper cervical spine: Normal marrow signal. Sinuses/Orbits: No middle ear or mastoid effusion. Paranasal sinuses are clear Other: None. IMPRESSION: 1. Acute left MCA territory infarct involving the insula, left temporal lobe and corona radiata. Susceptibility artifact in the anterior left temporal lobe, favored to represent a small petechial hemorrhage. No mass effect. 2. Additional punctate embolic acute infarcts in the right parietal lobe. These results will be called to the ordering clinician or representative by the Radiologist Assistant, and communication documented in the PACS or [employer]. Electronically Signed   By: Lorenza Cambridge M.D.   On: 04/05/2023 12:06    Cardiac Studies  TTE 04/05/2023  1. Left ventricular ejection fraction, by estimation, is 45 to 50%. The  left ventricle has mildly decreased function. The left ventricle  demonstrates global hypokinesis. Left ventricular diastolic function could  not be evaluated.   2. Right ventricular systolic function is normal. The right ventricular  size is normal.   3. Left atrial size was mildly dilated.   4. Right atrial size was mildly dilated.   5. The mitral valve is normal in structure. Trivial mitral valve  regurgitation. No evidence of mitral stenosis.   6. The aortic valve is tricuspid. There is mild calcification of the  aortic valve. Aortic valve  regurgitation is not visualized. No aortic  stenosis is present.   7. The inferior vena cava is normal in size with <50% respiratory  variability, suggesting right atrial pressure of 8 mmHg.   Patient Profile  82 yo F with non-obstructive CAD, LBBB, HTN admitted with acute CVA and  new onset Afib.   Assessment & Plan   # New onset A-fib # Slow ventricular response -Rate controlled on no medication.  Will continue to hold beta-blocker or AV nodal agents.  She does have evidence of conduction disease but not symptomatic. -On heparin.  Start DOAC per neurology.  # Heart failure with midrange ejection fraction, EF 45-50% # LBBB -No beta-blocker due to slow ventricular response. -No symptoms of clinical heart failure.  No diuresis required. -Continue losartan 100 mg daily.  If blood pressure remains persistently elevated would add Aldactone 25 mg daily.  I will defer this to neurology as it appears she is being allowed to have high blood pressure in the setting of recent stroke. -I do not feel that strongly about SGLT2 inhibitor.  She really has no clinical heart failure.  # Left MCA stroke status post thrombectomy -Secondary to A-fib.  Per neurology.  Transition to DOAC as you are able  [facility] will sign off.   Medication Recommendations: As above Other recommendations (labs, testing, etc): None Follow up as an outpatient: We will arrange outpatient follow-up in 2 to 3 months.  The patient will likely go to acute inpatient rehab to recover from stroke.  For questions or updates, please contact [facility] Please consult [email or web address] for contact info under        Signed, Gerri Spore T. Flora Lipps, MD, [facility]  [facility]  04/07/2023 10:08 AM

## 2023-04-08 DIAGNOSIS — I4819 Other persistent atrial fibrillation: Secondary | ICD-10-CM | POA: Diagnosis not present

## 2023-04-08 DIAGNOSIS — I6300 Cerebral infarction due to thrombosis of unspecified precerebral artery: Secondary | ICD-10-CM | POA: Diagnosis not present

## 2023-04-08 DIAGNOSIS — I5022 Chronic systolic (congestive) heart failure: Secondary | ICD-10-CM | POA: Diagnosis not present

## 2023-04-08 LAB — CBC
HCT: 34.4 % — ABNORMAL LOW (ref 36.0–46.0)
Hemoglobin: 11.5 g/dL — ABNORMAL LOW (ref 12.0–15.0)
MCH: 28.5 pg (ref 26.0–34.0)
MCHC: 33.4 g/dL (ref 30.0–36.0)
MCV: 85.4 fL (ref 80.0–100.0)
Platelets: 131 10*3/uL — ABNORMAL LOW (ref 150–400)
RBC: 4.03 MIL/uL (ref 3.87–5.11)
RDW: 12.5 % (ref 11.5–15.5)
WBC: 5.1 10*3/uL (ref 4.0–10.5)
nRBC: 0 % (ref 0.0–0.2)

## 2023-04-08 LAB — HEPARIN LEVEL (UNFRACTIONATED): Heparin Unfractionated: 0.52 [IU]/mL (ref 0.30–0.70)

## 2023-04-08 MED ORDER — APIXABAN 5 MG PO TABS
5.0000 mg | ORAL_TABLET | Freq: Two times a day (BID) | ORAL | Status: DC
  Administered 2023-04-08 – 2023-04-12 (×9): 5 mg via ORAL
  Filled 2023-04-08 (×9): qty 1

## 2023-04-08 NOTE — Progress Notes (Signed)
Inpatient Rehabilitation Admissions Coordinator   I await insurance approval for possible [facility] admit.  Ottie Glazier, RN, MSN Rehab Admissions Coordinator [phone] 04/08/2023 1:48 PM

## 2023-04-08 NOTE — Progress Notes (Signed)
Cardiology Progress Note  Patient ID: [name] MRN: [id number] DOB: [birth year] Date of Encounter: 04/08/2023 Primary Cardiologist: None  Subjective   Chief Complaint: Chest pain  HPI: Describes sharp pain in her left lower chest.  She tells me she fell prior to admission and injured her ribs.  Exquisitely tender to palpation in the lower left chest wall over the ribs.  Noncardiac.  ROS:  All other ROS reviewed and negative. Pertinent positives noted in the HPI.     Vital Signs   Vitals:   04/07/23 2300 04/07/23 2335 04/08/23 0126 04/08/23 0335  BP: (!) 155/68 (!) 186/76 (!) 152/62 (!) 143/70  Pulse:  (!) 55  69  Resp:  18  16  Temp:  98.7 F (37.1 C)  98.5 F (36.9 C)  TempSrc:  Oral  Oral  SpO2:  100%  100%  Weight:      Height:        Intake/Output Summary (Last 24 hours) at 04/08/2023 0949 Last data filed at 04/08/2023 0456 Gross per 24 hour  Intake 442.27 ml  Output 1000 ml  Net -557.73 ml      04/04/2023   11:00 AM 06/20/2020    1:13 PM 05/31/2017    2:23 PM  Last 3 Weights  Weight (lbs) 141 lb 1.5 oz 146 lb 4.8 oz 167 lb  Weight (kg) 64 kg 66.361 kg 75.751 kg      Telemetry  Overnight telemetry shows Afib 50-70 bpm, PVC, which I personally reviewed.   Physical Exam   Vitals:   04/07/23 2300 04/07/23 2335 04/08/23 0126 04/08/23 0335  BP: (!) 155/68 (!) 186/76 (!) 152/62 (!) 143/70  Pulse:  (!) 55  69  Resp:  18  16  Temp:  98.7 F (37.1 C)  98.5 F (36.9 C)  TempSrc:  Oral  Oral  SpO2:  100%  100%  Weight:      Height:        Intake/Output Summary (Last 24 hours) at 04/08/2023 0949 Last data filed at 04/08/2023 0456 Gross per 24 hour  Intake 442.27 ml  Output 1000 ml  Net -557.73 ml       04/04/2023   11:00 AM 06/20/2020    1:13 PM 05/31/2017    2:23 PM  Last 3 Weights  Weight (lbs) 141 lb 1.5 oz 146 lb 4.8 oz 167 lb  Weight (kg) 64 kg 66.361 kg 75.751 kg    Body mass index is 23.48 kg/m.  General: Well nourished, well  developed, in no acute distress Head: Atraumatic, normal size  Eyes: PEERLA, EOMI  Neck: Supple, no JVD Endocrine: No thryomegaly Cardiac: Normal S1, S2; irregular rhythm, no murmurs Lungs: Clear to auscultation bilaterally, no wheezing, rhonchi or rales  Abd: Soft, nontender, no hepatomegaly  Ext: No edema, pulses 2+ Musculoskeletal: No deformities, BUE and BLE strength normal and equal Skin: Warm and dry, no rashes   Neuro: Alert and oriented to person, place, time, and situation, CNII-XII grossly intact, no focal deficits  Psych: Normal mood and affect   Cardiac Studies  TTE 04/05/2023  1. Left ventricular ejection fraction, by estimation, is 45 to 50%. The  left ventricle has mildly decreased function. The left ventricle  demonstrates global hypokinesis. Left ventricular diastolic function could  not be evaluated.   2. Right ventricular systolic function is normal. The right ventricular  size is normal.   3. Left atrial size was mildly dilated.   4. Right atrial size was mildly dilated.  5. The mitral valve is normal in structure. Trivial mitral valve  regurgitation. No evidence of mitral stenosis.   6. The aortic valve is tricuspid. There is mild calcification of the  aortic valve. Aortic valve regurgitation is not visualized. No aortic  stenosis is present.   7. The inferior vena cava is normal in size with <50% respiratory  variability, suggesting right atrial pressure of 8 mmHg.   Patient Profile  82 yo F with non-obstructive CAD, LBBB, HTN admitted with acute CVA and new onset Afib.   Assessment & Plan   # Chest pain, noncardiac -She describes sharp chest discomfort over the left lower chest.  She reports she fell and injured her ribs.  She is exquisitely tender palpation of the left chest wall.  Would recommend Tylenol scheduled as this can help.  Can also pursue pain management per primary team. -EKG shows left bundle branch block and A-fib with PVCs.  This is  unchanged.  No further cardiac workup indicated.  # New onset A-fib # A-fib with slow ventricular response -Did not tolerate metoprolol.  Would hold this.  A-fib is rate controlled on no medications.  Recommending rate control strategy until she has recovered from her stroke.  Currently on heparin drip.  Transition to DOAC per neurology. -Plan for outpatient reevaluation for potential cardioversion.  # Heart failure with midrange ejection fraction, EF 45-50% # Left bundle branch block -No symptoms of heart failure.  Euvolemic on exam.  Continue losartan 100 mg daily. -Can add Aldactone 25 mg daily if BP needs to be lower.  Defer this to neurology given stroke. -No need for Lasix. -No beta-blocker due to A-fib with slow ventricular response. -Overall, do not feel this needs aggressive treatment.  She is recovering from a stroke.  Her EF is not far off from normal and she has no clinical evidence of heart failure.  # Left MCA Stroke s/p Thrombectomy -per neruo  [facility] will sign off.   Medication Recommendations:  As above Other recommendations (labs, testing, etc):  none. Follow up as an outpatient:  2-3 months with me. Will need inpatient rehab.   For questions or updates, please contact [facility] Please consult [email or web address] for contact info under   Signed, Gerri Spore T. Flora Lipps, MD, [facility]  [facility]  04/08/2023 9:49 AM

## 2023-04-08 NOTE — Discharge Instructions (Signed)

## 2023-04-08 NOTE — Plan of Care (Signed)
  Problem: Education: Goal: Knowledge of disease or condition will improve Outcome: Progressing Goal: Knowledge of secondary prevention will improve (MUST DOCUMENT ALL) Outcome: Progressing Goal: Knowledge of patient specific risk factors will improve (Mark N/A or DELETE if not current risk factor) Outcome: Progressing   Problem: Ischemic Stroke/TIA Tissue Perfusion: Goal: Complications of ischemic stroke/TIA will be minimized Outcome: Progressing   Problem: Coping: Goal: Will verbalize positive feelings about self Outcome: Progressing Goal: Will identify appropriate support needs Outcome: Progressing   Problem: Health Behavior/Discharge Planning: Goal: Ability to manage health-related needs will improve Outcome: Progressing Goal: Goals will be collaboratively established with patient/family Outcome: Progressing   Problem: Self-Care: Goal: Ability to participate in self-care as condition permits will improve Outcome: Progressing Goal: Verbalization of feelings and concerns over difficulty with self-care will improve Outcome: Progressing Goal: Ability to communicate needs accurately will improve Outcome: Progressing   Problem: Nutrition: Goal: Risk of aspiration will decrease Outcome: Progressing Goal: Dietary intake will improve Outcome: Progressing   Problem: Education: Goal: Understanding of CV disease, CV risk reduction, and recovery process will improve Outcome: Progressing Goal: Individualized Educational Video(s) Outcome: Progressing   Problem: Activity: Goal: Ability to return to baseline activity level will improve Outcome: Progressing   Problem: Cardiovascular: Goal: Ability to achieve and maintain adequate cardiovascular perfusion will improve Outcome: Progressing Goal: Vascular access site(s) Level 0-1 will be maintained Outcome: Progressing   Problem: Health Behavior/Discharge Planning: Goal: Ability to safely manage health-related needs after  discharge will improve Outcome: Progressing   Problem: Education: Goal: Knowledge of General Education information will improve Description: Including pain rating scale, medication(s)/side effects and non-pharmacologic comfort measures Outcome: Progressing   Problem: Health Behavior/Discharge Planning: Goal: Ability to manage health-related needs will improve Outcome: Progressing   Problem: Clinical Measurements: Goal: Ability to maintain clinical measurements within normal limits will improve Outcome: Progressing Goal: Will remain free from infection Outcome: Progressing Goal: Diagnostic test results will improve Outcome: Progressing Goal: Respiratory complications will improve Outcome: Progressing Goal: Cardiovascular complication will be avoided Outcome: Progressing   Problem: Activity: Goal: Risk for activity intolerance will decrease Outcome: Progressing   Problem: Nutrition: Goal: Adequate nutrition will be maintained Outcome: Progressing   Problem: Coping: Goal: Level of anxiety will decrease Outcome: Progressing   Problem: Elimination: Goal: Will not experience complications related to bowel motility Outcome: Progressing Goal: Will not experience complications related to urinary retention Outcome: Progressing   Problem: Pain Managment: Goal: General experience of comfort will improve Outcome: Progressing   Problem: Safety: Goal: Ability to remain free from injury will improve Outcome: Progressing   Problem: Skin Integrity: Goal: Risk for impaired skin integrity will decrease Outcome: Progressing   

## 2023-04-08 NOTE — Progress Notes (Signed)
Physical Therapy Treatment  Patient Details Name: [name] MRN: [id number] DOB: [birth year] Today's Date: 04/08/2023   History of Present Illness 82 y.o. female who presented 04/04/23 with acute onset right-sided weakness, right facial droop and aphasia.  CT angiogram=  left M1 occlusion  s/p thrombectomy on 10/20. 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 PMH significant for HTN, LBBB, CAD.    PT Comments  Pt progressing towards physical therapy goals. Pt was able to express need for BM and ask for [name] LP appropriately. She progressed to hallway ambulation and was able to achieve ~175' with RW and min-mod assist for walker management, balance, and R UE management. Continues to demonstrate  R side inattention and requires cues to attend to the R side of the hallway. Poor ability to scan effectively to locate objects on the R.    If plan is discharge home, recommend the following: Assistance with cooking/housework;Assist for transportation;Help with stairs or ramp for entrance;Supervision due to cognitive status;A little help with walking and/or transfers   Can travel by private vehicle        Equipment Recommendations  Other (comment) (defer to post acute)    Recommendations for Other [facility] consult     Precautions / Restrictions Precautions Precautions: Fall Precaution Comments: SBP <180 Restrictions Weight Bearing Restrictions: No     Mobility  Bed Mobility Overal bed mobility: Needs Assistance Bed Mobility: Supine to Sit     Supine to sit: Contact guard     General bed mobility comments: Increased time and effort to transition to/from EOB. PT providing close guard for safety however letting pt achieve transfers without assist.    Transfers Overall transfer level: Needs assistance Equipment used:  Rolling walker (2 wheels) Transfers: Sit to/from Stand, Bed to chair/wheelchair/BSC Sit to Stand: Min assist   Step pivot transfers: Min assist       General transfer comment: Min A with Max A to place RUE on the RW. Assist for balance support as pt powers up to full stand.    Ambulation/Gait Ambulation/Gait assistance: Min assist Gait Distance (Feet): 175 Feet Assistive device: Rolling walker (2 wheels) Gait Pattern/deviations: Step-through pattern, Decreased step length - left, Drifts right/left Gait velocity: Decreased Gait velocity interpretation: <1.31 ft/sec, indicative of household ambulator   General Gait Details: Min A for R sided obstacles pt runs AD into obstacles and requires verbal cues to scan for objects on the R. Consistent assist to steer walker as she veers R with it while looking L. Assist to keep R hand on walker throughout.   Stairs             Wheelchair Mobility     Tilt Bed    Modified Rankin (Stroke Patients Only) Modified Rankin (Stroke Patients Only) Pre-Morbid Rankin Score: No symptoms Modified Rankin: Moderately severe disability     Balance Overall balance assessment: Needs assistance Sitting-balance support: Single extremity supported, Feet supported Sitting balance-Leahy Scale: Fair Sitting balance - Comments: CGA at EOB. Postural control: Right lateral lean Standing balance support: Bilateral upper extremity supported, During functional activity Standing balance-Leahy Scale: Poor                              Cognition Arousal: Alert Behavior During Therapy: Flat affect Overall Cognitive Status: Impaired/Different from baseline Area of  Impairment: Orientation, Attention, Memory, Following commands, Safety/judgement, Awareness, Problem solving                 Orientation Level: Situation, Time, Disoriented to, Person, Place Current Attention Level: Sustained Memory: Decreased recall of precautions, Decreased  short-term memory Following Commands: Follows one step commands consistently Safety/Judgement: Decreased awareness of safety, Decreased awareness of deficits Awareness: Intellectual Problem Solving: Slow processing, Requires verbal cues General Comments: Pt reports her last name is [name], and states she married a man whose last name is [name]. Pt confused about being in the hospital and perseverates on calling grandson to come get her and take her home. Thinks niece is in the room visiting but no one present.        Exercises      General Comments        Pertinent Vitals/Pain Pain Assessment Pain Assessment: Faces Faces Pain Scale: No hurt Pain Intervention(s): Monitored during session    Home Living                          Prior Function            PT Goals (current goals can now be found in the care plan section) Acute Rehab PT Goals Patient Stated Goal: recover mobility/independence PT Goal Formulation: With patient Time For Goal Achievement: 04/20/23 Potential to Achieve Goals: Good Progress towards PT goals: Progressing toward goals    Frequency    Min 1X/week      PT Plan      Co-evaluation              AM-PAC PT "6 Clicks" Mobility   Outcome Measure  Help needed turning from your back to your side while in a flat bed without using bedrails?: A Little Help needed moving from lying on your back to sitting on the side of a flat bed without using bedrails?: A Little Help needed moving to and from a bed to a chair (including a wheelchair)?: A Little Help needed standing up from a chair using your arms (e.g., wheelchair or bedside chair)?: A Little Help needed to walk in hospital room?: A Lot Help needed climbing 3-5 steps with a railing? : A Lot 6 Click Score: 16    End of Session Equipment Utilized During Treatment: Gait belt Activity Tolerance: Patient tolerated treatment well Patient left: in chair;with call bell/phone within  reach;with chair alarm set Nurse Communication: Mobility status PT Visit Diagnosis: Unsteadiness on feet (R26.81);Other abnormalities of gait and mobility (R26.89);Muscle weakness (generalized) (M62.81);Difficulty in walking, not elsewhere classified (R26.2);Other symptoms and signs involving the nervous system (R29.898);Hemiplegia and hemiparesis Hemiplegia - Right/Left: Right Hemiplegia - dominant/non-dominant: Dominant Hemiplegia - caused by: Cerebral infarction     Time: 1306-1330 PT Time Calculation (min) (ACUTE ONLY): 24 min  Charges:    $Gait Training: 23-37 mins PT General Charges $$ ACUTE PT VISIT: 1 Visit                     Conni Slipper, PT, DPT Acute Rehabilitation [facility] Preferred Office: [phone]    Marylynn Pearson 04/08/2023, 1:56 PM

## 2023-04-08 NOTE — Progress Notes (Signed)
   04/08/23 1700  What Happened  Was fall witnessed? No  Was patient injured? No  Patient found on floor  Found by Staff-comment  Stated prior activity ambulating-unassisted (when entering room no lights on bed and bed was plugged in.)  Provider Notification  Provider Name/Title Dr. Secundino Ginger, MD  Date Provider Notified 04/08/23  Time Provider Notified 1645  Method of Notification Page (secured chat)  Notification Reason Fall  Provider response No new orders (according to him monitor and watch what she does)  Date of Provider Response 04/08/23  Time of Provider Response 1648  Follow Up  Family notified Yes - comment (according to son she has this episode at home also, wandering and does not know where to go.)  Time family notified 36 (Mr. Brett Canales)  Additional tests No  Simple treatment Other (comment) (assessed if there is pain. manage high BP)  Progress note created (see row info) Yes  Adult Fall Risk Assessment  Risk Factor Category (scoring not indicated) High fall risk per protocol (document High fall risk)  Adult Fall Risk Interventions  Additional Interventions PT/OT need assessed if change in mobility from baseline;HeadStart bed sensor (education/return demonstration);Use of appropriate toileting equipment (bedpan, BSC, etc.);Other (Comment) (floor mat put in place, bed alarm fixed.)  Screening for Fall Injury Risk (To be completed on HIGH fall risk patients) - Assessing Need for Floor Mats  Risk For Fall Injury- Criteria for Floor Mats Confusion/dementia (+NuDESC, CIWA, TBI, etc.)

## 2023-04-08 NOTE — Plan of Care (Signed)
Problem: Education: Goal: Knowledge of disease or condition will improve Outcome: Progressing Goal: Knowledge of secondary prevention will improve (MUST DOCUMENT ALL) Outcome: Progressing Goal: Knowledge of patient specific risk factors will improve Loraine Leriche N/A or DELETE if not current risk factor) Outcome: Progressing   Problem: Ischemic Stroke/TIA Tissue Perfusion: Goal: Complications of ischemic stroke/TIA will be minimized Outcome: Progressing   Problem: Coping: Goal: Will verbalize positive feelings about self Outcome: Progressing Goal: Will identify appropriate support needs Outcome: Progressing   Problem: Health Behavior/Discharge Planning: Goal: Ability to manage health-related needs will improve Outcome: Progressing Goal: Goals will be collaboratively established with patient/family Outcome: Progressing   Problem: Self-Care: Goal: Ability to participate in self-care as condition permits will improve Outcome: Progressing Goal: Verbalization of feelings and concerns over difficulty with self-care will improve Outcome: Progressing Goal: Ability to communicate needs accurately will improve Outcome: Progressing   Problem: Nutrition: Goal: Risk of aspiration will decrease Outcome: Progressing Goal: Dietary intake will improve Outcome: Progressing   Problem: Activity: Goal: Ability to return to baseline activity level will improve Outcome: Progressing   Problem: Cardiovascular: Goal: Ability to achieve and maintain adequate cardiovascular perfusion will improve Outcome: Progressing Goal: Vascular access site(s) Level 0-1 will be maintained Outcome: Progressing   Problem: Health Behavior/Discharge Planning: Goal: Ability to safely manage health-related needs after discharge will improve Outcome: Progressing   Problem: Education: Goal: Knowledge of General Education information will improve Description: Including pain rating scale, medication(s)/side effects and  non-pharmacologic comfort measures Outcome: Progressing   Problem: Health Behavior/Discharge Planning: Goal: Ability to manage health-related needs will improve Outcome: Progressing   Problem: Clinical Measurements: Goal: Ability to maintain clinical measurements within normal limits will improve Outcome: Progressing Goal: Will remain free from infection Outcome: Progressing Goal: Diagnostic test results will improve Outcome: Progressing Goal: Respiratory complications will improve Outcome: Progressing Goal: Cardiovascular complication will be avoided Outcome: Progressing   Problem: Activity: Goal: Risk for activity intolerance will decrease Outcome: Progressing   Problem: Nutrition: Goal: Adequate nutrition will be maintained Outcome: Progressing   Problem: Coping: Goal: Level of anxiety will decrease Outcome: Progressing   Problem: Elimination: Goal: Will not experience complications related to bowel motility Outcome: Progressing Goal: Will not experience complications related to urinary retention Outcome: Progressing   Problem: Pain Managment: Goal: General experience of comfort will improve Outcome: Progressing   Problem: Safety: Goal: Ability to remain free from injury will improve Outcome: Progressing   Problem: Skin Integrity: Goal: Risk for impaired skin integrity will decrease Outcome: Progressing

## 2023-04-08 NOTE — Progress Notes (Signed)
STROKE TEAM PROGRESS NOTE   BRIEF HPI [name] is a 82 y.o. female with history of CAD, hypertension and left bundle branch block presenting with acute onset right-sided weakness, right facial droop and aphasia.   CT angiogram revealed left M1 occlusion, and patient was taken to interventional radiology for mechanical thrombectomy.  This was successful, with TICI 3 revascularization was achieved after 2 passes.  She was extubated postprocedure and appears to have improved but remains disoriented with some right-sided weakness.   SIGNIFICANT HOSPITAL EVENTS 10/20: Patient presented to hospital, mechanical thrombectomy performed 10/22: Patient transferred out of the ICU  INTERIM HISTORY/SUBJECTIVE Patient has been hemodynamically stable overnight with Tmax of 98.8 mild confusion right-sided weakness persist.    Therapist recommend inpatient rehab she remains on IV heparin drip.   OBJECTIVE Today's Vitals   04/08/23 0335 04/08/23 0720 04/08/23 1000 04/08/23 1201  BP: (!) 143/70  (!) 151/67 (!) 154/67  Pulse: 69  76 99  Resp: 16  18 16   Temp: 98.5 F (36.9 C)  97.7 F (36.5 C) 98.8 F (37.1 C)  TempSrc: Oral  Oral Oral  SpO2: 100%  100% 98%  Weight:      Height:      PainSc:  0-No pain  0-No pain   Body mass index is 23.48 kg/m.  CBC    Component Value Date/Time   WBC 5.1 04/08/2023 0557   RBC 4.03 04/08/2023 0557   HGB 11.5 (L) 04/08/2023 0557   HCT 34.4 (L) 04/08/2023 0557   PLT 131 (L) 04/08/2023 0557   MCV 85.4 04/08/2023 0557   MCH 28.5 04/08/2023 0557   MCHC 33.4 04/08/2023 0557   RDW 12.5 04/08/2023 0557   LYMPHSABS 1.2 04/05/2023 0755   MONOABS 0.4 04/05/2023 0755   EOSABS 0.0 04/05/2023 0755   BASOSABS 0.0 04/05/2023 0755    BMET    Component Value Date/Time   NA 140 04/06/2023 0721   K 3.9 04/06/2023 0721   CL 103 04/06/2023 0721   CO2 22 04/06/2023 0721   GLUCOSE 108 (H) 04/06/2023 0721   BUN 9 04/06/2023 0721   CREATININE 0.76 04/06/2023  0721   CREATININE 1.02 (H) 03/20/2016 1630   CALCIUM 10.0 04/06/2023 0721   GFRNONAA >60 04/06/2023 0721    IMAGING past 24 hours No results found.  Vitals:   04/08/23 0126 04/08/23 0335 04/08/23 1000 04/08/23 1201  BP: (!) 152/62 (!) 143/70 (!) 151/67 (!) 154/67  Pulse:  69 76 99  Resp:  16 18 16   Temp:  98.5 F (36.9 C) 97.7 F (36.5 C) 98.8 F (37.1 C)  TempSrc:  Oral Oral Oral  SpO2:  100% 100% 98%  Weight:      Height:         PHYSICAL EXAM General:  Alert, well-nourished, well-developed pleasant elderly Caucasian lady in no acute distress Psych:  Mood and affect appropriate for situation CV: Regular rate and rhythm on monitor Respiratory:  Regular, unlabored respirations on room air GI: Abdomen soft and nontender   NEURO:  Mental Status: AA&Ox2 Speech/Language: speech is with dysarthria but no aphasia but slight word hesitancy and nonfluent speech  Cranial Nerves:  II: PERRL.   III, IV, VI: EOMI. Eyelids elevate symmetrically.  V: Sensation is intact to light touch and symmetrical to face.  VII: Right facial droop VIII: hearing intact to voice. IX, X: Voice is mildly dysarthric XII: tongue is midline without fasciculations. Motor: 5/5 strength to left upper and lower extremities,  2-3 out of 5 strength to right upper extremity, 4 out of 5 strength to right lower extremity Tone: is normal and bulk is normal Sensation- Intact to light touch bilaterally.   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 ASPECTS 10.  CTA head & neck with CTP emergent LVO at left M1 segment with 146 cc of penumbra and no core infarct, atherosclerosis with flow reducing stenosis of right vertebral origin, 2 mm left superior cerebellar aneurysm MRI acute left MCA territory infarct involving insula left temporal lobe and corona radiata with small  petechial hemorrhage in anterior left temporal lobe, additional punctate embolic infarcts in right parietal lobe 2D Echo EF 45 to 50%, left atrium mildly dilated, right atrium mildly dilated, no atrial level shunt LDL 87 HgbA1c 5.6 VTE prophylaxis -SCDs aspirin 81 mg daily prior to admission, now will begin anticoagulation with heparin and transition to Eliquis today Therapy recommendations: CIR Disposition: Pending  Atrial fibrillation Patient was found to have new onset atrial fibrillation today Continue telemetry monitoring Continue anticoagulation with heparin Cardiology consult for new onset A-fib  Hypertension Home meds: Clonidine 0.1 mg twice daily, lisinopril 40 mg daily, losartan 100 mg daily, metoprolol 100 mg every 24 hours Stable Blood Pressure Goal: SBP 120-160 for first 24 hours then less than 180   Hyperlipidemia Home meds: Atorvastatin 40 mg daily, increased to 80 LDL 87, goal < 70 Continue statin at discharge  Dysphagia Patient has post-stroke dysphagia, SLP consulted    Diet   DIET - DYS 1 Fluid consistency: Nectar Thick   Advance diet as tolerated  Other Stroke Risk Factors Coronary artery disease   Other Active Problems None  Hospital day # 4  Patient presented with aphasia right hemiparesis due to left M1 occlusion and underwent successful mechanical thrombectomy with excellent revascularization after 2 passes.  She is doing well   and has only mild nonfluent speech and right hemiparesis.  Continue mobilization out of bed and and ongoing therapies.  Transfer to inpatient rehab when bed available.  Will transition IV heparin to  Eliquis today.  Continue  Aggressive risk factor modification.  Long discussion at the bedside with the patient's husband and daughter and answered questions.   Hopefully transfer to inpatient rehab in a few days when bed available. Greater than 50% time during this 35-minute visit was spent on counseling or coordination of care  about her embolic stroke and discussion about need for anticoagulation risk-benefit of the same and answered questions     [name], MD Medical Director [facility] Pager: [phone] 04/08/2023 2:11 PM   To contact Stroke Continuity provider, please refer to [email or web address]. After hours, contact General Neurology

## 2023-04-08 NOTE — Progress Notes (Addendum)
PHARMACY - ANTICOAGULATION CONSULT NOTE  Pharmacy Consult for heparin Indication: atrial fibrillation and stroke  Allergies  Allergen Reactions   Codeine Nausea And Vomiting   Penicillins Nausea And Vomiting    Patient Measurements: Height: 5\' 5"  (165.1 cm) Weight: 64 kg (141 lb 1.5 oz) IBW/kg (Calculated) : 57 Heparin Dosing Weight: 64  Vital Signs: Temp: 98.5 F (36.9 C) (10/24 0335) Temp Source: Oral (10/24 0335) BP: 143/70 (10/24 0335) Pulse Rate: 69 (10/24 0335)  Labs: Recent Labs     0000 04/06/23 0721 04/06/23 1021 04/06/23 1500 04/07/23 0441 04/07/23 0839 04/07/23 1639 04/07/23 1819 04/08/23 0557  HGB   < >  --  12.2  --  11.4*  --   --   --  11.5*  HCT  --   --  37.1  --  34.0*  --   --   --  34.4*  PLT  --   --  134*  --  120*  --   --   --  131*  HEPARINUNFRC  --  0.33  --    < >  --  0.55  --  0.38 0.52  CREATININE  --  0.76  --   --   --   --   --   --   --   TROPONINIHS  --   --   --   --   --   --  35*  --   --    < > = values in this interval not displayed.    Estimated Creatinine Clearance: 48.8 mL/min (by C-G formula based on SCr of 0.76 mg/dL).   Assessment: 82yoF who admitted on 10/20 as code stroke, underwent revascularization w/ NIR on 10/20 1419 given presentation with LVO of M1. Patient with new aflutter, requiring anticoagulation given cardioembolic etiology. CBC relatively stable from admission.  Heparin level now slightly above goal at 0.52 with heparin running at 1100 units/hour. Patient was previously subtherapeutic on 1050 units/hour, though she may now be accumulating drug. CBC has remained stable. No signs of bleeding or issues with heparin infusion noted.   Goal of Therapy:  Heparin level 0.3-0.5 units/ml Monitor platelets by anticoagulation protocol: Yes   Plan:  Reduce heparin infusion to 1050 units/hr F/U AM labs F/U transition to DOAC per Neuro    ADDENDUM:  Pharmacy consulted to transition heparin to apixaban.    STOP heparin infusion START apixaban 5 mg BID- administer first dose at the same time heparin infusion is stopped  Plan communicated to primary RN    Jani Gravel, PharmD Clinical Pharmacist  04/08/2023 8:07 AM

## 2023-04-09 DIAGNOSIS — I6300 Cerebral infarction due to thrombosis of unspecified precerebral artery: Secondary | ICD-10-CM | POA: Diagnosis not present

## 2023-04-09 LAB — CBC
HCT: 38.1 % (ref 36.0–46.0)
Hemoglobin: 12.8 g/dL (ref 12.0–15.0)
MCH: 28.4 pg (ref 26.0–34.0)
MCHC: 33.6 g/dL (ref 30.0–36.0)
MCV: 84.5 fL (ref 80.0–100.0)
Platelets: 181 10*3/uL (ref 150–400)
RBC: 4.51 MIL/uL (ref 3.87–5.11)
RDW: 12.8 % (ref 11.5–15.5)
WBC: 6 10*3/uL (ref 4.0–10.5)
nRBC: 0 % (ref 0.0–0.2)

## 2023-04-09 MED ORDER — ORAL CARE MOUTH RINSE: 15.0000 mL | OROMUCOSAL | Status: DC | PRN

## 2023-04-09 MED ORDER — ENSURE ENLIVE PO LIQD
237.0000 mL | Freq: Two times a day (BID) | ORAL | Status: DC
Start: 2023-04-10 — End: 2023-04-12
  Administered 2023-04-10 – 2023-04-12 (×5): 237 mL via ORAL

## 2023-04-09 NOTE — Progress Notes (Signed)
Physical Therapy Treatment Patient Details Name: [name] MRN: [id number] DOB: Apr 28, 1941 Today's Date: 04/09/2023   History of Present Illness 82 y.o. female who presented 04/04/23 with acute onset right-sided weakness, right facial droop and aphasia.  CT angiogram=  left M1 occlusion  s/p thrombectomy on 10/20. 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 PMH significant for HTN, LBBB, CAD.    PT Comments  Pt greeted resting in bed and agreeable to session with continued progress towards acute goals. Pt continued to requiring min A to complete transfers and gait with RW for support with pt demonstrating continued decreased R attention. Pt challenged to locate items on R side of hall with pt unable without stopping and max redirection of scanning hall to include R side. Pt able to locate items easily on return when on L. Pt continues to require min-mod assist to manage RW throughout gait and maintain RUE on RW. Current plan remains appropriate to address deficits and maximize functional independence and decrease caregiver burden. Pt continues to benefit from skilled PT services to progress toward functional mobility goals.      If plan is discharge home, recommend the following: Assistance with cooking/housework;Assist for transportation;Help with stairs or ramp for entrance;Supervision due to cognitive status;A little help with walking and/or transfers   Can travel by private vehicle        Equipment Recommendations  Other (comment) (defer to post acute)    Recommendations for Other Services       Precautions / Restrictions Precautions Precautions: Fall Precaution Comments: SBP <180 Restrictions Weight Bearing Restrictions: No     Mobility  Bed Mobility Overal bed mobility: Needs Assistance Bed Mobility: Supine to  Sit, Sit to Supine     Supine to sit: Contact guard Sit to supine: Mod assist   General bed mobility comments: min A to return RLE to bed at end of session, increased time for pt to visually locate RUE as pt leaving it behind in bed, education on using LLE to pick up and place and attend to RUE    Transfers Overall transfer level: Needs assistance Equipment used: Rolling walker (2 wheels) Transfers: Sit to/from Stand, Bed to chair/wheelchair/BSC Sit to Stand: Min assist           General transfer comment: min A to steady on rise with max A to place RUE on the RW.    Ambulation/Gait Ambulation/Gait assistance: Min assist Gait Distance (Feet): 200 Feet Assistive device: Rolling walker (2 wheels) Gait Pattern/deviations: Step-through pattern, Decreased step length - left, Drifts right/left Gait velocity: Decreased     General Gait Details: Min A for R sided obstacles pt runs AD into obstacles and requires verbal cues to scan for objects on the R, challeneged pt to find jar of mints and brain figure with pt only looking to L, having to stop to attend to R, able to locate on return.   Stairs             Wheelchair Mobility     Tilt Bed    Modified Rankin (Stroke Patients Only) Modified Rankin (Stroke Patients Only) Pre-Morbid Rankin Score: No symptoms Modified Rankin: Moderately severe disability     Balance Overall balance assessment: Needs assistance Sitting-balance support: Single extremity supported, Feet supported Sitting balance-Leahy Scale: Fair Sitting balance - Comments: CGA at EOB. Postural control: Right lateral lean  Standing balance support: Bilateral upper extremity supported, During functional activity Standing balance-Leahy Scale: Poor Standing balance comment: R LOB poor safety awareness                            Cognition Arousal: Alert Behavior During Therapy: WFL for tasks assessed/performed Overall Cognitive Status:  Impaired/Different from baseline Area of Impairment: Attention, Memory, Following commands, Safety/judgement, Awareness, Problem solving, Orientation                 Orientation Level: Disoriented to, Situation (stating  "they keep telling me I had a stroke, did I have a stroke?") Current Attention Level: Sustained Memory: Decreased recall of precautions, Decreased short-term memory Following Commands: Follows one step commands consistently Safety/Judgement: Decreased awareness of safety, Decreased awareness of deficits Awareness: Intellectual Problem Solving: Slow processing, Requires verbal cues General Comments: repeating same conversation throughout session. repeating "i jsut dont know why this happened to me"        Exercises      General Comments        Pertinent Vitals/Pain Pain Assessment Pain Assessment: No/denies pain    Home Living                          Prior Function            PT Goals (current goals can now be found in the care plan section) Acute Rehab PT Goals Patient Stated Goal: recover mobility/independence PT Goal Formulation: With patient Time For Goal Achievement: 04/20/23 Progress towards PT goals: Progressing toward goals    Frequency    Min 1X/week      PT Plan      Co-evaluation              AM-PAC PT "6 Clicks" Mobility   Outcome Measure  Help needed turning from your back to your side while in a flat bed without using bedrails?: A Little Help needed moving from lying on your back to sitting on the side of a flat bed without using bedrails?: A Little Help needed moving to and from a bed to a chair (including a wheelchair)?: A Little Help needed standing up from a chair using your arms (e.g., wheelchair or bedside chair)?: A Little Help needed to walk in hospital room?: A Lot Help needed climbing 3-5 steps with a railing? : A Lot 6 Click Score: 16    End of Session Equipment Utilized During Treatment:  Gait belt Activity Tolerance: Patient tolerated treatment well Patient left: with call bell/phone within reach;in bed;with bed alarm set Nurse Communication: Mobility status PT Visit Diagnosis: Unsteadiness on feet (R26.81);Other abnormalities of gait and mobility (R26.89);Muscle weakness (generalized) (M62.81);Difficulty in walking, not elsewhere classified (R26.2);Other symptoms and signs involving the nervous system (R29.898);Hemiplegia and hemiparesis Hemiplegia - Right/Left: Right Hemiplegia - dominant/non-dominant: Dominant Hemiplegia - caused by: Cerebral infarction     Time: 0865-7846 PT Time Calculation (min) (ACUTE ONLY): 16 min  Charges:    $Gait Training: 8-22 mins PT General Charges $$ ACUTE PT VISIT: 1 Visit                     Petrona Wyeth R. PTA Acute Rehabilitation Services Office: [phone]   Catalina Antigua 04/09/2023, 3:59 PM

## 2023-04-09 NOTE — Progress Notes (Addendum)
STROKE TEAM PROGRESS NOTE   BRIEF HPI [name] is a 82 y.o. female with history of CAD, hypertension and left bundle branch block presenting with acute onset right-sided weakness, right facial droop and aphasia.   CT angiogram revealed left M1 occlusion, and patient was taken to interventional radiology for mechanical thrombectomy.  This was successful, with TICI 3 revascularization was achieved after 2 passes.  She was extubated postprocedure and appears to have improved but remains disoriented with some right-sided weakness.   SIGNIFICANT HOSPITAL EVENTS 10/20: Patient presented to hospital, mechanical thrombectomy performed 10/22: Patient transferred out of the ICU  INTERIM HISTORY/SUBJECTIVE On Eliquis now. Fall yesterday?  Did not hit her head.  Reporting some soreness at the left rib cage relieved by tylenol.   OBJECTIVE Today's Vitals   04/08/23 1930 04/08/23 2341 04/09/23 0316 04/09/23 0710  BP:  (!) 168/94 133/63   Pulse:  83 (!) 57   Resp:      Temp:  98.1 F (36.7 C) 98.8 F (37.1 C)   TempSrc:  Oral Oral   SpO2:  98% 100%   Weight:      Height:      PainSc: 0-No pain   0-No pain   Body mass index is 23.48 kg/m.  CBC    Component Value Date/Time   WBC 5.1 04/08/2023 0557   RBC 4.03 04/08/2023 0557   HGB 11.5 (L) 04/08/2023 0557   HCT 34.4 (L) 04/08/2023 0557   PLT 131 (L) 04/08/2023 0557   MCV 85.4 04/08/2023 0557   MCH 28.5 04/08/2023 0557   MCHC 33.4 04/08/2023 0557   RDW 12.5 04/08/2023 0557   LYMPHSABS 1.2 04/05/2023 0755   MONOABS 0.4 04/05/2023 0755   EOSABS 0.0 04/05/2023 0755   BASOSABS 0.0 04/05/2023 0755    BMET    Component Value Date/Time   NA 140 04/06/2023 0721   K 3.9 04/06/2023 0721   CL 103 04/06/2023 0721   CO2 22 04/06/2023 0721   GLUCOSE 108 (H) 04/06/2023 0721   BUN 9 04/06/2023 0721   CREATININE 0.76 04/06/2023 0721   CREATININE 1.02 (H) 03/20/2016 1630   CALCIUM 10.0 04/06/2023 0721   GFRNONAA >60 04/06/2023  0721    IMAGING past 24 hours No results found.  Vitals:   04/08/23 1637 04/08/23 1816 04/08/23 2341 04/09/23 0316  BP: (!) 199/80 (!) 168/71 (!) 168/94 133/63  Pulse: 78 88 83 (!) 57  Resp: 17 19    Temp:  98 F (36.7 C) 98.1 F (36.7 C) 98.8 F (37.1 C)  TempSrc:  Oral Oral Oral  SpO2: 98% 99% 98% 100%  Weight:      Height:         PHYSICAL EXAM General:  Alert, well-nourished, well-developed pleasant elderly Caucasian lady in no acute distress Psych:  Mood and affect appropriate for situation CV: Regular rate and rhythm on monitor Respiratory:  Regular, unlabored respirations on room air GI: Abdomen soft and nontender   NEURO:  Mental Status: AA&Ox2 Speech/Language: speech is with dysarthria but no aphasia but slight word hesitancy and nonfluent speech  Cranial Nerves:  II: PERRL.   III, IV, VI: EOMI. Eyelids elevate symmetrically.  V: Sensation is intact to light touch and symmetrical to face.  VII: Right facial droop VIII: hearing intact to voice. IX, X: Voice is mildly dysarthric XII: tongue is midline without fasciculations. Motor: 5/5 strength to left upper and lower extremities, 2-3 out of 5 strength to right upper extremity,  4 out of 5 strength to right lower extremity Tone: is normal and bulk is normal Sensation- Intact to light touch bilaterally.   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 ASPECTS 10.  CTA head & neck with CTP emergent LVO at left M1 segment with 146 cc of penumbra and no core infarct, atherosclerosis with flow reducing stenosis of right vertebral origin, 2 mm left superior cerebellar aneurysm MRI acute left MCA territory infarct involving insula left temporal lobe and corona radiata with small petechial hemorrhage in anterior left temporal lobe, additional punctate embolic infarcts in right parietal  lobe 2D Echo EF 45 to 50%, left atrium mildly dilated, right atrium mildly dilated, no atrial level shunt LDL 87 HgbA1c 5.6 VTE prophylaxis -SCDs aspirin 81 mg daily prior to admission, now on Eliquis for A-fib Therapy recommendations: CIR Disposition: Pending  Atrial fibrillation Patient was found to have new onset atrial fibrillation today Continue telemetry monitoring Continue anticoagulation with heparin Cardiology consult for new onset A-fib  Hypertension Home meds: Clonidine 0.1 mg twice daily, lisinopril 40 mg daily, losartan 100 mg daily, metoprolol 100 mg every 24 hours Stable Blood Pressure Goal: SBP 120-160 for first 24 hours then less than 180   Hyperlipidemia Home meds: Atorvastatin 40 mg daily, increased to 80 LDL 87, goal < 70 Continue statin at discharge  Dysphagia Patient has post-stroke dysphagia, SLP consulted    Diet   DIET - DYS 1 Fluid consistency: Nectar Thick   Advance diet as tolerated  Other Stroke Risk Factors Coronary artery disease   Other Active Problems None  Hospital day # 5  Patient seen and examined by NP/APP with MD. MD to update note as needed.   Elmer Picker, DNP, FNP-BC [facility] Pager: [phone]  I have personally obtained history,examined this patient, reviewed notes, independently viewed imaging studies, participated in medical decision making and plan of care.ROS completed by me personally and pertinent positives fully documented  I have made any additions or clarifications directly to the above note. Agree with note above.  Continue Eliquis.  Transfer to rehab when bed available.  [name], MD Medical Director [facility] Pager: [phone] 04/09/2023 1:40 PM   To contact Stroke Continuity provider, please refer to [email or web address]. After hours, contact General Neurology

## 2023-04-09 NOTE — Progress Notes (Signed)
Inpatient Rehabilitation Admissions Coordinator   I have received insurance approval for Cir, but no bed into first of the week. I spoke with her son, Fayrene Fearing, by phone. He has arranged for his ex wife, Marchelle Folks, to provide supervision during the day and he and his son , Apolinar Junes will provide supervision after work. I await bed availability to admit next week.  Ottie Glazier, RN, MSN Rehab Admissions Coordinator [phone] 04/09/2023 1:56 PM

## 2023-04-09 NOTE — Progress Notes (Addendum)
Occupational Therapy Treatment Patient Details Name: [name] MRN: [id number] DOB: [birth year] Today's Date: 04/09/2023   History of present illness 82 y.o. female who presented 04/04/23 with acute onset right-sided weakness, right facial droop and aphasia.  CT angiogram=  left M1 occlusion  s/p thrombectomy on 10/20. 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 PMH significant for HTN, LBBB, CAD.   OT comments  Pt. Seen for skilled OT treatment session.  Pt. Able to complete stand pivot from eob to bsc with min a, cues for sequencing and hand placement.  Short distance ambulation around bed to recliner with max for R hand placement on RW and min a for rw management.  Pt. Able to demo LB dressing seated with S.  Will cont. With acute OT POC while pt. Here.    BP after activity: 151/81-97 (P-68)       If plan is discharge home, recommend the following:  A lot of help with walking and/or transfers;A lot of help with bathing/dressing/bathroom   Equipment Recommendations  BSC/3in1;Wheelchair (measurements OT);Wheelchair cushion (measurements OT)    Recommendations for Other Services Rehab consult;Speech consult    Precautions / Restrictions Precautions Precautions: Fall Precaution Comments: SBP <180       Mobility Bed Mobility               General bed mobility comments: seated eob with rn assisting upon arrival to room    Transfers Overall transfer level: Needs assistance Equipment used: Rolling walker (2 wheels) Transfers: Sit to/from Stand, Bed to chair/wheelchair/BSC Sit to Stand: Min assist     Step pivot transfers: Min assist     General transfer comment: Min A with Max A to place RUE on the RW. Assist for balance support as pt powers up to full stand.     Balance                                            ADL either performed or assessed with clinical judgement   ADL Overall ADL's : Needs assistance/impaired                     Lower Body Dressing: Set up;Sitting/lateral leans Lower Body Dressing Details (indicate cue type and reason): able to manage B socks with figure 4 method seated in recliner Toilet Transfer: Minimal assistance;Cueing for sequencing;Stand-pivot;BSC/3in1   Toileting- Clothing Manipulation and Hygiene: Maximal assistance;Sit to/from stand       Functional mobility during ADLs: Minimal assistance;Rolling walker (2 wheels) General ADL Comments: ambulated from 3n1, around the bed to recliner    Extremity/Trunk Assessment              Vision       Perception     Praxis      Cognition Arousal: Alert Behavior During Therapy: WFL for tasks assessed/performed Overall Cognitive Status: Impaired/Different from baseline Area of Impairment: Orientation, Attention, Memory, Following commands, Safety/judgement, Awareness, Problem solving                 Orientation Level: Situation, Time, Disoriented to, Person, Place Current Attention Level: Sustained Memory: Decreased recall of precautions, Decreased short-term memory Following Commands: Follows one step commands consistently Safety/Judgement: Decreased awareness of safety, Decreased awareness of  deficits Awareness: Intellectual Problem Solving: Slow processing, Requires verbal cues General Comments: repeating same conversation throughout session.  after saying comment would pause then repeat the same comment again.  "my grandson was just here" would then list her grandchildren and then pause and start over        Exercises      Shoulder Instructions       General Comments      Pertinent Vitals/ Pain       Pain Assessment Pain Assessment: No/denies pain  Home Living                                          Prior  Functioning/Environment              Frequency  Min 1X/week        Progress Toward Goals  OT Goals(current goals can now be found in the care plan section)  Progress towards OT goals: Progressing toward goals     Plan      Co-evaluation                 AM-PAC OT "6 Clicks" Daily Activity     Outcome Measure   Help from another person eating meals?: A Little Help from another person taking care of personal grooming?: A Lot Help from another person toileting, which includes using toliet, bedpan, or urinal?: A Lot Help from another person bathing (including washing, rinsing, drying)?: A Lot Help from another person to put on and taking off regular upper body clothing?: A Lot Help from another person to put on and taking off regular lower body clothing?: A Lot 6 Click Score: 13    End of Session Equipment Utilized During Treatment: Rolling walker (2 wheels)  OT Visit Diagnosis: Unsteadiness on feet (R26.81)   Activity Tolerance Patient tolerated treatment well   Patient Left in chair;with call bell/phone within reach;with chair alarm set   Nurse Communication Other (comment) (rn reviewed seated fall previous night but states ok to work with pt, reviewed at end of sesion pt. up in chair with chair alarm on. provided status of urination/BM during toileting and provided BP post activity)        Time: 1610-9604 OT Time Calculation (min): 12 min  Charges: OT General Charges $OT Visit: 1 Visit OT Treatments $Self Care/Home Management : 8-22 mins  Boneta Lucks, COTA/L Acute Rehabilitation [phone]   Alessandra Bevels Lorraine-COTA/L 04/09/2023, 10:21 AM

## 2023-04-10 DIAGNOSIS — I63412 Cerebral infarction due to embolism of left middle cerebral artery: Secondary | ICD-10-CM

## 2023-04-10 DIAGNOSIS — I480 Paroxysmal atrial fibrillation: Secondary | ICD-10-CM | POA: Diagnosis not present

## 2023-04-10 LAB — BASIC METABOLIC PANEL
Anion gap: 9 (ref 5–15)
BUN: 13 mg/dL (ref 8–23)
CO2: 26 mmol/L (ref 22–32)
Calcium: 9.3 mg/dL (ref 8.9–10.3)
Chloride: 104 mmol/L (ref 98–111)
Creatinine, Ser: 0.86 mg/dL (ref 0.44–1.00)
GFR, Estimated: >60 mL/min (ref >60)
Glucose, Bld: 107 mg/dL — ABNORMAL HIGH (ref 70–99)
Potassium: 3.4 mmol/L — ABNORMAL LOW (ref 3.5–5.1)
Sodium: 139 mmol/L (ref 135–145)

## 2023-04-10 MED ORDER — POTASSIUM CHLORIDE 20 MEQ PO PACK
40.0000 meq | PACK | Freq: Two times a day (BID) | ORAL | Status: DC
  Administered 2023-04-10: 40 meq via ORAL
  Filled 2023-04-10: qty 2

## 2023-04-10 MED ORDER — POTASSIUM CHLORIDE 20 MEQ PO PACK
40.0000 meq | PACK | Freq: Two times a day (BID) | ORAL | Status: AC
  Administered 2023-04-10: 40 meq via ORAL
  Filled 2023-04-10: qty 2

## 2023-04-10 MED ORDER — CLONIDINE HCL 0.1 MG PO TABS
0.1000 mg | ORAL_TABLET | Freq: Two times a day (BID) | ORAL | Status: DC
  Administered 2023-04-10 – 2023-04-12 (×5): 0.1 mg via ORAL
  Filled 2023-04-10 (×5): qty 1

## 2023-04-10 NOTE — Progress Notes (Signed)
Helped patient call Leanna Battles around 8AM, Patient very confused and has been repeatedly asking about calling him every time staff is in the room. Patient is also asking staff repeatedly about how long they have been in the hospital, and how long they are staying. Staff continuing to reorient patient and remind them that they have already spoken to [place] this morning and that they will be here until Provider discharges them.

## 2023-04-10 NOTE — Progress Notes (Addendum)
STROKE TEAM PROGRESS NOTE   BRIEF HPI [name] is a 82 y.o. female with history of CAD, hypertension and left bundle branch block presenting with acute onset right-sided weakness, right facial droop and aphasia.   CT angiogram revealed left M1 occlusion, and patient was taken to interventional radiology for mechanical thrombectomy.  This was successful, with TICI 3 revascularization was achieved after 2 passes.  She was extubated postprocedure and appears to have improved but remains disoriented with some right-sided weakness.  SIGNIFICANT HOSPITAL EVENTS 10/20: Patient presented to hospital, mechanical thrombectomy performed 10/22: Patient transferred out of the ICU  INTERIM HISTORY/SUBJECTIVE On Eliquis now. No complaints. Supplement K (keep goal 4 iso afib), gave today. Added home clonidine .1mg  BID   OBJECTIVE Today's Vitals   04/10/23 0916 04/10/23 0922 04/10/23 1126 04/10/23 1542  BP: (!) 140/64  (!) 144/62 112/63  Pulse: 72  68 (!) 57  Resp:   17 17  Temp:   98.5 F (36.9 C) 98.2 F (36.8 C)  TempSrc:   Oral Oral  SpO2:   98% 99%  Weight:      Height:      PainSc:  3      Body mass index is 23.48 kg/m.  CBC    Component Value Date/Time   WBC 6.0 04/09/2023 1018   RBC 4.51 04/09/2023 1018   HGB 12.8 04/09/2023 1018   HCT 38.1 04/09/2023 1018   PLT 181 04/09/2023 1018   MCV 84.5 04/09/2023 1018   MCH 28.4 04/09/2023 1018   MCHC 33.6 04/09/2023 1018   RDW 12.8 04/09/2023 1018   LYMPHSABS 1.2 04/05/2023 0755   MONOABS 0.4 04/05/2023 0755   EOSABS 0.0 04/05/2023 0755   BASOSABS 0.0 04/05/2023 0755    BMET    Component Value Date/Time   NA 139 04/10/2023 0512   K 3.4 (L) 04/10/2023 0512   CL 104 04/10/2023 0512   CO2 26 04/10/2023 0512   GLUCOSE 107 (H) 04/10/2023 0512   BUN 13 04/10/2023 0512   CREATININE 0.86 04/10/2023 0512   CREATININE 1.02 (H) 03/20/2016 1630   CALCIUM 9.3 04/10/2023 0512   GFRNONAA >60 04/10/2023 0512    IMAGING  past 24 hours No results found.  Vitals:   04/10/23 0830 04/10/23 0916 04/10/23 1126 04/10/23 1542  BP: (!) 167/88 (!) 140/64 (!) 144/62 112/63  Pulse: 73 72 68 (!) 57  Resp: 16  17 17   Temp: (!) 97.4 F (36.3 C)  98.5 F (36.9 C) 98.2 F (36.8 C)  TempSrc: Oral  Oral Oral  SpO2: 97%  98% 99%  Weight:      Height:        PHYSICAL EXAM General:  Alert, well-nourished, well-developed pleasant elderly Caucasian lady in no acute distress Psych:  Mood and affect appropriate for situation CV: Regular rate and rhythm on monitor Respiratory:  Regular, unlabored respirations on room air GI: Abdomen soft and nontender   NEURO:  Mental Status: AA&Ox2 Speech/Language: speech is with dysarthria but no aphasia but slight word hesitancy and nonfluent speech  Cranial Nerves:  II: PERRL.   III, IV, VI: EOMI. Eyelids elevate symmetrically.  V: Sensation is intact to light touch and symmetrical to face.  VII: Right facial droop VIII: hearing intact to voice. IX, X: Voice is mildly dysarthric XII: tongue is midline without fasciculations. Motor: 5/5 strength to left upper and lower extremities, delotid 1/5, wiggles fingers, tri/bicep 3/5. RLE: 4/5 except 4-/5 hip flexion.  Tone: is  normal and bulk is normal Sensation- Intact to light touch bilaterally.   Gait- deferred  ---------------- ASSESSMENT/PLAN      Acute Ischemic Infarct:  left MCA territory infarcts with left M1 occlusion s/p IR with TICI3, etiology likely due to new diagnosis of atrial fibrillation  Code Stroke CT head No acute abnormality. ASPECTS 10.  CTA head & neck with CTP emergent LVO at left M1 segment with 146 cc of penumbra and no core infarct, atherosclerosis with flow reducing stenosis of right vertebral origin, 2 mm left superior cerebellar aneurysm MRI acute left MCA territory infarct involving insula left temporal lobe and corona radiata with small petechial hemorrhage in anterior left temporal lobe, additional  punctate embolic infarcts in right parietal lobe 2D Echo EF 45 to 50% LDL 87 HgbA1c 5.6 VTE prophylaxis - apixaban  aspirin 81 mg daily prior to admission, now on Eliquis for A-fib Therapy recommendations: CIR Disposition: Pending  Atrial fibrillation Patient was found to have new onset atrial fibrillation this admission Was anticoagulation with heparin IV, now switched to Eliquis Cardiology on board  Hypertension Home meds: Clonidine 0.1 mg twice daily, furosemide 40mg  daily, lisinopril 40 mg daily, losartan 100 mg daily, metoprolol 100 mg every 24 hours Add clonidine back today at home dose and on losartan 100mg  Hold metoprolol, heart rate 57-73 BP goal normotensive  Hyperlipidemia Home meds: Atorvastatin 40 mg daily, increased to 80 LDL 87, goal < 70 Continue statin at discharge  Dysphagia Patient has post-stroke dysphagia, SLP consulted  on dysphagia 1 and thin liquid  Encourage p.o. intake advance diet as tolerated  Other Stroke Risk Factors Coronary artery disease  Other Active Problems Hypokalemia: 3.4, sup 10/26, increase goal to >4 and mag > 2  in setting of afib  LBBB  Hospital day # 6 Called patient's son, Fayrene Fearing, and gave updates.   Sanjuana Letters, PA-C Neurology Pager # [phone]   ATTENDING NOTE: I reviewed above note and agree with the assessment and plan. Pt was seen and examined.   No family at bedside.  Patient lying in bed, awake, alert, eyes open, orientated to hospital but not to age or time. Moderate expressive aphasia, some word salad with hesitation of speech with paraphasic errors. Able to name 3/3, and able to repeat simple sentences but not complex sentences. Follows all simple commands. No gaze palsy, tracking bilaterally, however, right hemianopia, right facial droop. Tongue midline. LUE at least 4/5 and BLEs 4/5 but RUE 0-1/5. Sensation intact without neglect, left FTN intact, gait not tested.   Patient now on Eliquis twice daily.   Increased Lipitor from 40-80.  BP on the higher end, continue home Cozaar 100, add home clonidine 0.1 twice daily.  PT and OT recommend CIR.  For detailed assessment and plan, please refer to above/below as I have made changes wherever appropriate.   Marvel Plan, MD PhD Stroke Neurology 04/10/2023 8:44 PM

## 2023-04-10 NOTE — Plan of Care (Signed)
  Problem: Education: Goal: Knowledge of disease or condition will improve Outcome: Not Applicable Goal: Knowledge of secondary prevention will improve (MUST DOCUMENT ALL) Outcome: Not Applicable Goal: Knowledge of patient specific risk factors will improve Loraine Leriche N/A or DELETE if not current risk factor) Outcome: Not Applicable   Problem: Ischemic Stroke/TIA Tissue Perfusion: Goal: Complications of ischemic stroke/TIA will be minimized Outcome: Not Applicable   Problem: Coping: Goal: Will verbalize positive feelings about self Outcome: Not Applicable Goal: Will identify appropriate support needs Outcome: Not Applicable   Problem: Health Behavior/Discharge Planning: Goal: Ability to manage health-related needs will improve Outcome: Not Applicable Goal: Goals will be collaboratively established with patient/family Outcome: Not Applicable   Problem: Self-Care: Goal: Ability to participate in self-care as condition permits will improve Outcome: Not Applicable Goal: Verbalization of feelings and concerns over difficulty with self-care will improve Outcome: Not Applicable Goal: Ability to communicate needs accurately will improve Outcome: Not Applicable   Problem: Nutrition: Goal: Risk of aspiration will decrease Outcome: Not Applicable Goal: Dietary intake will improve Outcome: Not Applicable   Problem: Education: Goal: Understanding of CV disease, CV risk reduction, and recovery process will improve Outcome: Not Applicable Goal: Individualized Educational Video(s) Outcome: Not Applicable   Problem: Activity: Goal: Ability to return to baseline activity level will improve Outcome: Not Applicable   Problem: Cardiovascular: Goal: Ability to achieve and maintain adequate cardiovascular perfusion will improve Outcome: Not Applicable Goal: Vascular access site(s) Level 0-1 will be maintained Outcome: Not Applicable   Problem: Health Behavior/Discharge Planning: Goal:  Ability to safely manage health-related needs after discharge will improve Outcome: Not Applicable   Problem: Education: Goal: Knowledge of General Education information will improve Description: Including pain rating scale, medication(s)/side effects and non-pharmacologic comfort measures Outcome: Not Applicable   Problem: Health Behavior/Discharge Planning: Goal: Ability to manage health-related needs will improve Outcome: Not Applicable   Problem: Clinical Measurements: Goal: Ability to maintain clinical measurements within normal limits will improve Outcome: Not Applicable Goal: Will remain free from infection Outcome: Not Applicable Goal: Diagnostic test results will improve Outcome: Not Applicable Goal: Respiratory complications will improve Outcome: Not Applicable Goal: Cardiovascular complication will be avoided Outcome: Not Applicable   Problem: Activity: Goal: Risk for activity intolerance will decrease Outcome: Not Applicable   Problem: Nutrition: Goal: Adequate nutrition will be maintained Outcome: Not Applicable   Problem: Coping: Goal: Level of anxiety will decrease Outcome: Not Applicable   Problem: Elimination: Goal: Will not experience complications related to bowel motility Outcome: Not Applicable Goal: Will not experience complications related to urinary retention Outcome: Not Applicable   Problem: Pain Managment: Goal: General experience of comfort will improve Outcome: Not Applicable   Problem: Safety: Goal: Ability to remain free from injury will improve Outcome: Not Applicable   Problem: Skin Integrity: Goal: Risk for impaired skin integrity will decrease Outcome: Not Applicable

## 2023-04-11 DIAGNOSIS — I63412 Cerebral infarction due to embolism of left middle cerebral artery: Secondary | ICD-10-CM | POA: Diagnosis not present

## 2023-04-11 LAB — BASIC METABOLIC PANEL
Anion gap: 8 (ref 5–15)
BUN: 15 mg/dL (ref 8–23)
CO2: 25 mmol/L (ref 22–32)
Calcium: 9.1 mg/dL (ref 8.9–10.3)
Chloride: 104 mmol/L (ref 98–111)
Creatinine, Ser: 0.79 mg/dL (ref 0.44–1.00)
GFR, Estimated: >60 mL/min (ref >60)
Glucose, Bld: 113 mg/dL — ABNORMAL HIGH (ref 70–99)
Potassium: 4.3 mmol/L (ref 3.5–5.1)
Sodium: 137 mmol/L (ref 135–145)

## 2023-04-11 LAB — CBC
HCT: 34.6 % — ABNORMAL LOW (ref 36.0–46.0)
Hemoglobin: 11.5 g/dL — ABNORMAL LOW (ref 12.0–15.0)
MCH: 29 pg (ref 26.0–34.0)
MCHC: 33.2 g/dL (ref 30.0–36.0)
MCV: 87.4 fL (ref 80.0–100.0)
Platelets: 170 10*3/uL (ref 150–400)
RBC: 3.96 MIL/uL (ref 3.87–5.11)
RDW: 12.8 % (ref 11.5–15.5)
WBC: 5.3 10*3/uL (ref 4.0–10.5)
nRBC: 0 % (ref 0.0–0.2)

## 2023-04-11 LAB — MAGNESIUM: Magnesium: 2 mg/dL (ref 1.7–2.4)

## 2023-04-11 NOTE — Plan of Care (Signed)
  Problem: Education: Goal: Knowledge of secondary prevention will improve (MUST DOCUMENT ALL) 04/11/2023 1807 by Wylie Hail, RN Outcome: Not Progressing 04/11/2023 1806 by Wylie Hail, RN Outcome: Not Progressing 04/11/2023 1804 by Wylie Hail, RN Outcome: Progressing   Problem: Education: Goal: Knowledge of secondary prevention will improve (MUST DOCUMENT ALL) 04/11/2023 1807 by Wylie Hail, RN Outcome: Not Progressing 04/11/2023 1804 by Wylie Hail, RN Outcome: Progressing   Problem: Education: Goal: Knowledge of General Education information will improve Description: Including pain rating scale, medication(s)/side effects and non-pharmacologic comfort measures Outcome: Not Progressing   Problem: Health Behavior/Discharge Planning: Goal: Ability to manage health-related needs will improve Outcome: Not Progressing   Problem: Clinical Measurements: Goal: Ability to maintain clinical measurements within normal limits will improve Outcome: Not Progressing Goal: Will remain free from infection Outcome: Not Progressing Goal: Diagnostic test results will improve Outcome: Not Progressing Goal: Respiratory complications will improve Outcome: Not Progressing Goal: Cardiovascular complication will be avoided Outcome: Not Progressing   Problem: Activity: Goal: Risk for activity intolerance will decrease Outcome: Not Progressing   Problem: Nutrition: Goal: Adequate nutrition will be maintained Outcome: Not Progressing   Problem: Coping: Goal: Level of anxiety will decrease Outcome: Not Progressing   Problem: Elimination: Goal: Will not experience complications related to bowel motility Outcome: Not Progressing Goal: Will not experience complications related to urinary retention Outcome: Not Progressing   Problem: Pain Management: Goal: General experience of comfort will improve Outcome: Not Progressing   Problem: Safety: Goal: Ability to remain free  from injury will improve Outcome: Not Progressing   Problem: Skin Integrity: Goal: Risk for impaired skin integrity will decrease Outcome: Not Progressing   Problem: Education: Goal: Knowledge of disease or condition will improve Outcome: Not Progressing Goal: Knowledge of secondary prevention will improve (MUST DOCUMENT ALL) Outcome: Not Progressing

## 2023-04-11 NOTE — Progress Notes (Signed)
Patient HR dropping to the 40s when sleeping and up into the upper 50s when awake, on-call notified, no new order. We continue to monitor.

## 2023-04-11 NOTE — Progress Notes (Signed)
STROKE TEAM PROGRESS NOTE   BRIEF HPI [name] is a 82 y.o. female with history of CAD, hypertension and left bundle branch block presenting with acute onset right-sided weakness, right facial droop and aphasia.   CT angiogram revealed left M1 occlusion, and patient was taken to interventional radiology for mechanical thrombectomy.  This was successful, with TICI 3 revascularization was achieved after 2 passes.  She was extubated postprocedure and appears to have improved but remains disoriented with some right-sided weakness.  SIGNIFICANT HOSPITAL EVENTS 10/20: Patient presented to hospital, mechanical thrombectomy performed 10/22: Patient transferred out of the ICU  INTERIM HISTORY/SUBJECTIVE Son is at the bedside. Pt lying in bed, awake alert and speech has some improved but still has partial aphasia. Waiting for CIR placement.   OBJECTIVE Today's Vitals   04/11/23 0424 04/11/23 0832 04/11/23 1000 04/11/23 1127  BP: (!) 116/59 (!) 125/52    Pulse:      Resp: 18 (!) 24    Temp: 97.9 F (36.6 C) 97.9 F (36.6 C)    TempSrc: Oral Oral    SpO2: 100% 97%    Weight:      Height:      PainSc:   4  Asleep   Body mass index is 23.48 kg/m.  CBC    Component Value Date/Time   WBC 5.3 04/11/2023 0558   RBC 3.96 04/11/2023 0558   HGB 11.5 (L) 04/11/2023 0558   HCT 34.6 (L) 04/11/2023 0558   PLT 170 04/11/2023 0558   MCV 87.4 04/11/2023 0558   MCH 29.0 04/11/2023 0558   MCHC 33.2 04/11/2023 0558   RDW 12.8 04/11/2023 0558   LYMPHSABS 1.2 04/05/2023 0755   MONOABS 0.4 04/05/2023 0755   EOSABS 0.0 04/05/2023 0755   BASOSABS 0.0 04/05/2023 0755    BMET    Component Value Date/Time   NA 137 04/11/2023 0558   K 4.3 04/11/2023 0558   CL 104 04/11/2023 0558   CO2 25 04/11/2023 0558   GLUCOSE 113 (H) 04/11/2023 0558   BUN 15 04/11/2023 0558   CREATININE 0.79 04/11/2023 0558   CREATININE 1.02 (H) 03/20/2016 1630   CALCIUM 9.1 04/11/2023 0558   GFRNONAA >60  04/11/2023 0558    IMAGING past 24 hours No results found.  Vitals:   04/10/23 1959 04/11/23 0006 04/11/23 0424 04/11/23 0832  BP: (!) 141/71 129/60 (!) 116/59 (!) 125/52  Pulse: (!) 59 (!) 58    Resp: 17 18 18  (!) 24  Temp: 98.3 F (36.8 C) 97.6 F (36.4 C) 97.9 F (36.6 C) 97.9 F (36.6 C)  TempSrc: Oral  Oral Oral  SpO2: 99% 100% 100% 97%  Weight:      Height:        PHYSICAL EXAM General:  Alert, well-nourished, well-developed pleasant elderly Caucasian lady in no acute distress Psych:  Mood and affect appropriate for situation CV: Regular rate and rhythm on monitor Respiratory:  Regular, unlabored respirations on room air GI: Abdomen soft and nontender   NEURO:  awake, alert, eyes open, orientated to hospital and people but not to age or time. Moderate expressive aphasia, some word salad with hesitation of speech with paraphasic errors. Able to name 3/3, and able to repeat simple sentences but not complex sentences. Follows all simple commands. No gaze palsy, tracking bilaterally, however, right hemianopia, right facial droop. Tongue midline. LUE at least 4/5 and BLEs 4/5 but RUE 1-2/5 on bicep. Sensation intact without neglect, left FTN intact, gait not tested.     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 ASPECTS 10.  CTA head & neck with CTP emergent LVO at left M1 segment with 146 cc of penumbra and no core infarct, atherosclerosis with flow reducing stenosis of right vertebral origin, 2 mm left superior cerebellar aneurysm MRI acute left MCA territory infarct involving insula left temporal lobe and corona radiata with small petechial hemorrhage in anterior left temporal lobe, additional punctate embolic infarcts in right parietal lobe 2D Echo EF 45 to 50% LDL 87 HgbA1c  5.6 VTE prophylaxis - apixaban  aspirin 81 mg daily prior to admission, now on Eliquis for A-fib Therapy recommendations: CIR Disposition: Pending  Atrial fibrillation Patient was found to have new onset atrial fibrillation this admission Was anticoagulation with heparin IV, now switched to Eliquis Cardiology on board  Hypertension Home meds: Clonidine 0.1 mg twice daily, furosemide 40mg  daily, lisinopril 40 mg daily, losartan 100 mg daily, metoprolol 100 mg every 24 hours Add clonidine back today at home dose and on losartan 100mg  Hold metoprolol, due to bradycardia BP stable now BP goal normotensive  Hyperlipidemia Home meds: Atorvastatin 40 mg daily, increased to 80 LDL 87, goal < 70 Continue statin at discharge  Dysphagia Patient has post-stroke dysphagia, SLP consulted  on dysphagia 1 and thin liquid  Encourage p.o. intake advance diet as tolerated  Other Stroke Risk Factors Coronary artery disease  Other Active Problems Hypokalemia: 3.4->supplement->4.3 LBBB  Hospital day # 7  I had long discussion with son at bedside, updated pt current condition, treatment plan and potential prognosis, and answered all the questions. He expressed understanding and appreciation.    Marvel Plan, MD PhD Stroke Neurology 04/11/2023 12:25 PM

## 2023-04-12 ENCOUNTER — Other Ambulatory Visit: Payer: Self-pay

## 2023-04-12 ENCOUNTER — Inpatient Hospital Stay (HOSPITAL_COMMUNITY)
Admission: AD | Admit: 2023-04-12 | Discharge: 2023-04-21 | DRG: 057 | Disposition: A | Discharge status: Home Health | Payer: Medicare Other | Source: Intra-hospital | Attending: Physical Medicine and Rehabilitation | Admitting: Physical Medicine and Rehabilitation

## 2023-04-12 DIAGNOSIS — Z7901 Long term (current) use of anticoagulants: Secondary | ICD-10-CM

## 2023-04-12 DIAGNOSIS — R7401 Elevation of levels of liver transaminase levels: Secondary | ICD-10-CM | POA: Diagnosis present

## 2023-04-12 DIAGNOSIS — G4700 Insomnia, unspecified: Secondary | ICD-10-CM | POA: Diagnosis present

## 2023-04-12 DIAGNOSIS — I4891 Unspecified atrial fibrillation: Secondary | ICD-10-CM | POA: Diagnosis present

## 2023-04-12 DIAGNOSIS — E785 Hyperlipidemia, unspecified: Secondary | ICD-10-CM | POA: Diagnosis present

## 2023-04-12 DIAGNOSIS — Z880 Allergy status to penicillin: Secondary | ICD-10-CM

## 2023-04-12 DIAGNOSIS — M545 Low back pain, unspecified: Secondary | ICD-10-CM | POA: Diagnosis present

## 2023-04-12 DIAGNOSIS — I63512 Cerebral infarction due to unspecified occlusion or stenosis of left middle cerebral artery: Principal | ICD-10-CM | POA: Diagnosis present

## 2023-04-12 DIAGNOSIS — I63412 Cerebral infarction due to embolism of left middle cerebral artery: Secondary | ICD-10-CM | POA: Diagnosis not present

## 2023-04-12 DIAGNOSIS — N390 Urinary tract infection, site not specified: Secondary | ICD-10-CM | POA: Diagnosis present

## 2023-04-12 DIAGNOSIS — I447 Left bundle-branch block, unspecified: Secondary | ICD-10-CM | POA: Diagnosis present

## 2023-04-12 DIAGNOSIS — Z809 Family history of malignant neoplasm, unspecified: Secondary | ICD-10-CM

## 2023-04-12 DIAGNOSIS — I959 Hypotension, unspecified: Secondary | ICD-10-CM | POA: Diagnosis present

## 2023-04-12 DIAGNOSIS — R001 Bradycardia, unspecified: Secondary | ICD-10-CM | POA: Diagnosis present

## 2023-04-12 DIAGNOSIS — I10 Essential (primary) hypertension: Secondary | ICD-10-CM | POA: Diagnosis present

## 2023-04-12 DIAGNOSIS — Z885 Allergy status to narcotic agent status: Secondary | ICD-10-CM

## 2023-04-12 DIAGNOSIS — M21331 Wrist drop, right wrist: Secondary | ICD-10-CM | POA: Diagnosis present

## 2023-04-12 DIAGNOSIS — R4189 Other symptoms and signs involving cognitive functions and awareness: Secondary | ICD-10-CM | POA: Diagnosis present

## 2023-04-12 DIAGNOSIS — R739 Hyperglycemia, unspecified: Secondary | ICD-10-CM | POA: Diagnosis present

## 2023-04-12 DIAGNOSIS — R4701 Aphasia: Secondary | ICD-10-CM | POA: Diagnosis present

## 2023-04-12 DIAGNOSIS — Z79899 Other long term (current) drug therapy: Secondary | ICD-10-CM

## 2023-04-12 DIAGNOSIS — Z8249 Family history of ischemic heart disease and other diseases of the circulatory system: Secondary | ICD-10-CM

## 2023-04-12 DIAGNOSIS — Z7982 Long term (current) use of aspirin: Secondary | ICD-10-CM

## 2023-04-12 DIAGNOSIS — R1310 Dysphagia, unspecified: Secondary | ICD-10-CM | POA: Diagnosis present

## 2023-04-12 DIAGNOSIS — I2510 Atherosclerotic heart disease of native coronary artery without angina pectoris: Secondary | ICD-10-CM | POA: Diagnosis present

## 2023-04-12 DIAGNOSIS — I69351 Hemiplegia and hemiparesis following cerebral infarction affecting right dominant side: Principal | ICD-10-CM

## 2023-04-12 DIAGNOSIS — I480 Paroxysmal atrial fibrillation: Secondary | ICD-10-CM | POA: Diagnosis not present

## 2023-04-12 DIAGNOSIS — E559 Vitamin D deficiency, unspecified: Secondary | ICD-10-CM | POA: Diagnosis present

## 2023-04-12 DIAGNOSIS — I69331 Monoplegia of upper limb following cerebral infarction affecting right dominant side: Secondary | ICD-10-CM

## 2023-04-12 DIAGNOSIS — E871 Hypo-osmolality and hyponatremia: Secondary | ICD-10-CM | POA: Diagnosis present

## 2023-04-12 LAB — URINALYSIS, ROUTINE W REFLEX MICROSCOPIC
Bilirubin Urine: NEGATIVE
Glucose, UA: NEGATIVE mg/dL
Ketones, ur: NEGATIVE mg/dL
Nitrite: NEGATIVE
Protein, ur: NEGATIVE mg/dL
Specific Gravity, Urine: 1.01 (ref 1.005–1.030)
pH: 6 (ref 5.0–8.0)

## 2023-04-12 MED ORDER — ENSURE ENLIVE PO LIQD: 237.0000 mL | Freq: Two times a day (BID) | ORAL | Status: DC

## 2023-04-12 MED ORDER — APIXABAN 5 MG PO TABS: 5.0000 mg | ORAL_TABLET | Freq: Two times a day (BID) | ORAL | Status: DC

## 2023-04-12 MED ORDER — CLONIDINE HCL 0.1 MG PO TABS
0.1000 mg | ORAL_TABLET | Freq: Two times a day (BID) | ORAL | Status: DC
  Administered 2023-04-12 – 2023-04-19 (×14): 0.1 mg via ORAL
  Filled 2023-04-12 (×14): qty 1

## 2023-04-12 MED ORDER — ACETAMINOPHEN 160 MG/5ML PO SOLN: 650.0000 mg | ORAL | Status: DC | PRN

## 2023-04-12 MED ORDER — ATORVASTATIN CALCIUM 80 MG PO TABS
80.0000 mg | ORAL_TABLET | Freq: Every day | ORAL | Status: DC
  Administered 2023-04-13 – 2023-04-21 (×9): 80 mg via ORAL
  Filled 2023-04-12 (×9): qty 1

## 2023-04-12 MED ORDER — APIXABAN 5 MG PO TABS
5.0000 mg | ORAL_TABLET | Freq: Two times a day (BID) | ORAL | Status: DC
  Administered 2023-04-12 – 2023-04-21 (×18): 5 mg via ORAL
  Filled 2023-04-12 (×18): qty 1

## 2023-04-12 MED ORDER — ATORVASTATIN CALCIUM 80 MG PO TABS: 80.0000 mg | ORAL_TABLET | Freq: Every day | ORAL | Status: DC

## 2023-04-12 MED ORDER — LOSARTAN POTASSIUM 50 MG PO TABS
100.0000 mg | ORAL_TABLET | Freq: Every day | ORAL | Status: DC
  Administered 2023-04-13 – 2023-04-14 (×2): 100 mg via ORAL
  Filled 2023-04-12 (×2): qty 2

## 2023-04-12 MED ORDER — ACETAMINOPHEN 650 MG RE SUPP: 650.0000 mg | RECTAL | Status: DC | PRN

## 2023-04-12 MED ORDER — ACETAMINOPHEN 325 MG PO TABS
650.0000 mg | ORAL_TABLET | ORAL | Status: DC | PRN
  Administered 2023-04-12: 650 mg via ORAL
  Filled 2023-04-12: qty 2

## 2023-04-12 MED ORDER — SENNOSIDES-DOCUSATE SODIUM 8.6-50 MG PO TABS
1.0000 | ORAL_TABLET | Freq: Every evening | ORAL | Status: DC | PRN
  Administered 2023-04-18: 1 via ORAL
  Filled 2023-04-12: qty 1

## 2023-04-12 NOTE — Progress Notes (Signed)
**Note Brianna-Identified via Obfuscation** PMR Admission Coordinator Pre-Admission Assessment   Patient: [name] is an 82 y.o., female MRN: [id number] DOB: July 15, 1940 Height: 5\' 5"  (165.1 cm) Weight: 64 kgyes                                                                                                                                                  Insurance Information HMO:     PPO:      PCP:      IPA:      80/20:      OTHER:  PRIMARY: [employer] medicare      Policy#: [id number]      Subscriber: pt CM Name: [name] with [facility]      Phone#: [phone] option 7      Fax#: [phone] Pre-Cert#: [id number] approved for 7 days from admit, until 04/18/23      Employer:  Benefits:  Phone #: [phone]     Name: 10/23 Eff. Date: 06/15/22     Deduct: none      Out of Pocket Max: $3600      Life Max: none  CIR: $295 co pay per day days 1 until 5      SNF: no co pay per day days 1 until 20; $203 co pay per day days 21 until 100 Outpatient: $20 per visit     Co-Pay:  Home Health: 100%      Co-Pay: per medical neccesity DME: 80%     Co-Pay: 20% Providers: in network  SECONDARY: none   Financial Counselor:       Phone#:    The Data processing manager" for patients in Inpatient Rehabilitation Facilities with attached "Privacy Act Statement-Health Care Records" was provided and verbally reviewed with: Patient and Family   Emergency Contact Information Contact Information       Name Relation Home Work Mobile    [name] [phone]        [name] [phone]             Other Contacts       Name Relation Home Work Mobile    [name] Other     [phone]         Current Medical History  Patient Admitting Diagnosis: CVA   History of Present Illness: 82 year old female with history of chest pain, CAD, HTN, Left bundle branch block, who presented on 04/04/23 with right sided weakness to [facility].    Tele stroke Neurology seen and TNK was given. CT angio of head and  neck revealed M1 occlusion, Transferred to [facility] 04[facility]. Taken to IR with successful mechanical thrombectomy with TICI 3 revascularization after 2 passes. MRI acute left MCA territory infarct involving insula left temporal lobe and corona radiata with small petechial hemorrhage in anterior left temporal lobe, additional punctate embolic infarcts in  right parietal lobe . 2 d echo EF 45 to 50%, left atrium midly dilated, right atrium midly dilated, no atrial level shunt. Found to have new onset atrial fibrillation. Cardiology consulted. On heparin gtt. Recommend no beta blocker due to slow ventricular response. No symptoms of heart failure. Continue losartan and would add aldactone if BP remains elevated.  Home meds for HTN of clonidine, lisinopril and losartan, as well as metoprolol. Hom meds atorvastatin with LDL 87. Seen by SLP with dysphagia 1 diet with nectar thick liquids.    Complete NIHSS TOTAL: 11 Glasgow Coma Scale Score: 14   Patient'Lawrence medical record from [facility] and [facility] has been reviewed by the rehabilitation admission coordinator and physician.   Past Medical History      Past Medical History:  Diagnosis Date   Chest pain     Coronary artery disease     Hypertension     LBBB (left bundle branch block)          Has the patient had major surgery during 100 days prior to admission? Yes   Family History  family history includes Cancer in her mother; Heart attack in her father.   Current Medications   Current Medications    Current Facility-Administered Medications:    acetaminophen (TYLENOL) tablet 650 mg, 650 mg, Oral, Q4H PRN, 650 mg at 04/09/23 1002 **OR** acetaminophen (TYLENOL) 160 MG/5ML solution 650 mg, 650 mg, Per Tube, Q4H PRN **OR** acetaminophen (TYLENOL) suppository 650 mg, 650 mg, Rectal, Q4H PRN, Brianna Argyle, NP   apixaban (ELIQUIS) tablet 5 mg, 5 mg, Oral, BID, [name], Devon, NP, 5 mg at 04/09/23 0948   atorvastatin (LIPITOR)  tablet 80 mg, 80 mg, Oral, Daily, [name], Brianna S, MD, 80 mg at 04/09/23 7829   hydrALAZINE (APRESOLINE) injection 10 mg, 10 mg, Intravenous, Q4H PRN, [name], Brianna E, NP, 10 mg at 04/09/23 1009   losartan (COZAAR) tablet 100 mg, 100 mg, Oral, Daily, [name], Brianna E, NP, 100 mg at 04/09/23 5621   Oral care mouth rinse, 15 mL, Mouth Rinse, PRN, Brianna Riley, MD   Oral care mouth rinse, 15 mL, Mouth Rinse, 4 times per day, Brianna Blinks, MD, 15 mL at 04/09/23 1204   Oral care mouth rinse, 15 mL, Mouth Rinse, PRN, Brianna Blinks, MD   senna-docusate (Senokot-Lawrence) tablet 1 tablet, 1 tablet, Oral, QHS PRN, Brianna Argyle, NP     Patients Current Diet:  Diet Order                  DIET - DYS 1 Fluid consistency: Nectar Thick  Diet effective now                       Precautions / Restrictions Precautions Precautions: Fall Precaution Comments: SBP <180 Restrictions Weight Bearing Restrictions: No    Has the patient had 2 or more falls or a fall with injury in the past year?No   Prior Activity Level Community (5-7x/wk): Independent, driving, caring for niece 81 years old daily   Prior Functional Level Prior Function Prior Level of Function : Independent/Modified Independent, Driving Mobility Comments: no use of AD ADLs Comments: does all the home making and cooking. waas taking care of her neice during the week   Self Care: Did the patient need help bathing, dressing, using the toilet or eating?  Independent   Indoor Mobility: Did the patient need assistance with walking from room to room (  with or without device)? Independent   Stairs: Did the patient need assistance with internal or external stairs (with or without device)? Independent   Functional Cognition: Did the patient need help planning regular tasks such as shopping or remembering to take medications? Independent   Patient Information Are you of Hispanic, Latino/a,or Spanish origin?: A. No,  not of Hispanic, Latino/a, or Spanish origin What is your race?: A. White Do you need or want an interpreter to communicate with a doctor or health care staff?: 0. No   Patient'Lawrence Response To:  Health Literacy and Transportation Is the patient able to respond to health literacy and transportation needs?: Yes Health Literacy - How often do you need to have someone help you when you read instructions, pamphlets, or other written material from your doctor or pharmacy?: Never In the past 12 months, has lack of transportation kept you from medical appointments or from getting medications?: No In the past 12 months, has lack of transportation kept you from meetings, work, or from getting things needed for daily living?: No   Home Assistive Devices / Equipment Home Equipment: None   Prior Device Use: Indicate devices/aids used by the patient prior to current illness, exacerbation or injury? None of the above   Current Functional Level Cognition   Arousal/Alertness: Awake/alert Overall Cognitive Status: Impaired/Different from baseline Current Attention Level: Sustained Orientation Level: Oriented to person, Disoriented to time, Disoriented to situation, Disoriented to place Following Commands: Follows one step commands consistently Safety/Judgement: Decreased awareness of safety, Decreased awareness of deficits General Comments: repeating same conversation throughout session.  after saying comment would pause then repeat the same comment again.  "my grandson was just here" would then list her grandchildren and then pause and start over Attention: Focused, Sustained Focused Attention: Appears intact Sustained Attention: Impaired Sustained Attention Impairment: Verbal basic Memory: Impaired Memory Impairment: Decreased recall of new information, Storage deficit, Retrieval deficit Awareness: Impaired Awareness Impairment: Intellectual impairment, Emergent impairment Problem Solving:  Impaired Problem Solving Impairment: Verbal basic, Functional basic Executive Function: Reasoning, Sequencing, Self Monitoring, Self Correcting Behaviors: Perseveration    Extremity Assessment (includes Sensation/Coordination)   Upper Extremity Assessment: Right hand dominant, RUE deficits/detail RUE Deficits / Details: activation of the shoulder, activation of shoulder flexion, shoulder abduction, digit flexion. pt lacks digit extension, wrist extension and elbow flexion. OT to watch for splinting needs and hold on splinting at this time as pt was activating R UE some RUE Sensation: decreased light touch, decreased proprioception RUE Coordination: decreased fine motor, decreased gross motor  Lower Extremity Assessment: Defer to PT evaluation RLE Deficits / Details: 3/5 knee ext/flex; 3+/5 Df, 3-/5 PF, 3-/5 hip flex or less RLE Coordination: decreased fine motor, decreased gross motor LLE Deficits / Details: grossly 3+/5 throughout or better LLE Coordination: decreased gross motor     ADLs   Overall ADL'Lawrence : Needs assistance/impaired Eating/Feeding: Minimal assistance Eating/Feeding Details (indicate cue type and reason): needs cues for R side pocketing of liquids during session. drinking from cup with straw. Lawrence present and educated on use of suction if needed Grooming: Wash/dry face, Minimal assistance Grooming Details (indicate cue type and reason): using L UE Upper Body Bathing: Moderate assistance Lower Body Bathing: Maximal assistance Upper Body Dressing : Moderate assistance Lower Body Dressing: Set up, Sitting/lateral leans Lower Body Dressing Details (indicate cue type and reason): able to manage B socks with figure 4 method seated in recliner Toilet Transfer: Minimal assistance, Cueing for sequencing, Stand-pivot, BSC/3in1 Toileting- Clothing Manipulation and Hygiene:  Maximal assistance, Sit to/from stand Functional mobility during ADLs: Minimal assistance, Rolling walker (2  wheels) General ADL Comments: ambulated from 3n1, around the bed to recliner     Mobility   Overal bed mobility: Needs Assistance Bed Mobility: Supine to Sit Supine to sit: Contact guard General bed mobility comments: seated eob with rn assisting upon arrival to room     Transfers   Overall transfer level: Needs assistance Equipment used: Rolling walker (2 wheels) Transfers: Sit to/from Stand, Bed to chair/wheelchair/BSC Sit to Stand: Min assist Bed to/from chair/wheelchair/BSC transfer type:: Step pivot Step pivot transfers: Min assist General transfer comment: Min A with Max A to place RUE on the RW. Assist for balance support as pt powers up to full stand.     Ambulation / Gait / Stairs / Wheelchair Mobility   Ambulation/Gait Ambulation/Gait assistance: Editor, commissioning (Feet): 175 Feet Assistive device: Rolling walker (2 wheels) Gait Pattern/deviations: Step-through pattern, Decreased step length - left, Drifts right/left General Gait Details: Min A for R sided obstacles pt runs AD into obstacles and requires verbal cues to scan for objects on the R. Consistent assist to steer walker as she veers R with it while looking L. Assist to keep R hand on walker throughout. Gait velocity: Decreased Gait velocity interpretation: <1.31 ft/sec, indicative of household ambulator     Posture / Balance Dynamic Sitting Balance Sitting balance - Comments: CGA at EOB. Balance Overall balance assessment: Needs assistance Sitting-balance support: Single extremity supported, Feet supported Sitting balance-Leahy Scale: Fair Sitting balance - Comments: CGA at EOB. Postural control: Right lateral lean Standing balance support: Bilateral upper extremity supported, During functional activity Standing balance-Leahy Scale: Poor Standing balance comment: R LOB poor safety awareness     Special needs/care consideration      Previous Home Environment  Living Arrangements:  (lives with Lawrence,  [name] and grandson , [name])  Lives With: Lawrence Available Help at Discharge: Family, Available 24 hours/day [name] will work out 24/7 supervision) Type of Home: [employer] Layout: One level Home Access: Stairs to enter Entrance Stairs-Rails: None Secretary/administrator of Steps: 3 Bathroom Shower/Tub: Engineer, manufacturing systems: Pharmacist, community: Yes Home Care Services: No Additional Comments: wears glasses for reading   Discharge Living Setting Plans for Discharge Living Setting: Patient'Lawrence home, Lives with (comment), House (lives with [name], Lawrence and grandson, [name]) Type of Home at Discharge: House Discharge Home Layout: One level Discharge Home Access: Stairs to enter Entrance Stairs-Rails: None Entrance Stairs-Number of Steps: 3 Discharge Bathroom Shower/Tub: Tub/shower unit Discharge Bathroom Toilet: Standard Discharge Bathroom Accessibility: Yes How Accessible: Accessible via walker Does the patient have any problems obtaining your medications?: No   Social/Family/Support Systems Patient Roles: Caregiver, Parent Contact Information: Lawrence, [name] Anticipated Caregiver: [name] and [name] with additional family members Anticipated Caregiver'Lawrence Contact Information: see contacts Ability/Limitations of Caregiver: family work , but will work out schedule Caregiver Availability: 24/7 Discharge Plan Discussed with Primary Caregiver: Yes Is Caregiver In Agreement with Plan?: Yes Does Caregiver/Family have Issues with Lodging/Transportation while Pt is in Rehab?: No   Marchelle Folks, [name] will be there during the day and [name] and [name] after work to provide [employer] Goals Patient/Family Goal for Rehab: supervision with PT, OT and SLP Expected length of stay: ELOS 10 to 14 days Pt/Family Agrees to Admission and willing to participate: Yes Program Orientation Provided & Reviewed with Pt/Caregiver Including Roles  & Responsibilities: Yes   Decrease burden  of Care through IP rehab  admission: n/a   Possible need for SNF placement upon discharge:not anticipated   Patient Condition: This patient'Lawrence medical and functional status has changed since the consult dated: 04/07/23 in which the Rehabilitation Physician determined and documented that the patient'Lawrence condition is appropriate for intensive rehabilitative care in an inpatient rehabilitation facility. See "History of Present Illness" (above) for medical update. Functional changes are: Patient is requiring min A for ambulation with RW and min A for ADLs. Patient also has aphasia. Patient'Lawrence medical and functional status update has been discussed with the Rehabilitation physician and patient remains appropriate for inpatient rehabilitation. Will admit to inpatient rehab today.   Preadmission Screen Completed By:  Clois Dupes, RN, MSN 04/09/2023 3:23 PM ______________________________________________________________________   Discussed status with Dr. Riley Kill on 04/12/23 at 9:30 am and received approval for admission today.   Admission Coordinator:  Clois Dupes, RN MSN, updates provided by Lissa Merlin, PT time 10:00 am Dorna Bloom 04/12/2023

## 2023-04-12 NOTE — Discharge Summary (Addendum)
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, etiology likely due to new diagnosis of atrial fibrillation   Active Problems: AF HTN HLD CAD LBBB   Medications to be continued on Rehab Allergies as of 04/12/2023       Reactions   Codeine Nausea And Vomiting   Penicillins Nausea And Vomiting        Medication List     STOP taking these medications    aspirin EC 81 MG tablet   furosemide 40 MG tablet Commonly known as: LASIX   HYDROcodone-acetaminophen 5-325 MG tablet Commonly known as: NORCO/VICODIN   lisinopril 40 MG tablet Commonly known as: ZESTRIL   metoprolol succinate 100 MG 24 hr tablet Commonly known as: TOPROL-XL       TAKE these medications    acetaminophen 500 MG tablet Commonly known as: TYLENOL Take 500 mg by mouth every 6 (six) hours as needed for mild pain.   apixaban 5 MG Tabs tablet Commonly known as: ELIQUIS Take 1 tablet (5 mg total) by mouth 2 (two) times daily.   atorvastatin 80 MG tablet Commonly known as: LIPITOR Take 1 tablet (80 mg total) by mouth daily. Start taking on: April 13, 2023 What changed:  medication strength how much to take   cloNIDine 0.1 MG tablet Commonly known as: CATAPRES Take 1 tablet (0.1 mg total) by mouth 2 (two) times daily.   D-3-5 125 MCG (5000 UT) capsule Generic drug: Cholecalciferol Take 5,000 Units by mouth daily.   losartan 100 MG tablet Commonly known as: COZAAR Take 100 mg by mouth daily.        LABORATORY STUDIES CBC    Component Value Date/Time   WBC 5.3 04/11/2023 0558   RBC 3.96 04/11/2023 0558   HGB 11.5 (L) 04/11/2023 0558   HCT 34.6 (L) 04/11/2023 0558   PLT 170 04/11/2023 0558   MCV  87.4 04/11/2023 0558   MCH 29.0 04/11/2023 0558   MCHC 33.2 04/11/2023 0558   RDW 12.8 04/11/2023 0558   LYMPHSABS 1.2 04/05/2023 0755   MONOABS 0.4 04/05/2023 0755   EOSABS 0.0 04/05/2023 0755   BASOSABS 0.0 04/05/2023 0755   CMP    Component Value Date/Time   NA 137 04/11/2023 0558   K 4.3 04/11/2023 0558   CL 104 04/11/2023 0558   CO2 25 04/11/2023 0558   GLUCOSE 113 (H) 04/11/2023 0558   BUN 15 04/11/2023 0558   CREATININE 0.79 04/11/2023 0558   CREATININE 1.02 (H) 03/20/2016 1630   CALCIUM 9.1 04/11/2023 0558   PROT 7.2 04/04/2023 1005   ALBUMIN 4.0 04/04/2023 1005   AST 16 04/04/2023 1005   ALT 12 04/04/2023 1005   ALKPHOS 50 04/04/2023 1005   BILITOT 0.7 04/04/2023 1005   GFRNONAA >60 04/11/2023 0558   GFRAA >60 02/18/2016 1709   COAGS Lab Results  Component Value Date   INR 1.1 04/04/2023   INR 1.0 03/20/2016   INR 0.9 RATIO 02/08/2007   Lipid Panel    Component Value Date/Time   CHOL 143 04/05/2023 0755   TRIG 49 04/05/2023 0755   HDL 46 04/05/2023 0755   CHOLHDL 3.1 04/05/2023 0755   VLDL 10 04/05/2023  0755   LDLCALC 87 04/05/2023 0755   HgbA1C  Lab Results  Component Value Date   HGBA1C 5.6 04/04/2023   Urinalysis    Component Value Date/Time   COLORURINE STRAW (A) 04/04/2023 1122   APPEARANCEUR CLEAR 04/04/2023 1122   LABSPEC 1.020 04/04/2023 1122   PHURINE 7.0 04/04/2023 1122   GLUCOSEU NEGATIVE 04/04/2023 1122   HGBUR SMALL (A) 04/04/2023 1122   BILIRUBINUR NEGATIVE 04/04/2023 1122   KETONESUR NEGATIVE 04/04/2023 1122   PROTEINUR NEGATIVE 04/04/2023 1122   UROBILINOGEN 0.2 04/21/2010 0921   NITRITE NEGATIVE 04/04/2023 1122   LEUKOCYTESUR NEGATIVE 04/04/2023 1122   Urine Drug Screen     Component Value Date/Time   LABOPIA NONE DETECTED 04/04/2023 1122   COCAINSCRNUR NONE DETECTED 04/04/2023 1122   LABBENZ NONE DETECTED 04/04/2023 1122   AMPHETMU NONE DETECTED 04/04/2023 1122   THCU NONE DETECTED 04/04/2023 1122   LABBARB NONE  DETECTED 04/04/2023 1122    Alcohol Level    Component Value Date/Time   ETH <10 04/04/2023 1005     SIGNIFICANT DIAGNOSTIC STUDIES IR PERCUTANEOUS ART THROMBECTOMY/INFUSION INTRACRANIAL INC DIAG ANGIO  Result Date: 04/12/2023 INDICATION: New onset left gaze deviation, right-sided neglect and right hemiparesis. EXAM: 1. EMERGENT LARGE VESSEL OCCLUSION THROMBOLYSIS (anterior CIRCULATION) COMPARISON:  CT angiogram of the head and neck of April 04, 2023. MEDICATIONS: Vancomycin 1 g IV antibiotic was administered within 1 hour of the procedure. ANESTHESIA/SEDATION: General anesthesia. CONTRAST:  Omnipaque 300 approximately 75 mL. FLUOROSCOPY TIME:  Fluoroscopy Time: 50 minutes 6 seconds (1123 mGy). COMPLICATIONS: None immediate. TECHNIQUE: Following a full explanation of the procedure along with the potential associated complications, an informed witnessed consent was obtained. The risks of intracranial hemorrhage of 10%, worsening neurological deficit, ventilator dependency, death and inability to revascularize were all reviewed in detail with the patient's son. The patient was then put under general anesthesia by the Department of Anesthesiology at [facility]. The right groin was prepped and draped in the usual sterile fashion. Thereafter using modified Seldinger technique, transfemoral access into the right common femoral artery was obtained without difficulty. Over an 0.035 inch guidewire an 8 French 25 cm Pinnacle sheath was inserted. Through this, and also over an 0.035 inch guidewire a combination of a 125 cm Simmons 2 6 [place] support catheter inside of an 087 95 cm balloon guide catheter was advanced to the aortic arch region, and selectively positioned in the proximal left internal carotid artery. The guidewire, and the support catheter were removed. Good aspiration was obtained from the hub of the balloon guide catheter. A gentle control arteriogram was then performed centered extra  cranially and intracranially. FINDINGS: The left common carotid arteriogram demonstrates the left external carotid artery and its major branches to be widely patent. The left internal carotid artery at the bulb to the cranial skull base is widely patent. The petrous, the cavernous and the supraclinoid left ICA are widely patent. Left anterior cerebral artery demonstrates approximately 50% stenosis at its origin. More distally, the vessel opacifies normally into the capillary and venous phases. The left middle cerebral artery demonstrates complete occlusion distal to the origin of the anterior temporal branch. PROCEDURE: Through the balloon guide catheter in the proximal 1/3 of the left ICA, a combination of an 071 132 cm Zoom aspiration catheter with a Phenom microcatheter was advanced over an 018 inch micro guidewire with a moderate J configuration to the supraclinoid left ICA. The micro guidewire was then gently advanced through the occluded segment into the  inferior division of the left MCA distal M2 region followed by the microcatheter the guidewire was removed. Good aspiration was obtained from the hub of the microcatheter. A 4 mm x 40 mm Solitaire X retrieval device was then advanced to the distal end of the microcatheter and deployed in the usual manner such that the proximal portion of the device was proximal to the occluded MCA. The Zoom aspiration catheter was advanced and engaged into the proximal portion of the occlusion of the left ICA. Constant aspiration was performed at the hub of the 071 Zoom aspiration catheter for approximately 2 minutes. Proximal flow arrest in the left internal carotid artery, constant aspiration at the hub of the balloon guide catheter, the combination of the retrieval device, the microcatheter, and the Zoom aspiration catheter was retrieved and removed. A control arteriogram performed following reversal of flow arrest demonstrated no significant change in the occluded left  MCA. A second pass was then made again using the above microcatheter and aspiration advanced over an 018 inch micro guidewire to the proximal left MCA. The guidewire, and the microcatheter were then advanced into an inferior division of the left MCA M2 region followed by the microcatheter. The guidewire was removed. A 4 x 40 mm Solitaire X retrieval device was deployed in the usual manner as described above. The 071 Zoom aspiration catheter was advanced to engage the occluded left MCA distally. Aspiration was then applied at the hub of the 071 Zoom aspiration catheter for a minute and a half. Following this the combination of the retrieval device, the microcatheter and the Zoom aspiration catheter were retrieved and removed with proximal flow arrest. A control arteriogram performed following reversal of flow arrest in the left ICA demonstrated complete revascularization of the occluded left middle cerebral artery distally achieving a TICI 3 revascularization. Moderate spasm of the anterior temporal branch responded to 5 aliquots of 25 mcg of nitroglycerin intra-arterially in addition to 2.5 mg of verapamil. A final control arteriogram performed through the balloon guide catheter in the proximal ICA continued to demonstrate patency of the left ICA intracranially and extra cranially. The left MCA demonstrated a TICI 3 revascularization. No change was seen in the left anterior cerebral artery. A flat panel CT of the brain demonstrated no evidence of hemorrhagic complications. An 8 French Angio-Seal closure device was deployed at the femoral puncture site for hemostasis. Distal pulses remained present unchanged in both feet at the end of procedure. The patient was then extubated. The patient recovered to where she was able to follow simple commands intermittently. The right lower extremity showed movement. No movement was noted of the right upper extremity. Patient was then transferred to the neuro ICU for post  revascularization care. IMPRESSION: Status post revascularization of occluded left middle cerebral artery M1 segment with a 4 mm x 40 mm Solitaire X retrieval device and contact aspiration achieving a TICI 3 revascularization. PLAN: As per referring MD. Electronically Signed   By: Julieanne Cotton M.D.   On: 04/12/2023 08:04   IR CT Head Ltd  Result Date: 04/12/2023 INDICATION: New onset left gaze deviation, right-sided neglect and right hemiparesis. EXAM: 1. EMERGENT LARGE VESSEL OCCLUSION THROMBOLYSIS (anterior CIRCULATION) COMPARISON:  CT angiogram of the head and neck of April 04, 2023. MEDICATIONS: Vancomycin 1 g IV antibiotic was administered within 1 hour of the procedure. ANESTHESIA/SEDATION: General anesthesia. CONTRAST:  Omnipaque 300 approximately 75 mL. FLUOROSCOPY TIME:  Fluoroscopy Time: 50 minutes 6 seconds (1123 mGy). COMPLICATIONS: None immediate. TECHNIQUE: Following  a full explanation of the procedure along with the potential associated complications, an informed witnessed consent was obtained. The risks of intracranial hemorrhage of 10%, worsening neurological deficit, ventilator dependency, death and inability to revascularize were all reviewed in detail with the patient's son. The patient was then put under general anesthesia by the Department of Anesthesiology at [facility]. The right groin was prepped and draped in the usual sterile fashion. Thereafter using modified Seldinger technique, transfemoral access into the right common femoral artery was obtained without difficulty. Over an 0.035 inch guidewire an 8 French 25 cm Pinnacle sheath was inserted. Through this, and also over an 0.035 inch guidewire a combination of a 125 cm Simmons 2 6 [place] support catheter inside of an 087 95 cm balloon guide catheter was advanced to the aortic arch region, and selectively positioned in the proximal left internal carotid artery. The guidewire, and the support catheter were removed.  Good aspiration was obtained from the hub of the balloon guide catheter. A gentle control arteriogram was then performed centered extra cranially and intracranially. FINDINGS: The left common carotid arteriogram demonstrates the left external carotid artery and its major branches to be widely patent. The left internal carotid artery at the bulb to the cranial skull base is widely patent. The petrous, the cavernous and the supraclinoid left ICA are widely patent. Left anterior cerebral artery demonstrates approximately 50% stenosis at its origin. More distally, the vessel opacifies normally into the capillary and venous phases. The left middle cerebral artery demonstrates complete occlusion distal to the origin of the anterior temporal branch. PROCEDURE: Through the balloon guide catheter in the proximal 1/3 of the left ICA, a combination of an 071 132 cm Zoom aspiration catheter with a Phenom microcatheter was advanced over an 018 inch micro guidewire with a moderate J configuration to the supraclinoid left ICA. The micro guidewire was then gently advanced through the occluded segment into the inferior division of the left MCA distal M2 region followed by the microcatheter the guidewire was removed. Good aspiration was obtained from the hub of the microcatheter. A 4 mm x 40 mm Solitaire X retrieval device was then advanced to the distal end of the microcatheter and deployed in the usual manner such that the proximal portion of the device was proximal to the occluded MCA. The Zoom aspiration catheter was advanced and engaged into the proximal portion of the occlusion of the left ICA. Constant aspiration was performed at the hub of the 071 Zoom aspiration catheter for approximately 2 minutes. Proximal flow arrest in the left internal carotid artery, constant aspiration at the hub of the balloon guide catheter, the combination of the retrieval device, the microcatheter, and the Zoom aspiration catheter was retrieved  and removed. A control arteriogram performed following reversal of flow arrest demonstrated no significant change in the occluded left MCA. A second pass was then made again using the above microcatheter and aspiration advanced over an 018 inch micro guidewire to the proximal left MCA. The guidewire, and the microcatheter were then advanced into an inferior division of the left MCA M2 region followed by the microcatheter. The guidewire was removed. A 4 x 40 mm Solitaire X retrieval device was deployed in the usual manner as described above. The 071 Zoom aspiration catheter was advanced to engage the occluded left MCA distally. Aspiration was then applied at the hub of the 071 Zoom aspiration catheter for a minute and a half. Following this the combination of the retrieval device, the  microcatheter and the Zoom aspiration catheter were retrieved and removed with proximal flow arrest. A control arteriogram performed following reversal of flow arrest in the left ICA demonstrated complete revascularization of the occluded left middle cerebral artery distally achieving a TICI 3 revascularization. Moderate spasm of the anterior temporal branch responded to 5 aliquots of 25 mcg of nitroglycerin intra-arterially in addition to 2.5 mg of verapamil. A final control arteriogram performed through the balloon guide catheter in the proximal ICA continued to demonstrate patency of the left ICA intracranially and extra cranially. The left MCA demonstrated a TICI 3 revascularization. No change was seen in the left anterior cerebral artery. A flat panel CT of the brain demonstrated no evidence of hemorrhagic complications. An 8 French Angio-Seal closure device was deployed at the femoral puncture site for hemostasis. Distal pulses remained present unchanged in both feet at the end of procedure. The patient was then extubated. The patient recovered to where she was able to follow simple commands intermittently. The right lower  extremity showed movement. No movement was noted of the right upper extremity. Patient was then transferred to the neuro ICU for post revascularization care. IMPRESSION: Status post revascularization of occluded left middle cerebral artery M1 segment with a 4 mm x 40 mm Solitaire X retrieval device and contact aspiration achieving a TICI 3 revascularization. PLAN: As per referring MD. Electronically Signed   By: Julieanne Cotton M.D.   On: 04/12/2023 08:04   MR BRAIN WO CONTRAST  Result Date: 04/05/2023 CLINICAL DATA:  Stroke, follow up EXAM: MRI HEAD WITHOUT CONTRAST TECHNIQUE: Multiplanar, multiecho pulse sequences of the brain and surrounding structures were obtained without intravenous contrast. COMPARISON:  CT head 04/04/2023 FINDINGS: Brain: Acute left MCA territory infarct involving the insula, left temporal lobe in the corona radiata. There is a additional punctate acute infarcts in the right parietal lobe. There is susceptibility artifact in the anterior left temporal lobe, favored to represent a small petechial hemorrhage. No mass effect. No mass lesion. No hydrocephalus. No extra-axial fluid collection Vascular: Normal flow voids. Skull and upper cervical spine: Normal marrow signal. Sinuses/Orbits: No middle ear or mastoid effusion. Paranasal sinuses are clear Other: None. IMPRESSION: 1. Acute left MCA territory infarct involving the insula, left temporal lobe and corona radiata. Susceptibility artifact in the anterior left temporal lobe, favored to represent a small petechial hemorrhage. No mass effect. 2. Additional punctate embolic acute infarcts in the right parietal lobe. These results will be called to the ordering clinician or representative by the Radiologist Assistant, and communication documented in the PACS or [employer]. Electronically Signed   By: Lorenza Cambridge M.D.   On: 04/05/2023 12:06   ECHOCARDIOGRAM COMPLETE  Result Date: 04/05/2023    ECHOCARDIOGRAM REPORT   Patient  Name:   [name] Date of Exam: 04/05/2023 Medical Rec #:  [id number]        Height:       65.0 in Accession #:    [id number]       Weight:       141.1 lb Date of Birth:  02-21-1941         BSA:          1.706 m Patient Age:    82 years         BP:           159/62 mmHg Patient Gender: F                HR:  64 bpm. Exam Location:  Inpatient Procedure: 2D Echo, Cardiac Doppler and Color Doppler Indications:    Stroke I63.9  History:        Patient has prior history of Echocardiogram examinations, most                 recent 03/25/2017. Angina and CAD, Stroke, Arrythmias:LBBB,                 Signs/Symptoms:Chest Pain; Risk Factors:Hypertension and                 Dyslipidemia.  Sonographer:    Lucendia Herrlich Referring Phys: Kalman Drape WOLFE IMPRESSIONS  1. Left ventricular ejection fraction, by estimation, is 45 to 50%. The left ventricle has mildly decreased function. The left ventricle demonstrates global hypokinesis. Left ventricular diastolic function could not be evaluated.  2. Right ventricular systolic function is normal. The right ventricular size is normal.  3. Left atrial size was mildly dilated.  4. Right atrial size was mildly dilated.  5. The mitral valve is normal in structure. Trivial mitral valve regurgitation. No evidence of mitral stenosis.  6. The aortic valve is tricuspid. There is mild calcification of the aortic valve. Aortic valve regurgitation is not visualized. No aortic stenosis is present.  7. The inferior vena cava is normal in size with <50% respiratory variability, suggesting right atrial pressure of 8 mmHg. FINDINGS  Left Ventricle: Left ventricular ejection fraction, by estimation, is 45 to 50%. The left ventricle has mildly decreased function. The left ventricle demonstrates global hypokinesis. The left ventricular internal cavity size was normal in size. There is  no left ventricular hypertrophy. Left ventricular diastolic function could not be evaluated due to atrial  fibrillation. Left ventricular diastolic function could not be evaluated. Right Ventricle: The right ventricular size is normal. No increase in right ventricular wall thickness. Right ventricular systolic function is normal. Left Atrium: Left atrial size was mildly dilated. Right Atrium: Right atrial size was mildly dilated. Pericardium: There is no evidence of pericardial effusion. Mitral Valve: The mitral valve is normal in structure. Trivial mitral valve regurgitation. No evidence of mitral valve stenosis. Tricuspid Valve: The tricuspid valve is normal in structure. Tricuspid valve regurgitation is mild . No evidence of tricuspid stenosis. Aortic Valve: The aortic valve is tricuspid. There is mild calcification of the aortic valve. Aortic valve regurgitation is not visualized. No aortic stenosis is present. Aortic valve peak gradient measures 9.9 mmHg. Pulmonic Valve: The pulmonic valve was normal in structure. Pulmonic valve regurgitation is trivial. No evidence of pulmonic stenosis. Aorta: The aortic root is normal in size and structure. Venous: The inferior vena cava is normal in size with less than 50% respiratory variability, suggesting right atrial pressure of 8 mmHg. IAS/Shunts: No atrial level shunt detected by color flow Doppler.  LEFT VENTRICLE PLAX 2D LVIDd:         5.00 cm   Diastology LVIDs:         3.70 cm   LV e' medial:    7.51 cm/s LV PW:         0.80 cm   LV E/e' medial:  14.2 LV IVS:        0.80 cm   LV e' lateral:   11.00 cm/s LVOT diam:     1.90 cm   LV E/e' lateral: 9.7 LV SV:         54 LV SV Index:   31 LVOT Area:     2.84 cm  RIGHT VENTRICLE            IVC RV S prime:     9.25 cm/s  IVC diam: 2.00 cm TAPSE (M-mode): 1.1 cm LEFT ATRIUM             Index        RIGHT ATRIUM           Index LA diam:        4.10 cm 2.40 cm/m   RA Area:     16.20 cm LA Vol (A2C):   43.3 ml 25.39 ml/m  RA Volume:   41.90 ml  24.57 ml/m LA Vol (A4C):   54.3 ml 31.84 ml/m LA Biplane Vol: 51.0 ml 29.90 ml/m   AORTIC VALVE                 PULMONIC VALVE AV Area (Vmax): 1.82 cm     PR End Diast Vel: 7.51 msec AV Vmax:        157.00 cm/s AV Peak Grad:   9.9 mmHg LVOT Vmax:      100.65 cm/s LVOT Vmean:     63.475 cm/s LVOT VTI:       0.189 m  AORTA Ao Root diam: 2.90 cm Ao Asc diam:  3.30 cm MITRAL VALVE                TRICUSPID VALVE MV Area (PHT): 5.02 cm     TR Peak grad:   42.5 mmHg MV Decel Time: 151 msec     TR Vmax:        326.00 cm/s MR Peak grad: 52.0 mmHg MR Vmax:      360.50 cm/s   SHUNTS MV E velocity: 107.00 cm/s  Systemic VTI:  0.19 m MV A velocity: 39.00 cm/s   Systemic Diam: 1.90 cm MV E/A ratio:  2.74 Arvilla Meres MD Electronically signed by Arvilla Meres MD Signature Date/Time: 04/05/2023/10:28:09 AM    Final    CT HEAD WO CONTRAST ( )  Result Date: 04/04/2023 CLINICAL DATA:  Stroke, follow up EXAM: CT HEAD WITHOUT CONTRAST TECHNIQUE: Contiguous axial images were obtained from the base of the skull through the vertex without intravenous contrast. RADIATION DOSE REDUCTION: This exam was performed according to the departmental dose-optimization program which includes automated exposure control, adjustment of the mA and/or kV according to patient size and/or use of iterative reconstruction technique. COMPARISON:  CT Head October 20, 24. FINDINGS: Motion limited study.  Within this limitation: Brain: No evidence of acute large vascular territory infarction, hemorrhage, hydrocephalus, extra-axial collection or mass lesion/mass effect. Patchy white matter hypodensities, nonspecific but compatible with chronic microvascular ischemic disease. Vascular: No hyperdense vessel.  Calcific atherosclerosis. Skull: No acute fracture. Sinuses/Orbits: Clear sinuses.  No acute orbital findings. IMPRESSION: Motion limited study without evidence of acute intracranial abnormality. Electronically Signed   By: Feliberto Harts M.D.   On: 04/04/2023 12:58   DG Chest Portable 1 View  Result Date:  04/04/2023 CLINICAL DATA:  stroke EXAM: PORTABLE CHEST 1 VIEW COMPARISON:  Chest x-ray dated April 24, 2020 FINDINGS: Mild cardiomegaly. Diffuse interstitial opacities. No evidence of pleural effusion or pneumothorax. IMPRESSION: Diffuse interstitial opacities, likely due to pulmonary edema. Electronically Signed   By: Allegra Lai M.D.   On: 04/04/2023 12:04   CT ANGIO HEAD NECK W WO CM W PERF (CODE STROKE)  Result Date: 04/04/2023 CLINICAL DATA:  Right-sided weakness EXAM: CT ANGIOGRAPHY HEAD AND NECK CT PERFUSION BRAIN TECHNIQUE: Multidetector CT imaging of the  head and neck was performed using the standard protocol during bolus administration of intravenous contrast. Multiplanar CT image reconstructions and MIPs were obtained to evaluate the vascular anatomy. Carotid stenosis measurements (when applicable) are obtained utilizing NASCET criteria, using the distal internal carotid diameter as the denominator. Multiphase CT imaging of the brain was performed following IV bolus contrast injection. Subsequent parametric perfusion maps were calculated using RAPID software. RADIATION DOSE REDUCTION: This exam was performed according to the departmental dose-optimization program which includes automated exposure control, adjustment of the mA and/or kV according to patient size and/or use of iterative reconstruction technique. CONTRAST:  OMNIPAQUE IOHEXOL 350 MG/ML SOLN COMPARISON:  Earlier today FINDINGS: CTA NECK FINDINGS Aortic arch: Atherosclerosis. Right carotid system: Mixed density plaque at the bifurcation and bulb. No flow reducing stenosis or ulceration Left carotid system: Atheromatous plaque greatest at the bifurcation. No flow reducing stenosis or ulceration. Vertebral arteries: No proximal subclavian stenosis. Atheromatous narrowing of the proximal right vertebral artery causing at least 50% stenosis. No ulceration or beading. Skeleton: No acute finding Other neck: No acute finding Upper  chest: Interlobular septal thickening at the apices. Review of the MIP images confirms the above findings CTA HEAD FINDINGS Anterior circulation: Left M1 occlusion with abrupt cut off consistent with emergent embolic disease. There is downstream underfilling. Atheromatous plaque affects the carotid siphons without flow reducing stenosis. Negative for aneurysm Posterior circulation: Fenestrated proximal basilar. The vertebral and basilar arteries are diffusely patent. No branch occlusion, beading, or vascular malformation. Atheromatous irregularity of the posterior cerebral arteries with moderate narrowing at the left P2 segment. 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 Venous sinuses: Diffusely patent Anatomic variants: None significant Review of the MIP images confirms the above findings CT Brain Perfusion Findings: ASPECTS: 10 CBF (<30%) Volume: 0mL Perfusion (Tmax>6.0s) volume: Case discussed with Dr. Otelia Limes while study was in progress. IMPRESSION: 1. Emergent large vessel occlusion at the left M1 segment. There is 146 cc of penumbra and no core infarct by CT perfusion. 2. No flow reducing stenosis or embolic source seen underlying the embolus. 3. Atherosclerosis with flow reducing stenosis at the right vertebral origin. Notable atheromatous irregularity of the posterior cerebral arteries. 4. 2 mm left superior cerebellar aneurysm. 5. Interstitial edema in the apical lungs. Electronically Signed   By: Tiburcio Pea M.D.   On: 04/04/2023 11:00   CT HEAD CODE STROKE WO CONTRAST  Result Date: 04/04/2023 CLINICAL DATA:  Code stroke. Right upper extremity weakness. Aphasia. EXAM: CT HEAD WITHOUT CONTRAST TECHNIQUE: Contiguous axial images were obtained from the base of the skull through the vertex without intravenous contrast. RADIATION DOSE REDUCTION: This exam was performed according to the departmental dose-optimization program which includes automated exposure control, adjustment of the mA  and/or kV according to patient size and/or use of iterative reconstruction technique. COMPARISON:  None Available. FINDINGS: Brain: No evidence of acute infarction, hemorrhage, hydrocephalus, extra-axial collection or mass lesion/mass effect. Mild generalized brain atrophy. Mild chronic small vessel ischemia in the deep cerebral white matter. Vascular: No hyperdense vessel or unexpected calcification. Skull: Normal. Negative for fracture or focal lesion. Sinuses/Orbits: No acute finding. Other: Prelim sent in epic chat. ASPECTS [facility] Stroke Program Early CT Score) - Ganglionic level infarction (caudate, lentiform nuclei, internal capsule, insula, M1-M3 cortex): 7 - Supraganglionic infarction (M4-M6 cortex): 3 Total score (0-10 with 10 being normal): 10 IMPRESSION: Aging brain without acute finding. Electronically Signed   By: Tiburcio Pea M.D.   On: 04/04/2023 10:22  HISTORY OF PRESENT ILLNESS  82 y.o. female with history of CAD, hypertension and left bundle branch block presenting with acute onset right-sided weakness, right facial droop and aphasia.   CT angiogram revealed left M1 occlusion, and patient was taken to interventional radiology for mechanical thrombectomy.  This was successful, with TICI 3 revascularization was achieved after 2 passes.  She was extubated postprocedure and appears to have improved but remains disoriented with some right-sided weakness.    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 ASPECTS 10.  CTA head & neck with CTP emergent LVO at left M1 segment with 146 cc of penumbra and no core infarct, atherosclerosis with flow reducing stenosis of right vertebral origin, 2 mm left superior cerebellar aneurysm MRI acute left MCA territory infarct involving insula left temporal lobe and corona radiata with  small petechial hemorrhage in anterior left temporal lobe, additional punctate embolic infarcts in right parietal lobe 2D Echo EF 45 to 50% LDL 87 HgbA1c 5.6 VTE prophylaxis - apixaban  aspirin 81 mg daily prior to admission, now on Eliquis for A-fib Therapy recommendations: CIR Disposition: CIR today   Atrial fibrillation Patient was found to have new onset atrial fibrillation this admission Was anticoagulated with heparin IV, now switched to Eliquis Appreciate cardiology input  Hypertension Home meds: Clonidine 0.1 mg twice daily, furosemide 40mg  daily, lisinopril 40 mg daily, losartan 100 mg daily, metoprolol 100 mg every 24 hours Resumed home clonidine and losartan 100mg  Hold metoprolol, due to bradycardia BP stable now BP goal normotensive   Hyperlipidemia Home meds: Atorvastatin 40 mg daily, increased to 80 LDL 87, goal < 70 Continue statin at discharge   Dysphagia Patient has post-stroke dysphagia, SLP consulted  on dysphagia 1 and thin liquid  Encourage p.o. intake advance diet as tolerated   Other Stroke Risk Factors Coronary artery disease  DISCHARGE EXAM Blood pressure (!) 142/63, pulse 95, temperature 97.6 F (36.4 C), temperature source Oral, resp. rate (!) 24, height 5\' 5"  (1.651 m), weight 64 kg, SpO2 100%.  PHYSICAL EXAM General:  Alert, well-nourished, well-developed pleasant elderly Caucasian lady in no acute distress Psych:  Mood and affect appropriate for situation CV: Regular rate and rhythm on monitor Respiratory:  Regular, unlabored respirations on room air GI: Abdomen soft and nontender   NEURO:  awake, alert, eyes open, orientated to hospital and people but not to age or time. Moderate expressive aphasia, some word salad with hesitation of speech with paraphasic errors. Able to name 3/3, and able to repeat simple sentences but not complex sentences. Follows all simple commands. No gaze palsy, tracking bilaterally, however, right hemianopia,  right facial droop. Tongue midline. LUE at least 4/5 and BLEs 4/5 but RUE 1-2/5 on bicep. Sensation intact without neglect, left FTN intact, gait not tested.     Discharge Diet      Diet   DIET - DYS 1 Fluid consistency: Nectar Thick   liquids  DISCHARGE PLAN Disposition:  Transfer to [facility] for ongoing PT, OT and ST Eliquis (apixaban) daily for secondary stroke prevention Recommend ongoing stroke risk factor control by Primary Care Physician at time of discharge from inpatient rehabilitation. Follow-up PCP Samuel Jester P, DO in 2 weeks following discharge from rehab. Follow-up in [facility] in 8 weeks following discharge from rehab, office to schedule  an appointment.   35 minutes were spent preparing discharge.  Pt seen by Neuro NP/APP and later by MD. Note/plan to be edited by MD as needed.    Lynnae January, DNP, AGACNP-BC [facility] Please use AMION for contact information & EPIC for messaging.  ATTENDING NOTE: I reviewed above note and agree with the assessment and plan. Pt was seen and examined.   No acute event overnight. Neuro stable. On eliquis and statin. Medically ready for CIR placement.   For detailed assessment and plan, please refer to above/below as I have made changes wherever appropriate.   Marvel Plan, MD PhD Stroke Neurology 04/12/2023 10:01 PM

## 2023-04-12 NOTE — H&P (Signed)
Physical Medicine and Rehabilitation Admission H&P        Chief Complaint  Patient presents with   Weakness   Code Stroke  : HPI: [name] is an 82 year old right-handed female with history of CAD/left bundle branch block maintained on low-dose aspirin, hypertension, hyperlipidemia.  Per chart review patient lives with son independent without assistive device performing all ADLs and driving at baseline prior to admission.  Presented 04/04/2023 to [facility] with acute onset of right side weakness facial droop and aphasia.  Admission chemistries unremarkable, troponin negative, BNP 228.  Cranial CT scan showed aging brain without acute findings.  CTA showed emergent large vessel occlusion of the left M1 segment.   2 mm left superior cerebellar aneurysm.  Not a candidate for TNK.  Patient was transferred to [facility] and underwent revascularization/mechanical thrombectomy per interventional radiology.  CT/MRI follow-up showed acute left MCA territory infarct involving the insula, left temporal lobe and corona radiata.  No mass effect.  Additional punctate embolic acute infarcts in the right parietal lobe.  Echocardiogram ejection fraction of 45 to 50%.  The left ventricle demonstrating global hypokinesis.  Hospital course new onset atrial fibrillation initially on aspirin transition to Eliquis.  Currently on a dysphagia #1 nectar thick liquid diet.  Therapy evaluations completed due to patient decreased functional ability right-sided weakness with aphasia/dysphagia was admitted for a comprehensive rehab program.   Review of Systems  Constitutional:  Negative for chills and fever.  HENT:  Negative for hearing loss.   Eyes:  Negative for blurred vision and double vision.  Respiratory:  Negative for cough, shortness of breath and wheezing.   Cardiovascular:  Positive for palpitations.       Atypical chest pain  Gastrointestinal:  Positive for constipation. Negative for  heartburn, nausea and vomiting.  Genitourinary:  Negative for dysuria, flank pain and hematuria.  Musculoskeletal:  Positive for myalgias.  Skin:  Negative for rash.  Neurological:  Positive for sensory change, speech change and focal weakness.  All other systems reviewed and are negative.       Past Medical History:  Diagnosis Date   Chest pain     Coronary artery disease     Hypertension     LBBB (left bundle branch block)               Past Surgical History:  Procedure Laterality Date   ABDOMINAL HYSTERECTOMY       CARDIAC CATHETERIZATION N/A 03/23/2016    Procedure: Left Heart Cath and Coronary Angiography;  Surgeon: Lennette Bihari, MD;  Location: [facility] INVASIVE CV LAB;  Service: Cardiovascular;  Laterality: N/A;   CHOLECYSTECTOMY       RADIOLOGY WITH ANESTHESIA N/A 04/04/2023    Procedure: IR WITH ANESTHESIA;  Surgeon: Radiologist, Medication, MD;  Location: [facility] OR;  Service: Radiology;  Laterality: N/A;   VARICOSE VEIN SURGERY                 Family History  Problem Relation Age of Onset   Cancer Mother     Heart attack Father          Social History:  reports that she has never smoked. She has never used smokeless tobacco. She reports that she does not drink alcohol and does not use drugs. Allergies:  Allergies      Allergies  Allergen Reactions   Codeine Nausea And Vomiting   Penicillins Nausea And Vomiting  Medications Prior to Admission  Medication Sig Dispense Refill   acetaminophen (TYLENOL) 500 MG tablet Take 500 mg by mouth every 6 (six) hours as needed for mild pain.       aspirin EC 81 MG tablet Take 1 tablet (81 mg total) by mouth daily. 90 tablet 3   atorvastatin (LIPITOR) 40 MG tablet Take 1 tablet (40 mg total) by mouth daily. 30 tablet 12   Cholecalciferol (D-3-5) 5000 units capsule Take 5,000 Units by mouth daily.       cloNIDine (CATAPRES) 0.1 MG tablet Take 1 tablet (0.1 mg total) by mouth 2 (two) times daily. 180 tablet 3    furosemide (LASIX) 40 MG tablet Take 40 mg by mouth daily.       HYDROcodone-acetaminophen (NORCO/VICODIN) 5-325 MG tablet Take 1 tablet by mouth every 6 (six) hours as needed for moderate pain.       lisinopril (ZESTRIL) 40 MG tablet Take 1 tablet by mouth once daily for 90 days       losartan (COZAAR) 100 MG tablet Take 100 mg by mouth daily.       metoprolol succinate (TOPROL-XL) 100 MG 24 hr tablet Take 100 mg by mouth daily. Take with or immediately following a meal.                  Home: Home Living Family/patient expects to be discharged to:: Private residence Living Arrangements:  (lives with son, Fayrene Fearing and grandson , Apolinar Junes) Available Help at Discharge: Family, Available 24 hours/day Fayrene Fearing will work out 24/7 supervision) Type of Home: House Home Access: Stairs to enter Secretary/administrator of Steps: 3 Entrance Stairs-Rails: None Home Layout: One level Bathroom Shower/Tub: Engineer, manufacturing systems: Pharmacist, community: Yes Home Equipment: None Additional Comments: wears glasses for reading  Lives With: Son   Functional History: Prior Function Prior Level of Function : Independent/Modified Independent, Driving Mobility Comments: no use of AD ADLs Comments: does all the home making and cooking. waas taking care of her neice during the week   Functional Status:  Mobility: Bed Mobility Overal bed mobility: Needs Assistance Bed Mobility: Supine to Sit, Sit to Supine Supine to sit: Contact guard Sit to supine: Mod assist General bed mobility comments: min A to return RLE to bed at end of session, increased time for pt to visually locate RUE as pt leaving it behind in bed, education on using LLE to pick up and place and attend to RUE Transfers Overall transfer level: Needs assistance Equipment used: Rolling walker (2 wheels) Transfers: Sit to/from Stand, Bed to chair/wheelchair/BSC Sit to Stand: Min assist Bed to/from chair/wheelchair/BSC  transfer type:: Step pivot Step pivot transfers: Min assist General transfer comment: min A to steady on rise with max A to place RUE on the RW. Ambulation/Gait Ambulation/Gait assistance: Min assist Gait Distance (Feet): 200 Feet Assistive device: Rolling walker (2 wheels) Gait Pattern/deviations: Step-through pattern, Decreased step length - left, Drifts right/left General Gait Details: Min A for R sided obstacles pt runs AD into obstacles and requires verbal cues to scan for objects on the R, challeneged pt to find jar of mints and brain figure with pt only looking to L, having to stop to attend to R, able to locate on return. Gait velocity: Decreased Gait velocity interpretation: <1.31 ft/sec, indicative of household ambulator   ADL: ADL Overall ADL's : Needs assistance/impaired Eating/Feeding: Minimal assistance Eating/Feeding Details (indicate cue type and reason): needs cues for R side pocketing of liquids during session.  drinking from cup with straw. son present and educated on use of suction if needed Grooming: Wash/dry face, Minimal assistance Grooming Details (indicate cue type and reason): using L UE Upper Body Bathing: Moderate assistance Lower Body Bathing: Maximal assistance Upper Body Dressing : Moderate assistance Lower Body Dressing: Set up, Sitting/lateral leans Lower Body Dressing Details (indicate cue type and reason): able to manage B socks with figure 4 method seated in recliner Toilet Transfer: Minimal assistance, Cueing for sequencing, Stand-pivot, BSC/3in1 Toileting- Clothing Manipulation and Hygiene: Maximal assistance, Sit to/from stand Functional mobility during ADLs: Minimal assistance, Rolling walker (2 wheels) General ADL Comments: ambulated from 3n1, around the bed to recliner   Cognition: Cognition Overall Cognitive Status: Impaired/Different from baseline Arousal/Alertness: Awake/alert Orientation Level: Oriented to person, Oriented to  place Attention: Focused, Sustained Focused Attention: Appears intact Sustained Attention: Impaired Sustained Attention Impairment: Verbal basic Memory: Impaired Memory Impairment: Decreased recall of new information, Storage deficit, Retrieval deficit Awareness: Impaired Awareness Impairment: Intellectual impairment, Emergent impairment Problem Solving: Impaired Problem Solving Impairment: Verbal basic, Functional basic Executive Function: Reasoning, Sequencing, Self Monitoring, Self Correcting Behaviors: Perseveration Cognition Arousal: Alert Behavior During Therapy: WFL for tasks assessed/performed Overall Cognitive Status: Impaired/Different from baseline Area of Impairment: Attention, Memory, Following commands, Safety/judgement, Awareness, Problem solving, Orientation Orientation Level: Disoriented to, Situation (stating  "they keep telling me I had a stroke, did I have a stroke?") Current Attention Level: Sustained Memory: Decreased recall of precautions, Decreased short-term memory Following Commands: Follows one step commands consistently Safety/Judgement: Decreased awareness of safety, Decreased awareness of deficits Awareness: Intellectual Problem Solving: Slow processing, Requires verbal cues General Comments: repeating same conversation throughout session. repeating "i jsut dont know why this happened to me"   Physical Exam: Blood pressure 138/66, pulse 95, temperature 97.8 F (36.6 C), temperature source Oral, resp. rate 18, height 5\' 5"  (1.651 m), weight 64 kg, SpO2 100%. Physical Exam Neurological:     Comments: Patient is alert and makes eye contact with examiner.  She follows simple commands.  She can name hospital and family members but not age or time.   speech without dysarthria or aphasia    General: No acute distress Mood and affect are appropriate Heart: Regular rate and rhythm no rubs murmurs or extra sounds Lungs: Clear to auscultation, breathing  unlabored, no rales or wheezes Abdomen: Positive bowel sounds, soft nontender to palpation, nondistended Extremities: No clubbing, cyanosis, or edema Skin: No evidence of breakdown, no evidence of rash Neurologic: Cranial nerves II through XII intact, motor strength is 5/5 in Left  deltoid, bicep, tricep, grip, 5/5 Bilateral hip flexor, knee extensors, ankle dorsiflexor and plantar flexor RUE 1+ grip, 1+ bi, 2/5 tri, 0/5 delt Sensory exam normal sensation to light touch and proprioception in bilateral upper and lower extremities Cerebellar exam Limited RUE due to weakness Musculoskeletal: Full range of motion in all 4 extremities. No joint swelling    Lab Results Last 48 Hours        Results for orders placed or performed during the hospital encounter of 04/04/23 (from the past 48 hour(s))  Magnesium     Status: None    Collection Time: 04/11/23  5:58 AM  Result Value Ref Range    Magnesium 2.0 1.7 - 2.4 mg/dL      Comment: Performed at [facility], [address] [place], [place] [address]  Basic metabolic panel     Status: Abnormal    Collection Time: 04/11/23  5:58 AM  Result Value Ref  Range    Sodium 137 135 - 145 mmol/L    Potassium 4.3 3.5 - 5.1 mmol/L    Chloride 104 98 - 111 mmol/L    CO2 25 22 - 32 mmol/L    Glucose, Bld 113 (H) 70 - 99 mg/dL      Comment: Glucose reference range applies only to samples taken after fasting for at least 8 hours.    BUN 15 8 - 23 mg/dL    Creatinine, Ser 4.09 0.44 - 1.00 mg/dL    Calcium 9.1 8.9 - 81.1 mg/dL    GFR, Estimated >91 >47 mL/min      Comment: (NOTE) Calculated using the CKD-EPI Creatinine Equation (2021)      Anion gap 8 5 - 15      Comment: Performed at [facility], [address], [place], [place] [address]  CBC     Status: Abnormal    Collection Time: 04/11/23  5:58 AM  Result Value Ref Range    WBC 5.3 4.0 - 10.5 K/uL    RBC 3.96 3.87 - 5.11 MIL/uL    Hemoglobin 11.5 (L) 12.0 - 15.0 g/dL    HCT 21.3 (L)  08.6 - 46.0 %    MCV 87.4 80.0 - 100.0 fL    MCH 29.0 26.0 - 34.0 pg    MCHC 33.2 30.0 - 36.0 g/dL    RDW 57.8 46.9 - 62.9 %    Platelets 170 150 - 400 K/uL    nRBC 0.0 0.0 - 0.2 %      Comment: Performed at [facility], [address], [place], [place] [address]      Imaging Results (Last 48 hours)  No results found.         Blood pressure 138/66, pulse 95, temperature 97.8 F (36.6 C), temperature source Oral, resp. rate 18, height 5\' 5"  (1.651 m), weight 64 kg, SpO2 100%.   Medical Problem List and Plan: 1. Functional deficits secondary to left MCA territory infarction with left M1 occlusion status post revascularization             -patient may shower             -ELOS/Goals: 10-14d Sup Mobility and ADLs  2.  Antithrombotics: -DVT/anticoagulation:  Pharmaceutical: Eliquis             -antiplatelet therapy: N/A 3. Pain Management: Tylenol as needed 4. Mood/Behavior/Sleep: Provide emotional support             -antipsychotic agents: N/A 5. Neuropsych/cognition: This patient is not capable of making decisions on her own behalf. 6. Skin/Wound Care: Routine skin checks 7. Fluids/Electrolytes/Nutrition: Routine in and outs with follow-up chemistries 8.  New onset atrial fibrillation.  Continue Eliquis.  Cardiac rate controlled 9.  Hypertension.  Clonidine 0.1 mg twice daily, Cozaar 100 mg daily.  Monitor with increased mobility 10.  Hyperlipidemia.  Lipitor 11.  Dysphagia.  Dysphagia #1 nectar liquid.         Mcarthur Rossetti Angiulli, PA-C 04/12/2023  "I have personally performed a face to face diagnostic evaluation of this patient.  Additionally, I have reviewed and concur with the physician assistant's documentation above." Erick Colace M.D. [facility] Fellow Am Acad of [employer] Med and Rehab Diplomate Am Board of Electrodiagnostic Med Fellow Am Board of Interventional Pain

## 2023-04-12 NOTE — Progress Notes (Signed)
Patient HR dropped again tonight to the 30s while sleeping but went up to the upper 50s when awake, on-call notified, no new order. We continue to monitor.

## 2023-04-12 NOTE — Progress Notes (Signed)
Occupational Therapy Treatment Patient Details Name: [name] MRN: [id number] DOB: 11/13/1940 Today's Date: 04/12/2023   History of present illness 82 y.o. female who presented 04/04/23 with acute onset right-sided weakness, right facial droop and aphasia.  CT angiogram=  left M1 occlusion  s/p thrombectomy on 10/20. 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 PMH significant for HTN, LBBB, CAD.   OT comments  Pt progressing towards goals, eager to get OOB to use the bathroom upon arrival. Pt needing CGA-min A for transfers with RW, cues and physical assist to keep RUE on RW. Pt able to complete x2 standing grooming tasks at sink with LUE, weightbearing through RUE. Pt able to perform seated tabletop washcloth exercises for RUE. Pt presenting with impairments listed below, will follow acutely. Patient will benefit from intensive inpatient follow up therapy, >3 hours/day to maximize safety/ind with ADLs/functional mobility.       If plan is discharge home, recommend the following:  A lot of help with walking and/or transfers;A lot of help with bathing/dressing/bathroom   Equipment Recommendations  BSC/3in1;Wheelchair (measurements OT);Wheelchair cushion (measurements OT)    Recommendations for Other Services Rehab consult;Speech consult    Precautions / Restrictions Precautions Precautions: Fall Precaution Comments: SBP <180 Restrictions Weight Bearing Restrictions: No       Mobility Bed Mobility Overal bed mobility: Needs Assistance Bed Mobility: Supine to Sit, Sit to Supine     Supine to sit: Contact guard Sit to supine: Contact guard assist        Transfers Overall transfer level: Needs assistance Equipment used: Rolling walker (2 wheels) Transfers: Sit to/from Stand, Bed to chair/wheelchair/BSC Sit to  Stand: Contact guard assist                 Balance Overall balance assessment: Needs assistance Sitting-balance support: Single extremity supported, Feet supported Sitting balance-Leahy Scale: Fair Sitting balance - Comments: CGA at EOB.   Standing balance support: Bilateral upper extremity supported, During functional activity Standing balance-Leahy Scale: Poor                             ADL either performed or assessed with clinical judgement   ADL Overall ADL's : Needs assistance/impaired     Grooming: Brushing hair;Contact guard assist Grooming Details (indicate cue type and reason): using L UE                 Toilet Transfer: Minimal assistance;Ambulation;Regular Toilet;Rolling walker (2 wheels)   Toileting- Clothing Manipulation and Hygiene: Supervision/safety Toileting - Clothing Manipulation Details (indicate cue type and reason): pericare in sitting     Functional mobility during ADLs: Minimal assistance;Rolling walker (2 wheels)      Extremity/Trunk Assessment Upper Extremity Assessment Upper Extremity Assessment: Right hand dominant RUE Deficits / Details: minimal shoulder activation, minimal grasp RUE Sensation: decreased light touch;decreased proprioception RUE Coordination: decreased fine motor;decreased gross motor   Lower Extremity Assessment Lower Extremity Assessment: Defer to PT evaluation        Vision   Vision Assessment?: Wears glasses for reading;Vision impaired- to be further tested in functional context   Perception Perception Perception: Impaired Preception Impairment Details: Inattention/Neglect Perception-Other Comments: R inattention   Praxis      Cognition Arousal: Alert Behavior During Therapy: WFL for tasks assessed/performed Overall Cognitive Status: Impaired/Different from baseline Area  of Impairment: Attention, Memory, Following commands, Safety/judgement, Awareness, Problem solving, Orientation                  Orientation Level: Situation Current Attention Level: Sustained Memory: Decreased recall of precautions, Decreased short-term memory Following Commands: Follows one step commands consistently Safety/Judgement: Decreased awareness of safety, Decreased awareness of deficits Awareness: Emergent Problem Solving: Slow processing, Requires verbal cues General Comments: repeating same conversation throughout session. repeating "i jsut dont know why this happened to me"        Exercises Other Exercises Other Exercises: RUE AAROM table top horizontal add/abd Other Exercises: RUE AAROM tabletop flex/ext Other Exercises: RUE AAROM digit composite flex/ext    Shoulder Instructions       General Comments VSS on RA    Pertinent Vitals/ Pain       Pain Assessment Pain Assessment: No/denies pain  Home Living                                          Prior Functioning/Environment              Frequency  Min 1X/week        Progress Toward Goals  OT Goals(current goals can now be found in the care plan section)  Progress towards OT goals: Progressing toward goals  Acute Rehab OT Goals Patient Stated Goal: none stated OT Goal Formulation: With patient/family Time For Goal Achievement: 04/20/23 Potential to Achieve Goals: Good ADL Goals Pt Will Perform Eating: with supervision;sitting Pt Will Perform Grooming: with supervision;sitting Pt Will Transfer to Toilet: with mod assist;stand pivot transfer;bedside commode Pt/caregiver will Perform Home Exercise Program: Increased ROM;Increased strength;Right Upper extremity;With written HEP provided Additional ADL Goal #1: pt will complete 2 step command 50% of session  Plan      Co-evaluation                 AM-PAC OT "6 Clicks" Daily Activity     Outcome Measure   Help from another person eating meals?: A Little Help from another person taking care of personal grooming?: A  Little Help from another person toileting, which includes using toliet, bedpan, or urinal?: A Lot Help from another person bathing (including washing, rinsing, drying)?: A Lot Help from another person to put on and taking off regular upper body clothing?: A Lot Help from another person to put on and taking off regular lower body clothing?: A Lot 6 Click Score: 14    End of Session Equipment Utilized During Treatment: Gait belt;Rolling walker (2 wheels)  OT Visit Diagnosis: Unsteadiness on feet (R26.81)   Activity Tolerance Patient tolerated treatment well   Patient Left in bed;with call bell/phone within reach;with bed alarm set   Nurse Communication Mobility status        Time: 5284-1324 OT Time Calculation (min): 25 min  Charges: OT General Charges $OT Visit: 1 Visit OT Treatments $Self Care/Home Management : 8-22 mins $Therapeutic Exercise: 8-22 mins  Carver Fila, OTD, OTR/L SecureChat Preferred Acute Rehab [phone]   Dorene Grebe K Koonce 04/12/2023, 2:58 PM

## 2023-04-12 NOTE — TOC Transition Note (Signed)
Transition of Care [facility]) - CM/SW Discharge Note   Patient Details  Name: [name] MRN: [id number] Date of Birth: [birth year]  Transition of [facility]) CM/SW Contact:  Kermit Balo, RN Phone Number: 04/12/2023, 10:52 AM   Clinical Narrative:     Patient is discharging to [facility] today. No further needs per TOC.  Final next level of care: IP Rehab Facility Barriers to Discharge: No Barriers Identified   Patient Goals and CMS Choice CMS Medicare.gov Compare Post Acute Care list provided to:: Patient Choice offered to / list presented to : Patient  Discharge Placement                         Discharge Plan and Services Additional resources added to the After Visit Summary for                                       Social Determinants of Health (SDOH) Interventions SDOH Screenings   Food Insecurity: No Food Insecurity (04/06/2023)  Housing: Patient Declined (04/06/2023)  Transportation Needs: No Transportation Needs (04/06/2023)  Utilities: Not At Risk (04/06/2023)  Financial Resource Strain: Low Risk  (03/24/2020)   Received from [facility]  Social Connections: Unknown (03/24/2020)   Received from [facility]  Tobacco Use: Low Risk  (04/06/2023)     Readmission Risk Interventions     No data to display

## 2023-04-12 NOTE — Progress Notes (Signed)
Inpatient Rehab Admissions Coordinator:   Met with patient and spoke with son, Fayrene Fearing by phone. Patient will be moving to CIR today. Son is to bring patient clothes.   Rehab Admissons Coordinator [place], [place], [place] [phone]

## 2023-04-13 DIAGNOSIS — I63512 Cerebral infarction due to unspecified occlusion or stenosis of left middle cerebral artery: Secondary | ICD-10-CM

## 2023-04-13 LAB — CBC WITH DIFFERENTIAL/PLATELET
Abs Immature Granulocytes: 0 10*3/uL (ref 0.00–0.07)
Basophils Absolute: 0 10*3/uL (ref 0.0–0.1)
Basophils Relative: 0 %
Eosinophils Absolute: 0.1 10*3/uL (ref 0.0–0.5)
Eosinophils Relative: 1 %
HCT: 36.4 % (ref 36.0–46.0)
Hemoglobin: 11.9 g/dL — ABNORMAL LOW (ref 12.0–15.0)
Lymphocytes Relative: 22 %
Lymphs Abs: 1.1 10*3/uL (ref 0.7–4.0)
MCH: 28.4 pg (ref 26.0–34.0)
MCHC: 32.7 g/dL (ref 30.0–36.0)
MCV: 86.9 fL (ref 80.0–100.0)
Monocytes Absolute: 0.2 10*3/uL (ref 0.1–1.0)
Monocytes Relative: 4 %
Neutro Abs: 3.7 10*3/uL (ref 1.7–7.7)
Neutrophils Relative %: 73 %
Platelets: 182 10*3/uL (ref 150–400)
RBC: 4.19 MIL/uL (ref 3.87–5.11)
RDW: 12.7 % (ref 11.5–15.5)
WBC: 5 10*3/uL (ref 4.0–10.5)
nRBC: 0 % (ref 0.0–0.2)
nRBC: 0 /100{WBCs}

## 2023-04-13 LAB — COMPREHENSIVE METABOLIC PANEL
ALT: 70 U/L — ABNORMAL HIGH (ref 0–44)
AST: 53 U/L — ABNORMAL HIGH (ref 15–41)
Albumin: 3.4 g/dL — ABNORMAL LOW (ref 3.5–5.0)
Alkaline Phosphatase: 57 U/L (ref 38–126)
Anion gap: 11 (ref 5–15)
BUN: 14 mg/dL (ref 8–23)
CO2: 25 mmol/L (ref 22–32)
Calcium: 9.3 mg/dL (ref 8.9–10.3)
Chloride: 102 mmol/L (ref 98–111)
Creatinine, Ser: 0.86 mg/dL (ref 0.44–1.00)
GFR, Estimated: >60 mL/min (ref >60)
Glucose, Bld: 107 mg/dL — ABNORMAL HIGH (ref 70–99)
Potassium: 4 mmol/L (ref 3.5–5.1)
Sodium: 138 mmol/L (ref 135–145)
Total Bilirubin: 0.8 mg/dL (ref 0.3–1.2)
Total Protein: 6.3 g/dL — ABNORMAL LOW (ref 6.5–8.1)

## 2023-04-13 LAB — VITAMIN D 25 HYDROXY (VIT D DEFICIENCY, FRACTURES): Vit D, 25-Hydroxy: 23.16 ng/mL — ABNORMAL LOW (ref 30–100)

## 2023-04-13 MED ORDER — HYDROCERIN EX CREA
TOPICAL_CREAM | Freq: Two times a day (BID) | CUTANEOUS | Status: DC
  Filled 2023-04-13: qty 113

## 2023-04-13 MED ORDER — CEPHALEXIN 250 MG PO CAPS
500.0000 mg | ORAL_CAPSULE | Freq: Two times a day (BID) | ORAL | Status: DC
  Administered 2023-04-13 – 2023-04-15 (×5): 500 mg via ORAL
  Filled 2023-04-13 (×5): qty 2

## 2023-04-13 NOTE — Evaluation (Signed)
Occupational Therapy Assessment and Plan  Patient Details  Name: [name] MRN: [id number] Date of Birth: 1941/03/02  OT Diagnosis: abnormal posture, altered mental status, cognitive deficits, disturbance of vision, and hemiplegia affecting dominant side Rehab Potential: Rehab Potential (ACUTE ONLY): Good ELOS: 7-9 days   Today's Date: 04/13/2023 OT Individual Time: 7253-6644 OT Individual Time Calculation (min): 70 min     Hospital Problem: Principal Problem:   Left middle cerebral artery stroke [facility])   Past Medical History:  Past Medical History:  Diagnosis Date   Chest pain    Coronary artery disease    Hypertension    LBBB (left bundle branch block)    Past Surgical History:  Past Surgical History:  Procedure Laterality Date   ABDOMINAL HYSTERECTOMY     CARDIAC CATHETERIZATION N/A 03/23/2016   Procedure: Left Heart Cath and Coronary Angiography;  Surgeon: Lennette Bihari, MD;  Location: [facility] INVASIVE CV LAB;  Service: Cardiovascular;  Laterality: N/A;   CHOLECYSTECTOMY     IR CT HEAD LTD  04/04/2023   IR PERCUTANEOUS ART THROMBECTOMY/INFUSION INTRACRANIAL INC DIAG ANGIO  04/04/2023   RADIOLOGY WITH ANESTHESIA N/A 04/04/2023   Procedure: IR WITH ANESTHESIA;  Surgeon: Radiologist, Medication, MD;  Location: [facility] OR;  Service: Radiology;  Laterality: N/A;   VARICOSE VEIN SURGERY      Assessment & Plan Clinical Impression: [name] is an 82 year old right-handed female with history of CAD/left bundle branch block maintained on low-dose aspirin, hypertension, hyperlipidemia. Per chart review patient lives with son independent without assistive device performing all ADLs and driving at baseline prior to admission. Presented 04/04/2023 to [facility] with acute onset of right side weakness facial droop and aphasia. Admission chemistries unremarkable, troponin negative, BNP 228. Cranial CT scan showed aging brain without acute findings. CTA showed emergent large  vessel occlusion of the left M1 segment. 2 mm left superior cerebellar aneurysm. Not a candidate for TNK. Patient was transferred to [facility] and underwent revascularization/mechanical thrombectomy per interventional radiology. CT/MRI follow-up showed acute left MCA territory infarct involving the insula, left temporal lobe and corona radiata. No mass effect. Additional punctate embolic acute infarcts in the right parietal lobe. Echocardiogram ejection fraction of 45 to 50%. The left ventricle demonstrating global hypokinesis. Hospital course new onset atrial fibrillation initially on aspirin transition to Eliquis. Currently on a dysphagia #1 nectar thick liquid diet. Therapy evaluations completed due to patient decreased functional ability right-sided weakness with aphasia/dysphagia was admitted for a comprehensive rehab program.   Patient currently requires max with basic self-care skills and IADL secondary to muscle weakness, muscle joint tightness, and muscle paralysis, decreased cardiorespiratoy endurance, impaired timing and sequencing, abnormal tone, unbalanced muscle activation, motor apraxia, decreased coordination, and decreased motor planning, decreased visual acuity and decreased visual motor skills, decreased attention to right and decreased motor planning, decreased attention, decreased awareness, decreased problem solving, decreased safety awareness, decreased memory, and delayed processing, and decreased sitting balance, decreased standing balance, decreased postural control, hemiplegia, and decreased balance strategies.  Prior to hospitalization, patient could complete A/IADL's  with independent  level.   Patient will benefit from skilled intervention to decrease level of assist with basic self-care skills, increase independence with basic self-care skills, and increase level of independence with iADL prior to discharge home with care partner.  Anticipate patient will require minimal  physical assistance and follow up home health.  OT - End of Session Activity Tolerance: Decreased this session Endurance Deficit: Yes OT Assessment Rehab Potential (  ACUTE ONLY): Good OT Barriers to Discharge: Inaccessible home environment;Home environment access/layout OT Barriers to Discharge Comments: 3 STE OT Patient demonstrates impairments in the following area(s): Balance;Nutrition;Vision;Behavior;Pain;Cognition;Perception;Edema;Safety;Endurance;Sensory;Motor;Skin Integrity OT Basic ADL's Functional Problem(s): Eating;Grooming;Bathing;Dressing;Toileting OT Advanced ADL's Functional Problem(s): Simple Meal Preparation;Laundry;Light Housekeeping OT Transfers Functional Problem(s): Toilet;Tub/Shower OT Additional Impairment(s): Fuctional Use of Upper Extremity OT Plan OT Intensity: Minimum of 1-2 x/day, 45 to 90 minutes OT Frequency: 5 out of 7 days OT Duration/Estimated Length of Stay: 7-9 days OT Treatment/Interventions: Balance/vestibular training;Community reintegration;Disease mangement/prevention;Functional electrical stimulation;Neuromuscular re-education;Patient/family education;Self Care/advanced ADL retraining;Splinting/orthotics;Therapeutic Exercise;UE/LE Coordination activities;Wheelchair propulsion/positioning;Cognitive remediation/compensation;Discharge planning;DME/adaptive equipment instruction;Functional mobility training;Pain management;Psychosocial support;Skin care/wound managment;Therapeutic Activities;UE/LE Strength taining/ROM;Visual/perceptual remediation/compensation OT Self Feeding Anticipated Outcome(s): Supervision OT Basic Self-Care Anticipated Outcome(s): Supervision OT Toileting Anticipated Outcome(s): Supervision OT Bathroom Transfers Anticipated Outcome(s): Supervision OT Recommendation Recommendations for Other Services: Neuropsych consult;Therapeutic Recreation consult Therapeutic Recreation Interventions: Pet therapy;Kitchen group;Stress  management;Outing/community reintergration Patient destination: Home Follow Up Recommendations: Home health OT Equipment Recommended: 3 in 1 bedside comode;Tub/shower bench Equipment Details: commode, TTB   OT Evaluation Precautions/Restrictions  Precautions Precautions: Fall Precaution Comments: R hemi (UE>LE); dysphagia 1 diet with nectar thick liquids Restrictions Weight Bearing Restrictions: No  Pain Pain Assessment Pain Scale: 0-10 Pain Score: 0-No pain Home Living/Prior Functioning Home Living Living Arrangements: Children, Other relatives Available Help at Discharge: Family, Available 24 hours/day Type of Home: House Home Access: Stairs to enter [employer] of Steps: 3 Entrance Stairs-Rails: Left Home Layout: Two level, Able to live on main level with bedroom/bathroom Bathroom Shower/Tub: Engineer, manufacturing systems: Standard Bathroom Accessibility: Yes  Lives With: Son Prior Function Level of Independence: Independent with gait, Independent with basic ADLs, Independent with transfers  Able to Take Stairs?: Yes Driving: Yes Vocation: Full time employment 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 [employer] Baseline Vision/History: 1 Wears glasses Ability to See in Adequate Light: 2 Moderately impaired Patient Visual Report: Blurring of vision;Peripheral vision impairment Vision Assessment?: Wears glasses for reading;Vision impaired- to be further tested in functional context;Yes Eye Alignment: Within Functional Limits Ocular Range of Motion: Restricted looking up Alignment/Gaze Preference: Within Defined Limits Tracking/Visual Pursuits: Decreased smoothness of horizontal tracking;Decreased smoothness of vertical tracking Saccades: Impaired - to be further tested in functional context Convergence: Impaired (comment) Visual Fields: No apparent deficits Additional Comments: R sided decreased attnetion and visual scanning and  awareness Perception  Perception: Impaired Perception-Other Comments: R inattention Praxis Praxis: Impaired Praxis Impairment Details: Initiation;Motor planning Cognition Cognition Overall Cognitive Status: Impaired/Different from baseline Arousal/Alertness: Awake/alert Memory: Impaired Memory Impairment: Decreased recall of new information;Storage deficit;Retrieval deficit;Decreased short term memory Decreased Short Term Memory: Verbal basic;Functional basic Attention: Focused;Sustained Focused Attention: Appears intact Sustained Attention: Appears intact Sustained Attention Impairment: Verbal basic Awareness: Impaired Awareness Impairment: Intellectual impairment Problem Solving: Impaired Problem Solving Impairment: Verbal basic;Functional basic Executive Function: Reasoning;Sequencing;Self Monitoring;Self Correcting Reasoning: Impaired Reasoning Impairment: Verbal basic;Functional basic Sequencing: Impaired Sequencing Impairment: Verbal basic;Functional basic Self Monitoring: Impaired Self Monitoring Impairment: Verbal basic;Functional basic Self Correcting: Impaired Self Correcting Impairment: Verbal basic;Functional basic Behaviors: Perseveration Safety/Judgment: Impaired Comments: Decreased insight into limitiations and deficits and limited cognitive endurance requesting to return to bed as well as perseveration on topics Brief Interview for Mental Status (BIMS) Repetition of Three Words (First Attempt): 3 Temporal Orientation: Year: Missed by more than 5 years Temporal Orientation: Month: Missed by more than 1 month Temporal Orientation: Day: Incorrect Recall: "Sock": No, could not recall Recall: "Blue": No, could not recall Recall: "Bed": No, could not recall BIMS  Summary Score: 3 Sensation Sensation Light Touch: Appears Intact Hot/Cold: Appears Intact Proprioception: Impaired by gross assessment Stereognosis: Impaired by gross assessment Coordination Gross  Motor Movements are Fluid and Coordinated: No Fine Motor Movements are Fluid and Coordinated: No Coordination and Movement Description: R hemi Finger Nose Finger Test: unable R Ue 9 Hole Peg Test: unable to test R UE Motor  Motor Motor: Hemiplegia;Abnormal postural alignment and control Motor - Skilled Clinical Observations: UE > LE hemi  Trunk/Postural Assessment  Cervical Assessment Cervical Assessment: Within Functional Limits Thoracic Assessment Thoracic Assessment: Exceptions to [facility] Lumbar Assessment Lumbar Assessment: Exceptions to [facility] Postural Control Postural Control: Deficits on evaluation Trunk Control: R trunk lean, posterior bias  Balance Balance Balance Assessed: Yes Static Sitting Balance Static Sitting - Balance Support: Feet supported;No upper extremity supported Static Sitting - Level of Assistance: 5: Stand by assistance Dynamic Sitting Balance Dynamic Sitting - Balance Support: Feet supported;No upper extremity supported;During functional activity Dynamic Sitting - Level of Assistance: 4: Min Oncologist Standing - Balance Support: No upper extremity supported Static Standing - Level of Assistance: 4: Min assist Dynamic Standing Balance Dynamic Standing - Balance Support: No upper extremity supported;During functional activity Dynamic Standing - Level of Assistance: 3: Mod assist Extremity/Trunk Assessment RUE Assessment RUE Assessment: Exceptions to [facility] Active Range of Motion (AROM) Comments: 0-45 General Strength Comments: 2/5 grossly LUE Assessment LUE Assessment: Within Functional Limits  Care Tool Care Tool Self Care Eating   Eating Assist Level: Moderate Assistance - Patient 50 - 74%    Oral Care    Oral Care Assist Level: Minimal Assistance - Patient > 75%    Bathing   Body parts bathed by patient: Right arm;Chest;Abdomen;Front perineal area;Face Body parts bathed by helper: Buttocks;Left lower leg;Right lower  leg;Left upper leg;Right upper leg;Left arm   Assist Level: Maximal Assistance - Patient 24 - 49%    Upper Body Dressing(including orthotics)   What is the patient wearing?: Pull over shirt;Bra   Assist Level: Maximal Assistance - Patient 25 - 49%    Lower Body Dressing (excluding footwear)   What is the patient wearing?: Underwear/pull up;Pants Assist for lower body dressing: Maximal Assistance - Patient 25 - 49%    Putting on/Taking off footwear   What is the patient wearing?: Non-skid slipper socks;Ted hose Assist for footwear: Total Assistance - Patient < 25%       Care Tool Toileting Toileting activity   Assist for toileting: Moderate Assistance - Patient 50 - 74%     Care Tool Bed Mobility Roll left and right activity   Roll left and right assist level: Supervision/Verbal cueing    Sit to lying activity   Sit to lying assist level: Minimal Assistance - Patient > 75%    Lying to sitting on side of bed activity   Lying to sitting on side of bed assist level: the ability to move from lying on the back to sitting on the side of the bed with no back support.: Minimal Assistance - Patient > 75%     Care Tool Transfers Sit to stand transfer   Sit to stand assist level: Minimal Assistance - Patient > 75%    Chair/bed transfer   Chair/bed transfer assist level: Minimal Assistance - Patient > 75%     Toilet transfer   Assist Level: Minimal Assistance - Patient > 75%     Care Tool Cognition  Expression of Ideas and Wants Expression of Ideas and Wants: 3. Some  difficulty - exhibits some difficulty with expressing needs and ideas (e.g, some words or finishing thoughts) or speech is not clear  Understanding Verbal and Non-Verbal Content Understanding Verbal and Non-Verbal Content: 3. Usually understands - understands most conversations, but misses some part/intent of message. Requires cues at times to understand   Memory/Recall Ability Memory/Recall Ability : That he or she  is in a hospital/hospital unit   Refer to Care Plan for Long Term Goals  SHORT TERM GOAL WEEK 1 OT Short Term Goal 1 (Week 1): STG=LTG based on LOS  Recommendations for other services: Neuropsych and Adult nurse group, Stress management, and Outing/community reintegration   Skilled Therapeutic Intervention ADL ADL Equipment Provided: Other (comment) (built up foam for utensil) Eating: Moderate assistance Where Assessed-Eating: Wheelchair Grooming: Moderate assistance Where Assessed-Grooming: Wheelchair Upper Body Bathing: Moderate assistance Where Assessed-Upper Body Bathing: Wheelchair;Sitting at sink Lower Body Bathing: Maximal assistance Where Assessed-Lower Body Bathing: Sitting at sink;Standing at sink;Wheelchair Upper Body Dressing: Maximal assistance Where Assessed-Upper Body Dressing: Sitting at sink Lower Body Dressing: Maximal assistance Where Assessed-Lower Body Dressing: Standing at sink;Sitting at sink;Wheelchair Toileting: Moderate assistance Where Assessed-Toileting: Teacher, adult education: Curator Method: Surveyor, minerals: Grab bars;Bedside commode Tub/Shower Transfer: Moderate assistance Tub/Shower Transfer Method: Stand pivot Tub/Shower Equipment: Insurance underwriter: Insurance underwriter Method: Warden/ranger: Shower seat with back ADL Comments: confused but super sweet, very focussed on grand children and role, UTI +, min A sit to stand and toilet transfer with grab bar, R hemi dom side, thick liq/D1, blurred vision and R inattention, mod A sink sponge UB, max A LB sponge, max A UB/LB dressing except TEDS dep Mobility  Bed Mobility Bed Mobility: Sit to Supine;Supine to Sit Supine to Sit: Minimal Assistance - Patient > 75% Sit to Supine: Minimal Assistance - Patient > 75% Transfers Sit to Stand: Minimal Assistance -  Patient > 75% Stand to Sit: Minimal Assistance - Patient > 75%  OT Treatment/Education:  Pt seen for full initial OT evaluation and training session this am. Pt in w/c with son bedside then left upon OT arrival. OT introduced role of therapy and purpose of session. Pt  open to all presented assessment and training this visit.  OT assisted and assessed ADL's, mobility, vision, sensation. cognition/lang, G/FMC, strength and balance throughout session. See above for levels. Provided built up foam for R hand grasp on utensils. Pt will benefit from skilled OT services at [facility] to maximize function and safety with recommendation to return home with Supervision with BADL's and HHOT services upon d/c home. Pt left at end of session in bed as per pt request due to fatigue with bed alarm set, tray table and nurse call bell within reach.  Discharge Criteria: Patient will be discharged from OT if patient refuses treatment 3 consecutive times without medical reason, if treatment goals not met, if there is a change in medical status, if patient makes no progress towards goals or if patient is discharged from hospital.  The above assessment, treatment plan, treatment alternatives and goals were discussed and mutually agreed upon: by patient and by family  Vicenta Dunning 04/13/2023, 12:57 PM

## 2023-04-13 NOTE — Discharge Summary (Signed)
Physician Discharge Summary  Patient ID: [name] MRN: [id number] DOB/AGE: September 29, 1940 82 y.o.  Admit date: 04/12/2023 Discharge date: 04/21/2023  Discharge Diagnoses:  Principal Problem:   Left middle cerebral artery stroke [facility]) DVT prophylaxis New onset atrial fibrillation Hypertension Hyperlipidemia Dysphagia CAD/left bundle branch block Hyponatremia  Discharged Condition: Stable  Significant Diagnostic Studies: IR PERCUTANEOUS ART THROMBECTOMY/INFUSION INTRACRANIAL INC DIAG ANGIO  Result Date: 04/12/2023 INDICATION: New onset left gaze deviation, right-sided neglect and right hemiparesis. EXAM: 1. EMERGENT LARGE VESSEL OCCLUSION THROMBOLYSIS (anterior CIRCULATION) COMPARISON:  CT angiogram of the head and neck of April 04, 2023. MEDICATIONS: Vancomycin 1 g IV antibiotic was administered within 1 hour of the procedure. ANESTHESIA/SEDATION: General anesthesia. CONTRAST:  Omnipaque 300 approximately 75 mL. FLUOROSCOPY TIME:  Fluoroscopy Time: 50 minutes 6 seconds (1123 mGy). COMPLICATIONS: None immediate. TECHNIQUE: Following a full explanation of the procedure along with the potential associated complications, an informed witnessed consent was obtained. The risks of intracranial hemorrhage of 10%, worsening neurological deficit, ventilator dependency, death and inability to revascularize were all reviewed in detail with the patient's son. The patient was then put under general anesthesia by the Department of Anesthesiology at [facility]. The right groin was prepped and draped in the usual sterile fashion. Thereafter using modified Seldinger technique, transfemoral access into the right common femoral artery was obtained without difficulty. Over an 0.035 inch guidewire an 8 French 25 cm Pinnacle sheath was inserted. Through this, and also over an 0.035 inch guidewire a combination of a 125 cm Simmons 2 6 [place] support catheter inside of an 087 95 cm balloon guide catheter  was advanced to the aortic arch region, and selectively positioned in the proximal left internal carotid artery. The guidewire, and the support catheter were removed. Good aspiration was obtained from the hub of the balloon guide catheter. A gentle control arteriogram was then performed centered extra cranially and intracranially. FINDINGS: The left common carotid arteriogram demonstrates the left external carotid artery and its major branches to be widely patent. The left internal carotid artery at the bulb to the cranial skull base is widely patent. The petrous, the cavernous and the supraclinoid left ICA are widely patent. Left anterior cerebral artery demonstrates approximately 50% stenosis at its origin. More distally, the vessel opacifies normally into the capillary and venous phases. The left middle cerebral artery demonstrates complete occlusion distal to the origin of the anterior temporal branch. PROCEDURE: Through the balloon guide catheter in the proximal 1/3 of the left ICA, a combination of an 071 132 cm Zoom aspiration catheter with a Phenom microcatheter was advanced over an 018 inch micro guidewire with a moderate J configuration to the supraclinoid left ICA. The micro guidewire was then gently advanced through the occluded segment into the inferior division of the left MCA distal M2 region followed by the microcatheter the guidewire was removed. Good aspiration was obtained from the hub of the microcatheter. A 4 mm x 40 mm Solitaire X retrieval device was then advanced to the distal end of the microcatheter and deployed in the usual manner such that the proximal portion of the device was proximal to the occluded MCA. The Zoom aspiration catheter was advanced and engaged into the proximal portion of the occlusion of the left ICA. Constant aspiration was performed at the hub of the 071 Zoom aspiration catheter for approximately 2 minutes. Proximal flow arrest in the left internal carotid artery,  constant aspiration at the hub of the balloon guide catheter, the combination of  the retrieval device, the microcatheter, and the Zoom aspiration catheter was retrieved and removed. A control arteriogram performed following reversal of flow arrest demonstrated no significant change in the occluded left MCA. A second pass was then made again using the above microcatheter and aspiration advanced over an 018 inch micro guidewire to the proximal left MCA. The guidewire, and the microcatheter were then advanced into an inferior division of the left MCA M2 region followed by the microcatheter. The guidewire was removed. A 4 x 40 mm Solitaire X retrieval device was deployed in the usual manner as described above. The 071 Zoom aspiration catheter was advanced to engage the occluded left MCA distally. Aspiration was then applied at the hub of the 071 Zoom aspiration catheter for a minute and a half. Following this the combination of the retrieval device, the microcatheter and the Zoom aspiration catheter were retrieved and removed with proximal flow arrest. A control arteriogram performed following reversal of flow arrest in the left ICA demonstrated complete revascularization of the occluded left middle cerebral artery distally achieving a TICI 3 revascularization. Moderate spasm of the anterior temporal branch responded to 5 aliquots of 25 mcg of nitroglycerin intra-arterially in addition to 2.5 mg of verapamil. A final control arteriogram performed through the balloon guide catheter in the proximal ICA continued to demonstrate patency of the left ICA intracranially and extra cranially. The left MCA demonstrated a TICI 3 revascularization. No change was seen in the left anterior cerebral artery. A flat panel CT of the brain demonstrated no evidence of hemorrhagic complications. An 8 French Angio-Seal closure device was deployed at the femoral puncture site for hemostasis. Distal pulses remained present unchanged in both  feet at the end of procedure. The patient was then extubated. The patient recovered to where she was able to follow simple commands intermittently. The right lower extremity showed movement. No movement was noted of the right upper extremity. Patient was then transferred to the neuro ICU for post revascularization care. IMPRESSION: Status post revascularization of occluded left middle cerebral artery M1 segment with a 4 mm x 40 mm Solitaire X retrieval device and contact aspiration achieving a TICI 3 revascularization. PLAN: As per referring MD. Electronically Signed   By: Julieanne Cotton M.D.   On: 04/12/2023 08:04   IR CT Head Ltd  Result Date: 04/12/2023 INDICATION: New onset left gaze deviation, right-sided neglect and right hemiparesis. EXAM: 1. EMERGENT LARGE VESSEL OCCLUSION THROMBOLYSIS (anterior CIRCULATION) COMPARISON:  CT angiogram of the head and neck of April 04, 2023. MEDICATIONS: Vancomycin 1 g IV antibiotic was administered within 1 hour of the procedure. ANESTHESIA/SEDATION: General anesthesia. CONTRAST:  Omnipaque 300 approximately 75 mL. FLUOROSCOPY TIME:  Fluoroscopy Time: 50 minutes 6 seconds (1123 mGy). COMPLICATIONS: None immediate. TECHNIQUE: Following a full explanation of the procedure along with the potential associated complications, an informed witnessed consent was obtained. The risks of intracranial hemorrhage of 10%, worsening neurological deficit, ventilator dependency, death and inability to revascularize were all reviewed in detail with the patient's son. The patient was then put under general anesthesia by the Department of Anesthesiology at [facility]. The right groin was prepped and draped in the usual sterile fashion. Thereafter using modified Seldinger technique, transfemoral access into the right common femoral artery was obtained without difficulty. Over an 0.035 inch guidewire an 8 French 25 cm Pinnacle sheath was inserted. Through this, and also over an  0.035 inch guidewire a combination of a 125 cm Simmons 2 6 [place] support catheter  inside of an 087 95 cm balloon guide catheter was advanced to the aortic arch region, and selectively positioned in the proximal left internal carotid artery. The guidewire, and the support catheter were removed. Good aspiration was obtained from the hub of the balloon guide catheter. A gentle control arteriogram was then performed centered extra cranially and intracranially. FINDINGS: The left common carotid arteriogram demonstrates the left external carotid artery and its major branches to be widely patent. The left internal carotid artery at the bulb to the cranial skull base is widely patent. The petrous, the cavernous and the supraclinoid left ICA are widely patent. Left anterior cerebral artery demonstrates approximately 50% stenosis at its origin. More distally, the vessel opacifies normally into the capillary and venous phases. The left middle cerebral artery demonstrates complete occlusion distal to the origin of the anterior temporal branch. PROCEDURE: Through the balloon guide catheter in the proximal 1/3 of the left ICA, a combination of an 071 132 cm Zoom aspiration catheter with a Phenom microcatheter was advanced over an 018 inch micro guidewire with a moderate J configuration to the supraclinoid left ICA. The micro guidewire was then gently advanced through the occluded segment into the inferior division of the left MCA distal M2 region followed by the microcatheter the guidewire was removed. Good aspiration was obtained from the hub of the microcatheter. A 4 mm x 40 mm Solitaire X retrieval device was then advanced to the distal end of the microcatheter and deployed in the usual manner such that the proximal portion of the device was proximal to the occluded MCA. The Zoom aspiration catheter was advanced and engaged into the proximal portion of the occlusion of the left ICA. Constant aspiration was performed at the  hub of the 071 Zoom aspiration catheter for approximately 2 minutes. Proximal flow arrest in the left internal carotid artery, constant aspiration at the hub of the balloon guide catheter, the combination of the retrieval device, the microcatheter, and the Zoom aspiration catheter was retrieved and removed. A control arteriogram performed following reversal of flow arrest demonstrated no significant change in the occluded left MCA. A second pass was then made again using the above microcatheter and aspiration advanced over an 018 inch micro guidewire to the proximal left MCA. The guidewire, and the microcatheter were then advanced into an inferior division of the left MCA M2 region followed by the microcatheter. The guidewire was removed. A 4 x 40 mm Solitaire X retrieval device was deployed in the usual manner as described above. The 071 Zoom aspiration catheter was advanced to engage the occluded left MCA distally. Aspiration was then applied at the hub of the 071 Zoom aspiration catheter for a minute and a half. Following this the combination of the retrieval device, the microcatheter and the Zoom aspiration catheter were retrieved and removed with proximal flow arrest. A control arteriogram performed following reversal of flow arrest in the left ICA demonstrated complete revascularization of the occluded left middle cerebral artery distally achieving a TICI 3 revascularization. Moderate spasm of the anterior temporal branch responded to 5 aliquots of 25 mcg of nitroglycerin intra-arterially in addition to 2.5 mg of verapamil. A final control arteriogram performed through the balloon guide catheter in the proximal ICA continued to demonstrate patency of the left ICA intracranially and extra cranially. The left MCA demonstrated a TICI 3 revascularization. No change was seen in the left anterior cerebral artery. A flat panel CT of the brain demonstrated no evidence of hemorrhagic complications. An 8  French  Angio-Seal closure device was deployed at the femoral puncture site for hemostasis. Distal pulses remained present unchanged in both feet at the end of procedure. The patient was then extubated. The patient recovered to where she was able to follow simple commands intermittently. The right lower extremity showed movement. No movement was noted of the right upper extremity. Patient was then transferred to the neuro ICU for post revascularization care. IMPRESSION: Status post revascularization of occluded left middle cerebral artery M1 segment with a 4 mm x 40 mm Solitaire X retrieval device and contact aspiration achieving a TICI 3 revascularization. PLAN: As per referring MD. Electronically Signed   By: Julieanne Cotton M.D.   On: 04/12/2023 08:04   MR BRAIN WO CONTRAST  Result Date: 04/05/2023 CLINICAL DATA:  Stroke, follow up EXAM: MRI HEAD WITHOUT CONTRAST TECHNIQUE: Multiplanar, multiecho pulse sequences of the brain and surrounding structures were obtained without intravenous contrast. COMPARISON:  CT head 04/04/2023 FINDINGS: Brain: Acute left MCA territory infarct involving the insula, left temporal lobe in the corona radiata. There is a additional punctate acute infarcts in the right parietal lobe. There is susceptibility artifact in the anterior left temporal lobe, favored to represent a small petechial hemorrhage. No mass effect. No mass lesion. No hydrocephalus. No extra-axial fluid collection Vascular: Normal flow voids. Skull and upper cervical spine: Normal marrow signal. Sinuses/Orbits: No middle ear or mastoid effusion. Paranasal sinuses are clear Other: None. IMPRESSION: 1. Acute left MCA territory infarct involving the insula, left temporal lobe and corona radiata. Susceptibility artifact in the anterior left temporal lobe, favored to represent a small petechial hemorrhage. No mass effect. 2. Additional punctate embolic acute infarcts in the right parietal lobe. These results will be called  to the ordering clinician or representative by the Radiologist Assistant, and communication documented in the PACS or [employer]. Electronically Signed   By: Lorenza Cambridge M.D.   On: 04/05/2023 12:06   ECHOCARDIOGRAM COMPLETE  Result Date: 04/05/2023    ECHOCARDIOGRAM REPORT   Patient Name:   [name] Date of Exam: 04/05/2023 Medical Rec #:  [id number]        Height:       65.0 in Accession #:    [id number]       Weight:       141.1 lb Date of Birth:  November 22, 1940         BSA:          1.706 m Patient Age:    82 years         BP:           159/62 mmHg Patient Gender: F                HR:           64 bpm. Exam Location:  Inpatient Procedure: 2D Echo, Cardiac Doppler and Color Doppler Indications:    Stroke I63.9  History:        Patient has prior history of Echocardiogram examinations, most                 recent 03/25/2017. Angina and CAD, Stroke, Arrythmias:LBBB,                 Signs/Symptoms:Chest Pain; Risk Factors:Hypertension and                 Dyslipidemia.  Sonographer:    Lucendia Herrlich Referring Phys: Kalman Drape WOLFE IMPRESSIONS  1. Left ventricular ejection fraction, by estimation,  is 45 to 50%. The left ventricle has mildly decreased function. The left ventricle demonstrates global hypokinesis. Left ventricular diastolic function could not be evaluated.  2. Right ventricular systolic function is normal. The right ventricular size is normal.  3. Left atrial size was mildly dilated.  4. Right atrial size was mildly dilated.  5. The mitral valve is normal in structure. Trivial mitral valve regurgitation. No evidence of mitral stenosis.  6. The aortic valve is tricuspid. There is mild calcification of the aortic valve. Aortic valve regurgitation is not visualized. No aortic stenosis is present.  7. The inferior vena cava is normal in size with <50% respiratory variability, suggesting right atrial pressure of 8 mmHg. FINDINGS  Left Ventricle: Left ventricular ejection fraction, by  estimation, is 45 to 50%. The left ventricle has mildly decreased function. The left ventricle demonstrates global hypokinesis. The left ventricular internal cavity size was normal in size. There is  no left ventricular hypertrophy. Left ventricular diastolic function could not be evaluated due to atrial fibrillation. Left ventricular diastolic function could not be evaluated. Right Ventricle: The right ventricular size is normal. No increase in right ventricular wall thickness. Right ventricular systolic function is normal. Left Atrium: Left atrial size was mildly dilated. Right Atrium: Right atrial size was mildly dilated. Pericardium: There is no evidence of pericardial effusion. Mitral Valve: The mitral valve is normal in structure. Trivial mitral valve regurgitation. No evidence of mitral valve stenosis. Tricuspid Valve: The tricuspid valve is normal in structure. Tricuspid valve regurgitation is mild . No evidence of tricuspid stenosis. Aortic Valve: The aortic valve is tricuspid. There is mild calcification of the aortic valve. Aortic valve regurgitation is not visualized. No aortic stenosis is present. Aortic valve peak gradient measures 9.9 mmHg. Pulmonic Valve: The pulmonic valve was normal in structure. Pulmonic valve regurgitation is trivial. No evidence of pulmonic stenosis. Aorta: The aortic root is normal in size and structure. Venous: The inferior vena cava is normal in size with less than 50% respiratory variability, suggesting right atrial pressure of 8 mmHg. IAS/Shunts: No atrial level shunt detected by color flow Doppler.  LEFT VENTRICLE PLAX 2D LVIDd:         5.00 cm   Diastology LVIDs:         3.70 cm   LV e' medial:    7.51 cm/s LV PW:         0.80 cm   LV E/e' medial:  14.2 LV IVS:        0.80 cm   LV e' lateral:   11.00 cm/s LVOT diam:     1.90 cm   LV E/e' lateral: 9.7 LV SV:         54 LV SV Index:   31 LVOT Area:     2.84 cm  RIGHT VENTRICLE            IVC RV S prime:     9.25 cm/s  IVC  diam: 2.00 cm TAPSE (M-mode): 1.1 cm LEFT ATRIUM             Index        RIGHT ATRIUM           Index LA diam:        4.10 cm 2.40 cm/m   RA Area:     16.20 cm LA Vol (A2C):   43.3 ml 25.39 ml/m  RA Volume:   41.90 ml  24.57 ml/m LA Vol (A4C):   54.3 ml 31.84  ml/m LA Biplane Vol: 51.0 ml 29.90 ml/m  AORTIC VALVE                 PULMONIC VALVE AV Area (Vmax): 1.82 cm     PR End Diast Vel: 7.51 msec AV Vmax:        157.00 cm/s AV Peak Grad:   9.9 mmHg LVOT Vmax:      100.65 cm/s LVOT Vmean:     63.475 cm/s LVOT VTI:       0.189 m  AORTA Ao Root diam: 2.90 cm Ao Asc diam:  3.30 cm MITRAL VALVE                TRICUSPID VALVE MV Area (PHT): 5.02 cm     TR Peak grad:   42.5 mmHg MV Decel Time: 151 msec     TR Vmax:        326.00 cm/s MR Peak grad: 52.0 mmHg MR Vmax:      360.50 cm/s   SHUNTS MV E velocity: 107.00 cm/s  Systemic VTI:  0.19 m MV A velocity: 39.00 cm/s   Systemic Diam: 1.90 cm MV E/A ratio:  2.74 Arvilla Meres MD Electronically signed by Arvilla Meres MD Signature Date/Time: 04/05/2023/10:28:09 AM    Final    CT HEAD WO CONTRAST ( )  Result Date: 04/04/2023 CLINICAL DATA:  Stroke, follow up EXAM: CT HEAD WITHOUT CONTRAST TECHNIQUE: Contiguous axial images were obtained from the base of the skull through the vertex without intravenous contrast. RADIATION DOSE REDUCTION: This exam was performed according to the departmental dose-optimization program which includes automated exposure control, adjustment of the mA and/or kV according to patient size and/or use of iterative reconstruction technique. COMPARISON:  CT Head October 20, 24. FINDINGS: Motion limited study.  Within this limitation: Brain: No evidence of acute large vascular territory infarction, hemorrhage, hydrocephalus, extra-axial collection or mass lesion/mass effect. Patchy white matter hypodensities, nonspecific but compatible with chronic microvascular ischemic disease. Vascular: No hyperdense vessel.  Calcific  atherosclerosis. Skull: No acute fracture. Sinuses/Orbits: Clear sinuses.  No acute orbital findings. IMPRESSION: Motion limited study without evidence of acute intracranial abnormality. Electronically Signed   By: Feliberto Harts M.D.   On: 04/04/2023 12:58   DG Chest Portable 1 View  Result Date: 04/04/2023 CLINICAL DATA:  stroke EXAM: PORTABLE CHEST 1 VIEW COMPARISON:  Chest x-ray dated April 24, 2020 FINDINGS: Mild cardiomegaly. Diffuse interstitial opacities. No evidence of pleural effusion or pneumothorax. IMPRESSION: Diffuse interstitial opacities, likely due to pulmonary edema. Electronically Signed   By: Allegra Lai M.D.   On: 04/04/2023 12:04   CT ANGIO HEAD NECK W WO CM W PERF (CODE STROKE)  Result Date: 04/04/2023 CLINICAL DATA:  Right-sided weakness EXAM: CT ANGIOGRAPHY HEAD AND NECK CT PERFUSION BRAIN TECHNIQUE: Multidetector CT imaging of the head and neck was performed using the standard protocol during bolus administration of intravenous contrast. Multiplanar CT image reconstructions and MIPs were obtained to evaluate the vascular anatomy. Carotid stenosis measurements (when applicable) are obtained utilizing NASCET criteria, using the distal internal carotid diameter as the denominator. Multiphase CT imaging of the brain was performed following IV bolus contrast injection. Subsequent parametric perfusion maps were calculated using RAPID software. RADIATION DOSE REDUCTION: This exam was performed according to the departmental dose-optimization program which includes automated exposure control, adjustment of the mA and/or kV according to patient size and/or use of iterative reconstruction technique. CONTRAST:  OMNIPAQUE IOHEXOL 350 MG/ML SOLN COMPARISON:  Earlier today FINDINGS: CTA NECK  FINDINGS Aortic arch: Atherosclerosis. Right carotid system: Mixed density plaque at the bifurcation and bulb. No flow reducing stenosis or ulceration Left carotid system: Atheromatous  plaque greatest at the bifurcation. No flow reducing stenosis or ulceration. Vertebral arteries: No proximal subclavian stenosis. Atheromatous narrowing of the proximal right vertebral artery causing at least 50% stenosis. No ulceration or beading. Skeleton: No acute finding Other neck: No acute finding Upper chest: Interlobular septal thickening at the apices. Review of the MIP images confirms the above findings CTA HEAD FINDINGS Anterior circulation: Left M1 occlusion with abrupt cut off consistent with emergent embolic disease. There is downstream underfilling. Atheromatous plaque affects the carotid siphons without flow reducing stenosis. Negative for aneurysm Posterior circulation: Fenestrated proximal basilar. The vertebral and basilar arteries are diffusely patent. No branch occlusion, beading, or vascular malformation. Atheromatous irregularity of the posterior cerebral arteries with moderate narrowing at the left P2 segment. 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 Venous sinuses: Diffusely patent Anatomic variants: None significant Review of the MIP images confirms the above findings CT Brain Perfusion Findings: ASPECTS: 10 CBF (<30%) Volume: 0mL Perfusion (Tmax>6.0s) volume: Case discussed with Dr. Otelia Limes while study was in progress. IMPRESSION: 1. Emergent large vessel occlusion at the left M1 segment. There is 146 cc of penumbra and no core infarct by CT perfusion. 2. No flow reducing stenosis or embolic source seen underlying the embolus. 3. Atherosclerosis with flow reducing stenosis at the right vertebral origin. Notable atheromatous irregularity of the posterior cerebral arteries. 4. 2 mm left superior cerebellar aneurysm. 5. Interstitial edema in the apical lungs. Electronically Signed   By: Tiburcio Pea M.D.   On: 04/04/2023 11:00   CT HEAD CODE STROKE WO CONTRAST  Result Date: 04/04/2023 CLINICAL DATA:  Code stroke. Right upper extremity weakness. Aphasia. EXAM: CT HEAD  WITHOUT CONTRAST TECHNIQUE: Contiguous axial images were obtained from the base of the skull through the vertex without intravenous contrast. RADIATION DOSE REDUCTION: This exam was performed according to the departmental dose-optimization program which includes automated exposure control, adjustment of the mA and/or kV according to patient size and/or use of iterative reconstruction technique. COMPARISON:  None Available. FINDINGS: Brain: No evidence of acute infarction, hemorrhage, hydrocephalus, extra-axial collection or mass lesion/mass effect. Mild generalized brain atrophy. Mild chronic small vessel ischemia in the deep cerebral white matter. Vascular: No hyperdense vessel or unexpected calcification. Skull: Normal. Negative for fracture or focal lesion. Sinuses/Orbits: No acute finding. Other: Prelim sent in epic chat. ASPECTS [facility] Stroke Program Early CT Score) - Ganglionic level infarction (caudate, lentiform nuclei, internal capsule, insula, M1-M3 cortex): 7 - Supraganglionic infarction (M4-M6 cortex): 3 Total score (0-10 with 10 being normal): 10 IMPRESSION: Aging brain without acute finding. Electronically Signed   By: Tiburcio Pea M.D.   On: 04/04/2023 10:22    Labs:  Basic Metabolic Panel: Recent Labs  Lab 04/20/23 1602  NA 128*  K 4.0  CL 91*  CO2 24  GLUCOSE 134*  BUN 7*  CREATININE 0.66  CALCIUM 9.8  MG 1.8     CBC: Recent Labs  Lab 04/20/23 1602  WBC 8.2  HGB 13.3  HCT 37.6  MCV 82.5  PLT 299     CBG: No results for input(s): "GLUCAP" in the last 168 hours.  Family history.  Mother with cancer father with myocardial infarction.  Denies any colon cancer esophageal cancer or rectal cancer  Brief HPI:   [name] is a 82 y.o. right-handed female with history of CAD/left  bundle branch block maintained on low-dose aspirin, hypertension and hyperlipidemia.  Per chart review patient lives with son and independent without assistive device performing all  ADLs and driving at baseline prior to admission.  Presented 04/04/2023 to [facility] with acute onset of right sided weakness facial droop and aphasia.  Admission chemistries unremarkable troponin negative.  Cranial CT scan showed aging brain without acute findings.  CTA showed emergent large vessel occlusion of the left M1 segment.  2 mm left superior cerebellar aneurysm.  Not a candidate for TNK.  Patient was transferred to [facility] underwent revascularization mechanical thrombectomy per interventional radiology.  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  No mass effect.  Additional punctate embolic acute infarcts in the right parietal lobe.  Echocardiogram ejection fraction of 45 to 50%.  The left ventricle demonstrating global hypokinesis.  Hospital course new onset atrial fibrillation initially on aspirin transition to Eliquis.  Currently on a dysphagia #1 nectar thick liquid diet.  Therapy evaluations completed due to patient decreased functional mobility right side weakness aphasia/dysphagia was admitted for a comprehensive rehab program.   Hospital Course: [name] was admitted to rehab 04/12/2023 for inpatient therapies to consist of PT, ST and OT at least three hours five days a week. Past admission physiatrist, therapy team and rehab RN have worked together to provide customized collaborative inpatient rehab.  Pertaining to patient's left MCA territory infarction with left M1 occlusion status post revascularization.  Patient would remain on Eliquis therapy and follow-up with neurology as well as interventional radiology.  New onset atrial fibrillation cardiac rate controlled follow-up outpatient cardiology services no chest pain or shortness of breath continue Eliquis.  No bleeding episodes.  Blood pressure was a bit soft as well as patient did develop some hyponatremia 128 felt possibly due to  clonidine thus this was discontinued and she remained on 50 mg of Cozaar and would need outpatient follow-up.  Her follow-up sodium level improved to 134.  Urine study was completed due to some mild confusion showed to be negative nitrite with moderate leukocytes was placed on Keflex with culture pending.  Lipitor ongoing for hyperlipidemia.  Speech therapy continue to follow for dysphagia advancing diet as tolerated.   Blood pressures were monitored on TID basis and soft and monitored    Rehab course: During patient's stay in rehab weekly team conferences were held to monitor patient's progress, set goals and discuss barriers to discharge. At admission, patient required minimal assist 200 feet rolling walker minimal assist step pivot transfers  Physical exam.  Blood pressure 138/66 pulse 95 temperature 97.8 respirations 18 oxygen saturation is 100% room air Constitutional.  No acute distress HEENT Head.  Normocephalic and atraumatic Eyes.  Pupils round and reactive to light no discharge without nystagmus Neck.  Supple nontender no JVD without thyromegaly Cardiac regular rate and rhythm without any extra sounds or murmur heard Abdomen.  Soft nontender positive bowel sounds without rebound Respiratory effort normal no respiratory distress without wheeze Extremities.  No clubbing cyanosis or edema Musculoskeletal.  Cranial nerves II through XII intact motor strength 5/5 in left deltoid bicep tricep grip, 5/5 bilateral hip flexors, knee extensors ankle dorsi plantarflexion Right upper extremity 1+ grip 1+bi, 2/5 tricep 0/5 deltoid Sensory exam normal Neurologic.  Alert makes eye contact with examiner.  Follows simple commands.  She can name hospital and family members but not age or time.  He/She  has  had improvement in activity tolerance, balance, postural control as well as ability to compensate for deficits. He/She has had improvement in functional use RUE/LUE  and RLE/LLE as well as  improvement in awareness.  Patient able to come the edge of bed with minimal assist.  Transferred edge of bed to wheelchair using rolling walker for additional balance for stand pivot transfers.  She was able to doff her lower body garments with minimal assist.  She was able to complete hygiene of her perineal and bottom with set up assist.  Minimal assist using grab bars transported to the sink able to wash her hands with set up.  Minimal assist for managing bilateral lower extremities.  Patient could perform multiple sit to stand pivot transfers with minimal assist and verbal cues.  Ambulates 140 feet x 3 with no device and minimal assist for verbal cues.  Perform step taps 3 sets 10 repetitions each.  Followed by speech therapy for dysphagia and education provided.  Full family teaching completed plan discharge to home.       Disposition: Discharge to home    Diet: Dysphagia #1 thin liquids  Special Instructions: No driving smoking or alcohol  Medications at discharge. 1.  Tylenol as needed 2.  Eliquis 5 mg p.o. twice daily 3.  Lipitor 80 mg p.o. daily 4.  Cozaar 50 mg daily 5.  Melatonin 3 mg daily 6.Metanx one tab daily 7.Cholecalciferol 5000 units daily 8.  Lidoderm patch changes directed 9.  Keflex 500 mg every 12 hours x 6 days   30-35 minutes were spent completing discharge summary and discharge planning  Discharge Instructions     Ambulatory referral to Neurology   Complete by: As directed    An appointment is requested in approximately: 4 weeks left MCA infarction   Ambulatory referral to Physical Medicine Rehab   Complete by: As directed    Moderate complexity follow-up 1 to 2 weeks left MCA infarction        Follow-up Information     Raulkar, Drema Pry, MD Follow up.   Specialty: Physical Medicine and Rehabilitation Why: Office to call for appointment Contact information: [address] [place] [address] [phone]         Sande Rives, MD Follow up.   Specialties: Cardiology, Internal Medicine, Radiology Why: Call for appointment Contact information: [address] [place] [address] [phone]         Julieanne Cotton, MD Follow up.   Specialties: Interventional Radiology, Radiology Why: Call for appointment Contact information: [address] [place] [address] [phone]                 Signed: Mcarthur Rossetti Elmina Hendel 04/21/2023, 5:11 AM

## 2023-04-13 NOTE — Plan of Care (Signed)
  Problem: RH Balance Goal: LTG Patient will maintain dynamic standing balance (PT) Description: LTG:  Patient will maintain dynamic standing balance with assistance during mobility activities (PT) Flowsheets (Taken 04/13/2023 1209) LTG: Pt will maintain dynamic standing balance during mobility activities with:: Supervision/Verbal cueing   Problem: Sit to Stand Goal: LTG:  Patient will perform sit to stand with assistance level (PT) Description: LTG:  Patient will perform sit to stand with assistance level (PT) Flowsheets (Taken 04/13/2023 1209) LTG: PT will perform sit to stand in preparation for functional mobility with assistance level: Supervision/Verbal cueing   Problem: RH Bed Mobility Goal: LTG Patient will perform bed mobility with assist (PT) Description: LTG: Patient will perform bed mobility with assistance, with/without cues (PT). Flowsheets (Taken 04/13/2023 1209) LTG: Pt will perform bed mobility with assistance level of: Supervision/Verbal cueing   Problem: RH Bed to Chair Transfers Goal: LTG Patient will perform bed/chair transfers w/assist (PT) Description: LTG: Patient will perform bed to chair transfers with assistance (PT). Flowsheets (Taken 04/13/2023 1209) LTG: Pt will perform Bed to Chair Transfers with assistance level: Supervision/Verbal cueing   Problem: RH Car Transfers Goal: LTG Patient will perform car transfers with assist (PT) Description: LTG: Patient will perform car transfers with assistance (PT). Flowsheets (Taken 04/13/2023 1209) LTG: Pt will perform car transfers with assist:: Contact Guard/Touching assist   Problem: RH Ambulation Goal: LTG Patient will ambulate in controlled environment (PT) Description: LTG: Patient will ambulate in a controlled environment, # of feet with assistance (PT). Flowsheets (Taken 04/13/2023 1209) LTG: Pt will ambulate in controlled environ  assist needed:: Supervision/Verbal cueing LTG: Ambulation distance in  controlled environment: 111ft Goal: LTG Patient will ambulate in home environment (PT) Description: LTG: Patient will ambulate in home environment, # of feet with assistance (PT). Flowsheets (Taken 04/13/2023 1209) LTG: Pt will ambulate in home environ  assist needed:: Supervision/Verbal cueing LTG: Ambulation distance in home environment: 9ft   Problem: RH Stairs Goal: LTG Patient will ambulate up and down stairs w/assist (PT) Description: LTG: Patient will ambulate up and down # of stairs with assistance (PT) Flowsheets (Taken 04/13/2023 1209) LTG: Pt will ambulate up/down stairs assist needed:: Contact Guard/Touching assist LTG: Pt will  ambulate up and down number of stairs: 4 with 1 railing or as per home setup

## 2023-04-13 NOTE — Progress Notes (Signed)
PROGRESS NOTE   Subjective/Complaints: No new complaints this morning Walking well with Ephriam Knuckles! Discussed labs, stopping Tylenol given elevated LFTs  ROS: has minimal pain   Objective:   No results found. Recent Labs    04/11/23 0558 04/13/23 0532  WBC 5.3 5.0  HGB 11.5* 11.9*  HCT 34.6* 36.4  PLT 170 182   Recent Labs    04/11/23 0558 04/13/23 0532  NA 137 138  K 4.3 4.0  CL 104 102  CO2 25 25  GLUCOSE 113* 107*  BUN 15 14  CREATININE 0.79 0.86  CALCIUM 9.1 9.3    Intake/Output Summary (Last 24 hours) at 04/13/2023 1027 Last data filed at 04/13/2023 0700 Gross per 24 hour  Intake 170 ml  Output --  Net 170 ml        Physical Exam: Vital Signs Blood pressure (!) 148/62, pulse (!) 59, temperature 97.7 F (36.5 C), temperature source Oral, resp. rate 16, height 5\' 4"  (1.626 m), weight 57.4 kg, SpO2 100%. Gen: no distress, normal appearing HEENT: oral mucosa pink and moist, NCAT Cardio: Reg rate Chest: normal effort, normal rate of breathing Abd: soft, non-distended Ext: no edema Psych: pleasant, normal affect Skin: intact Neurologic: Cranial nerves II through XII intact, motor strength is 5/5 in Left  deltoid, bicep, tricep, grip, 5/5 Bilateral hip flexor, knee extensors, ankle dorsiflexor and plantar flexor RUE 1+ grip, 1+ bi, 2/5 tri, 0/5 delt Sensory exam normal sensation to light touch and proprioception in bilateral upper and lower extremities Cerebellar exam Limited RUE due to weakness Musculoskeletal: Full range of motion in all 4 extremities. No joint swelling. Patient is alert and makes eye contact with examiner.  She follows simple commands.  She can name hospital and family members but not age or time.   speech without dysarthria or aphasia     Assessment/Plan: 1. Functional deficits which require 3+ hours per day of interdisciplinary therapy in a comprehensive inpatient rehab  setting. Physiatrist is providing close team supervision and 24 hour management of active medical problems listed below. Physiatrist and rehab team continue to assess barriers to discharge/monitor patient progress toward functional and medical goals  Care Tool:  Bathing              Bathing assist       Upper Body Dressing/Undressing Upper body dressing        Upper body assist      Lower Body Dressing/Undressing Lower body dressing            Lower body assist       Toileting Toileting    Toileting assist       Transfers Chair/bed transfer  Transfers assist     Chair/bed transfer assist level: Minimal Assistance - Patient > 75%     Locomotion Ambulation   Ambulation assist      Assist level: Minimal Assistance - Patient > 75% Assistive device: No Device Max distance: 122ft   Walk 10 feet activity   Assist     Assist level: Minimal Assistance - Patient > 75% Assistive device: No Device   Walk 50 feet activity   Assist    Assist level:  Minimal Assistance - Patient > 75% Assistive device: No Device    Walk 150 feet activity   Assist    Assist level: Minimal Assistance - Patient > 75% Assistive device: No Device    Walk 10 feet on uneven surface  activity   Assist     Assist level: Minimal Assistance - Patient > 75%     Wheelchair     Assist Is the patient using a wheelchair?: No             Wheelchair 50 feet with 2 turns activity    Assist            Wheelchair 150 feet activity     Assist          Blood pressure (!) 148/62, pulse (!) 59, temperature 97.7 F (36.5 C), temperature source Oral, resp. rate 16, height 5\' 4"  (1.626 m), weight 57.4 kg, SpO2 100%.  Medical Problem List and Plan: 1. Functional deficits secondary to left MCA territory infarction with left M1 occlusion status post revascularization             -patient may shower             -ELOS/Goals: 10-14d Sup Mobility  and ADLs  2.  Antithrombotics: -DVT/anticoagulation:  Pharmaceutical: Eliquis             -antiplatelet therapy: N/A 3. Pain Management: Tylenol as needed 4. Mood/Behavior/Sleep: Provide emotional support             -antipsychotic agents: N/A 5. Neuropsych/cognition: This patient is not capable of making decisions on her own behalf. 6. Skin/Wound Care: Routine skin checks 7. Fluids/Electrolytes/Nutrition: Routine in and outs with follow-up chemistries 8.  New onset atrial fibrillation.  Continue Eliquis.  Cardiac rate controlled 9.  Hypertension.  Clonidine 0.1 mg twice daily, Cozaar 100 mg daily.  Monitor with increased mobility 10.  Hyperlipidemia.  Lipitor  11.  Dysphagia.  Dysphagia #1 nectar liquid.  12. Bradycardia: continue to monitor HR TID  13. Screening for vitamin D deficiency: add on Vitamin D level today  14. Hyperglycemia: d/c Ensure and made goal to focus on eating regular meals  15. Transaminitis: d/c Tylenol    LOS: 1 days A FACE TO FACE EVALUATION WAS PERFORMED  Clint Bolder P Mattisen Pohlmann 04/13/2023, 10:27 AM

## 2023-04-13 NOTE — Progress Notes (Signed)
Inpatient Rehabilitation Admission Medication Review by a Pharmacist   A complete drug regimen review was completed for this patient to identify any potential clinically significant medication issues.   High Risk Drug Classes Is patient taking? Indication by Medication  Antipsychotic No    Anticoagulant Yes Eliquis - afib  Antibiotic No    Opioid No    Antiplatelet No    Hypoglycemics/insulin No    Vasoactive Medication Yes Clonidine - HTN Losartan - HTN  Chemotherapy No    Other Yes Tylenol - PRN pain Lipitor - HDL          Type of Medication Issue Identified Description of Issue Recommendation(s)  Drug Interaction(s) (clinically significant)        Duplicate Therapy        Allergy        No Medication Administration End Date        Incorrect Dose        Additional Drug Therapy Needed        Significant med changes from prior encounter (inform family/care partners about these prior to discharge).     Other            Clinically significant medication issues were identified that warrant physician communication and completion of prescribed/recommended actions by midnight of the next day:  No    Pharmacist comments: n/a   Time spent performing this drug regimen review (minutes):  20 minutes     Toniann Fail Peta Peachey 04/12/2023 3:13 PM

## 2023-04-13 NOTE — Plan of Care (Signed)
  Problem: RH Cognition - SLP Goal: RH LTG Patient will demonstrate orientation with cues Description:  LTG:  Patient will demonstrate orientation to person/place/time/situation with cues (SLP)   Flowsheets (Taken 04/13/2023 1557) LTG Patient will demonstrate orientation to:  Person  Place  Situation  Time LTG: Patient will demonstrate orientation using cueing (SLP): Moderate Assistance - Patient 50 - 74%   Problem: RH Expression Communication Goal: LTG Patient will increase speech intelligibility (SLP) Description: LTG: Patient will increase speech intelligibility at word/phrase/conversation level with cues, % of the time (SLP) Flowsheets (Taken 04/13/2023 1557) LTG: Patient will increase speech intelligibility (SLP): Minimal Assistance - Patient > 75% Level: Phrase   Problem: RH Problem Solving Goal: LTG Patient will demonstrate problem solving for (SLP) Description: LTG:  Patient will demonstrate problem solving for basic/complex daily situations with cues  (SLP) Flowsheets (Taken 04/13/2023 1557) LTG: Patient will demonstrate problem solving for (SLP): Basic daily situations LTG Patient will demonstrate problem solving for: Moderate Assistance - Patient 50 - 74%   Problem: RH Memory Goal: LTG Patient will use memory compensatory aids to (SLP) Description: LTG:  Patient will use memory compensatory aids to recall biographical/new, daily complex information with cues (SLP) Flowsheets (Taken 04/13/2023 1557) LTG: Patient will use memory compensatory aids to (SLP): Maximal Assistance - Patient 25 - 49%   Problem: RH Attention Goal: LTG Patient will demonstrate this level of attention during functional activites (SLP) Description: LTG:  Patient will will demonstrate this level of attention during functional activites (SLP) Flowsheets (Taken 04/13/2023 1557) Patient will demonstrate during cognitive/linguistic activities the attention type of: Sustained LTG: Patient will demonstrate  this level of attention during cognitive/linguistic activities with assistance of (SLP): Moderate Assistance - Patient 50 - 74%   Problem: RH Awareness Goal: LTG: Patient will demonstrate awareness during functional activites type of (SLP) Description: LTG: Patient will demonstrate awareness during functional activites type of (SLP) Flowsheets (Taken 04/13/2023 1557) Patient will demonstrate during cognitive/linguistic activities awareness type of:  Emergent  Intellectual LTG: Patient will demonstrate awareness during cognitive/linguistic activities with assistance of (SLP): Moderate Assistance - Patient 50 - 74%   Problem: RH Swallowing Goal: LTG Patient will consume least restrictive diet using compensatory strategies with assistance (SLP) Description: LTG:  Patient will consume least restrictive diet using compensatory strategies with assistance (SLP) Flowsheets (Taken 04/13/2023 1600) LTG: Pt Patient will consume least restrictive diet using compensatory strategies with assistance of (SLP): Minimal Assistance - Patient > 75%

## 2023-04-13 NOTE — Progress Notes (Signed)
Inpatient Rehabilitation Center Individual Statement of Services  Patient Name:  [name]  Date:  04/13/2023  Welcome to the Inpatient Rehabilitation Center.  Our goal is to provide you with an individualized program based on your diagnosis and situation, designed to meet your specific needs.  With this comprehensive rehabilitation program, you will be expected to participate in at least 3 hours of rehabilitation therapies Monday-Friday, with modified therapy programming on the weekends.  Your rehabilitation program will include the following services:  Physical Therapy (PT), Occupational Therapy (OT), Speech Therapy (ST), 24 hour per day rehabilitation nursing, Therapeutic Recreaction (TR), Care Coordinator, Rehabilitation Medicine, Nutrition Services, and Pharmacy Services  Weekly team conferences will be held on Wednesday to discuss your progress.  Your Inpatient Rehabilitation Care Coordinator will talk with you frequently to get your input and to update you on team discussions.  Team conferences with you and your family in attendance may also be held.  Expected length of stay: 7-10 days  Overall anticipated outcome: supervision with cues  Depending on your progress and recovery, your program may change. Your Inpatient Rehabilitation Care Coordinator will coordinate services and will keep you informed of any changes. Your Inpatient Rehabilitation Care Coordinator's name and contact numbers are listed  below.  The following services may also be recommended but are not provided by the Inpatient Rehabilitation Center:  Driving Evaluations Home Health Rehabiltiation Services Outpatient Rehabilitation Services    Arrangements will be made to provide these services after discharge if needed.  Arrangements include referral to agencies that provide these services.  Your insurance has been verified to be:  [employer]-medicare Your primary doctor is:  Samuel Jester  Pertinent information will  be shared with your doctor and your insurance company.  Inpatient Rehabilitation Care Coordinator:  Dossie Der, [place] [phone] or Luna Glasgow  Information discussed with and copy given to patient by: Lucy Chris, 04/13/2023, 9:44 AM

## 2023-04-13 NOTE — Progress Notes (Signed)
Patient ID: [name], female   DOB: 22-Sep-1940, 82 y.o.   MRN: [id number] Met with the patient and grand-son to review current situation, rehab schedule, team conference and plan of care. Discussed secondary risk management including THTN, CAD with new onset A-fib on Eliquis. Reviewed medications and dietary modification recommendations on dysphagia diet.  Noted hx of phlebitis; dry flaky skin and discoloration to bil shins; MD made aware of need for cream. Access code to MyChart given to patient and instructions on accessing account.  Continue to follow along to address educational needs to facilitate preparation for discharge. Pamelia Hoit

## 2023-04-13 NOTE — Evaluation (Addendum)
Speech Language Pathology Assessment and Plan  Patient Details  Name: [name] MRN: [id number] Date of Birth: 1941-04-25  SLP Diagnosis: Dysphagia;Dysarthria;Cognitive Impairments  Rehab Potential: Fair ELOS: 7-10 days    Today's Date: 04/13/2023 SLP Individual Time: 1300-1400 SLP Individual Time Calculation (min): 60 min   Hospital Problem: Principal Problem:   Left middle cerebral artery stroke [facility])  Past Medical History:  Past Medical History:  Diagnosis Date   Chest pain    Coronary artery disease    Hypertension    LBBB (left bundle branch block)    Past Surgical History:  Past Surgical History:  Procedure Laterality Date   ABDOMINAL HYSTERECTOMY     CARDIAC CATHETERIZATION N/A 03/23/2016   Procedure: Left Heart Cath and Coronary Angiography;  Surgeon: Lennette Bihari, MD;  Location: [facility] INVASIVE CV LAB;  Service: Cardiovascular;  Laterality: N/A;   CHOLECYSTECTOMY     IR CT HEAD LTD  04/04/2023   IR PERCUTANEOUS ART THROMBECTOMY/INFUSION INTRACRANIAL INC DIAG ANGIO  04/04/2023   RADIOLOGY WITH ANESTHESIA N/A 04/04/2023   Procedure: IR WITH ANESTHESIA;  Surgeon: Radiologist, Medication, MD;  Location: [facility] OR;  Service: Radiology;  Laterality: N/A;   VARICOSE VEIN SURGERY      Assessment / Plan / Recommendation Clinical Impression  [name] is an 82 year old right-handed female with history of CAD/left bundle branch block maintained on low-dose aspirin, hypertension, hyperlipidemia.  Per chart review patient lives with son independent without assistive device performing all ADLs and driving at baseline prior to admission.  Presented [facility]/04/20[facility] to [facility] with acute onset of right side weakness facial droop and aphasia. CTA showed emergent large vessel occlusion of the left M1 segment.   2 mm left superior cerebellar aneurysm.  Not a candidate for TNK.  Patient was transferred to [facility] and underwent revascularization/mechanical  thrombectomy per interventional radiology.  CT/MRI follow-up showed acute left MCA territory infarct involving the insula, left temporal lobe and corona radiata.  No mass effect.  Additional punctate embolic acute infarcts in the right parietal lobe.  Echocardiogram ejection fraction of 45 to 50%.  The left ventricle demonstrating global hypokinesis.  Hospital course new onset atrial fibrillation initially on aspirin transition to Eliquis.  Currently on a dysphagia #1 nectar thick liquid diet.  Therapy evaluations completed due to patient decreased functional ability right-sided weakness with aphasia/dysphagia was admitted for a comprehensive rehab program. Patient was admitted to CIR on 04/12/23.   Cognition: Portions of COGNISTAT and SLUMS administered to determine cognitive linguistic function. Pt presents with deficits in attention, calculations, recall, orientation, and problem solving. Informally, pt unable to recall DOB, age, and address. She did not recall call button or examples of utilization. After teaching pt continued to require mod A for locating correct button. PTA pt reports independence with driving, medication, financial management, and cooking.   Swallowing: Oral mechanism exam revealed R side facial droop, white coating on dorsum of tongue, decreased lingual strength, edentulous upper gum line and sparse lower dentition. Pt able to elicit volitional swallow and volitional cough although weak.She consumed thin liquid via cup which was unremarkable. Pt presented with immediate cough after the swallow of thin liquids via straw. She consumed Dys 1 solids with min to mod lingual residue remaining after swallow. Pt able to clear residue with additional time. Although pt tolerated thin liquid via cup edge without s/sx asp. Pt with poor recall and awareness of limitation which may impact swallowing safety. SLP recommending continue Dys 1  and nectar thick liquids diet at this time.  Dysarthria: Pt  also presents with moderate flaccid dysarthria with contributing oral motor deficits as mentioned above. Dysarthria c/b decreased vocal intensity and flat affect. Further deficits include imprecise articulation and overall speech intelligibility ~70% with words and ~50% with phrases and sentences. Language was assessed informally with strengths in auditory comprehension, repetition, and naming (20/20 on WAB).    Pt would benefit from skilled SLP services to maximize dysphagia, cognition and dysarthria in order to maximize her independence prior to discharge. Anticipate pt will require supervision at home and f/u home health SLP services.    Skilled Therapeutic Interventions          BSE, informal assessment measures, and COGNISTAT administered. Please see full report for additional details.     SLP Assessment  Patient will need skilled Speech Lanaguage Pathology Services during CIR admission    Recommendations  SLP Diet Recommendations: Dysphagia 1 (Puree);Nectar Liquid Administration via: Cup;Straw Medication Administration: Whole meds with puree Supervision: Staff to assist with self feeding;Full supervision/cueing for compensatory strategies Compensations: Lingual sweep for clearance of pocketing;Small sips/bites;Slow rate Postural Changes and/or Swallow Maneuvers: Seated upright 90 degrees Oral Care Recommendations: Oral care before and after PO Patient destination: Home Follow up Recommendations: 24 hour supervision/assistance;Outpatient SLP;Home Health SLP Equipment Recommended: None recommended by SLP    SLP Frequency 3 to 5 out of 7 days   SLP Duration  SLP Intensity  SLP Treatment/Interventions 7-10 days  Minumum of 1-2 x/day, 30 to 90 minutes  Cognitive remediation/compensation;Dysphagia/aspiration precaution training;Internal/external aids;Speech/Language facilitation;Cueing hierarchy;Therapeutic Activities;Patient/family education;Multimodal communication approach;Functional  tasks    Pain Pain Assessment Pain Scale: 0-10 Pain Score: 0-No pain  Prior Functioning Cognitive/Linguistic Baseline: Information not available Type of Home: House  Lives With: Son Available Help at Discharge: Family;Available 24 hours/day Vocation: Retired  Architectural technologist Overall Cognitive Status: Impaired/Different from baseline Arousal/Alertness: Awake/alert Orientation Level: Oriented to person;Oriented to place Year:  ("I have no idea") Month: January Day of Week: Incorrect Attention: Focused;Sustained Focused Attention: Appears intact Sustained Attention: Appears intact Sustained Attention Impairment: Verbal basic Memory: Impaired Memory Impairment: Decreased recall of new information;Storage deficit;Retrieval deficit;Decreased short term memory Decreased Short Term Memory: Verbal basic;Functional basic Awareness: Impaired Awareness Impairment: Intellectual impairment Problem Solving: Impaired Problem Solving Impairment: Verbal basic;Functional basic Executive Function: Reasoning;Sequencing;Self Monitoring;Self Correcting Reasoning: Impaired Reasoning Impairment: Verbal basic;Functional basic Sequencing: Impaired Sequencing Impairment: Verbal basic;Functional basic Self Monitoring: Impaired Self Monitoring Impairment: Verbal basic;Functional basic Self Correcting: Impaired Self Correcting Impairment: Verbal basic;Functional basic Behaviors: Perseveration Safety/Judgment: Impaired Comments: Decreased insight into limitiations and deficits  Comprehension Auditory Comprehension Overall Auditory Comprehension: Appears within functional limits for tasks assessed Expression Expression Primary Mode of Expression: Verbal Verbal Expression Overall Verbal Expression: Appears within functional limits for tasks assessed Oral Motor Oral Motor/Sensory Function Overall Oral Motor/Sensory Function: Mild impairment Facial ROM: Reduced right;Suspected CN VII  (facial) dysfunction Facial Symmetry: Abnormal symmetry right;Suspected CN VII (facial) dysfunction Facial Strength: Reduced right;Suspected CN VII (facial) dysfunction Facial Sensation: Within Functional Limits Lingual ROM: Reduced right;Suspected CN XII (hypoglossal) dysfunction Lingual Symmetry: Within Functional Limits Lingual Strength: Reduced Motor Speech Overall Motor Speech: Impaired Respiration: Within functional limits Phonation: Low vocal intensity Articulation: Impaired Level of Impairment: Word Intelligibility: Intelligibility reduced Word: 50-74% accurate Phrase: 25-49% accurate Sentence: 25-49% accurate Motor Planning: Witnin functional limits Motor Speech Errors: Consistent;Unaware Interfering Components: Inadequate dentition  Care Tool Care Tool Cognition Ability to hear (with hearing aid or hearing appliances if normally used Ability to  hear (with hearing aid or hearing appliances if normally used): 1. Minimal difficulty - difficulty in some environments (e.g. when person speaks softly or setting is noisy)   Expression of Ideas and Wants Expression of Ideas and Wants: 3. Some difficulty - exhibits some difficulty with expressing needs and ideas (e.g, some words or finishing thoughts) or speech is not clear   Understanding Verbal and Non-Verbal Content Understanding Verbal and Non-Verbal Content: 3. Usually understands - understands most conversations, but misses some part/intent of message. Requires cues at times to understand  Memory/Recall Ability Memory/Recall Ability : That he or she is in a hospital/hospital unit    Bedside Swallowing Assessment General Diet Prior to this Study: Dysphagia 1 (pureed);Mildly thick liquids (Level 2, nectar thick) Temperature Spikes Noted: No Respiratory Status: Room air History of Recent Intubation: Yes Total duration of intubation (days): 1 days Date extubated: 04/04/23 Behavior/Cognition: Alert;Cooperative;Pleasant  mood;Confused Oral Cavity - Dentition: Poor condition;Edentulous;Missing dentition Self-Feeding Abilities: Able to feed self;Needs set up Vision: Functional for self-feeding Patient Positioning: Upright in bed Baseline Vocal Quality: Normal Volitional Cough: Weak Volitional Swallow: Able to elicit  Oral Care Assessment   Ice Chips Ice chips: Not tested Thin Liquid Thin Liquid: Impaired Presentation: Straw;Cup;Self Fed Pharyngeal  Phase Impairments: Cough - Immediate Nectar Thick Nectar Thick Liquid: Within functional limits Presentation: Cup;Self Fed Honey Thick Honey Thick Liquid: Not tested Puree Puree: Impaired Presentation: Spoon;Self Fed Oral Phase Impairments: Reduced labial seal Oral Phase Functional Implications: Right lateral sulci pocketing;Right anterior spillage Solid Solid: Not tested BSE Assessment Risk for Aspiration Impact on safety and function: Mild aspiration risk;Risk for inadequate nutrition/hydration Other Related Risk Factors: Cognitive impairment  Short Term Goals: Week 1: SLP Short Term Goal 1 (Week 1): STGs= LTGs due to ELOS  Refer to Care Plan for Long Term Goals  Recommendations for other services: None   Discharge Criteria: Patient will be discharged from SLP if patient refuses treatment 3 consecutive times without medical reason, if treatment goals not met, if there is a change in medical status, if patient makes no progress towards goals or if patient is discharged from hospital.  The above assessment, treatment plan, treatment alternatives and goals were discussed and mutually agreed upon: No family available/patient unable  Renaee Munda 04/13/2023, 1:56 PM

## 2023-04-13 NOTE — Discharge Instructions (Addendum)
Inpatient Rehab Discharge Instructions  [name] [facility] Discharge date and time: No discharge date for patient encounter.   Activities/Precautions/ Functional Status: Activity: activity as tolerated Diet: Dysphagia #1 thin liquids Wound Care: Routine skin checks Functional status:  ___ No restrictions     ___ Walk up steps independently ___ 24/7 supervision/assistance   ___ Walk up steps with assistance ___ Intermittent supervision/assistance  ___ Bathe/dress independently ___ Walk with walker     _x__ Bathe/dress with assistance ___ Walk Independently    ___ Shower independently ___ Walk with assistance    ___ Shower with assistance ___ No alcohol     ___ Return to work/school ________  Special Instructions:    COMMUNITY REFERRALS UPON DISCHARGE:    Home Health:   PT   OT    SP                   Agency:[facility]   Phone: [phone]    Medical Equipment/Items Ordered:HAS ALL NEEDED EQUIPMENT FROM PAST ADMISSIONS                                                 Agency/Supplier:NA    No driving smoking or alcoholSTROKE/TIA DISCHARGE INSTRUCTIONS SMOKING Cigarette smoking nearly doubles your risk of having a stroke & is the single most alterable risk factor  If you smoke or have smoked in the last 12 months, you are advised to quit smoking for your health. Most of the excess cardiovascular risk related to smoking disappears within a year of stopping. Ask you doctor about anti-smoking medications [place] Quit Line: 1-800-QUIT NOW Free Smoking Cessation Classes [phone]  CHOLESTEROL Know your levels; limit fat & cholesterol in your diet  Lipid Panel     Component Value Date/Time   CHOL 143 04/05/2023 0755   TRIG 49 04/05/2023 0755   HDL 46 04/05/2023 0755   CHOLHDL 3.1 04/05/2023 0755   VLDL 10 04/05/2023 0755   LDLCALC 87 04/05/2023 0755     Many patients benefit from treatment even if their cholesterol is at goal. Goal: Total Cholesterol (CHOL) less than  160 Goal:  Triglycerides (TRIG) less than 150 Goal:  HDL greater than 40 Goal:  LDL (LDLCALC) less than 100   BLOOD PRESSURE American Stroke Association blood pressure target is less that 120/80 mm/Hg  Your discharge blood pressure is:  BP: (!) 148/62 Monitor your blood pressure Limit your salt and alcohol intake Many individuals will require more than one medication for high blood pressure  DIABETES (A1c is a blood sugar average for last 3 months) Goal HGBA1c is under 7% (HBGA1c is blood sugar average for last 3 months)  Diabetes: No known diagnosis of diabetes    Lab Results  Component Value Date   HGBA1C 5.6 04/04/2023    Your HGBA1c can be lowered with medications, healthy diet, and exercise. Check your blood sugar as directed by your physician Call your physician if you experience unexplained or low blood sugars.  PHYSICAL ACTIVITY/REHABILITATION Goal is 30 minutes at least 4 days per week  Activity: Increase activity slowly, Therapies: Physical Therapy: Home Health Return to work:  Activity decreases your risk of heart attack and stroke and makes your heart stronger.  It helps control your weight and blood pressure; helps you relax and can improve your mood. Participate in a regular exercise  program. Talk with your doctor about the best form of exercise for you (dancing, walking, swimming, cycling).  DIET/WEIGHT Goal is to maintain a healthy weight  Your discharge diet is:  Diet Order             DIET - DYS 1 Fluid consistency: Nectar Thick  Diet effective now                   liquids Your height is:  Height: 5\' 4"  (162.6 cm) Your current weight is: Weight: 57.4 kg Your Body Mass Index (BMI) is:  BMI (Calculated): 21.71 Following the type of diet specifically designed for you will help prevent another stroke. Your goal weight range is:   Your goal Body Mass Index (BMI) is 19-24. Healthy food habits can help reduce 3 risk factors for stroke:  High cholesterol,  hypertension, and excess weight.  RESOURCES Stroke/Support Group:  Call [phone]   STROKE EDUCATION PROVIDED/REVIEWED AND GIVEN TO PATIENT Stroke warning signs and symptoms How to activate emergency medical system (call 911). Medications prescribed at discharge. Need for follow-up after discharge. Personal risk factors for stroke. Pneumonia vaccine given: No Flu vaccine given: No My questions have been answered, the writing is legible, and I understand these instructions.  I will adhere to these goals & educational materials that have been provided to me after my discharge from the hospital.     My questions have been answered and I understand these instructions. I will adhere to these goals and the provided educational materials after my discharge from the hospital.  Patient/Caregiver Signature _______________________________ Date __________  Clinician Signature _______________________________________ Date __________  Please bring this form and your medication list with you to all your follow-up doctor's appointments.

## 2023-04-13 NOTE — Plan of Care (Signed)
  Problem: Consults Goal: RH STROKE PATIENT EDUCATION Description: See Patient Education module for education specifics  Outcome: Progressing   Problem: RH SAFETY Goal: RH STG ADHERE TO SAFETY PRECAUTIONS W/ASSISTANCE/DEVICE Description: STG Adhere to Safety Precautions With cues Assistance/Device. Outcome: Progressing   Problem: RH KNOWLEDGE DEFICIT Goal: RH STG INCREASE KNOWLEDGE OF HYPERTENSION Description: Patient and son will be able to manage HTN with medications and dietary modification using educational resources independently Outcome: Progressing Goal: RH STG INCREASE KNOWLEDGE OF DYSPHAGIA/FLUID INTAKE Description: Patient and son will be able to manage dysphagia, medications and dietary modification using educational resources independently Outcome: Progressing Goal: RH STG INCREASE KNOWLEGDE OF HYPERLIPIDEMIA Description: Patient and son will be able to manage HLD with medications and dietary modification using educational resources independently Outcome: Progressing Goal: RH STG INCREASE KNOWLEDGE OF STROKE PROPHYLAXIS Description: Patient and son will be able to manage secondary risks with medications and dietary modification using educational resources independently Outcome: Progressing

## 2023-04-13 NOTE — Progress Notes (Signed)
Inpatient Rehabilitation Care Coordinator Assessment and Plan Patient Details  Name: [name] MRN: [id number] Date of Birth: July 05, 1940  Today's Date: 04/13/2023  Hospital Problems: Principal Problem:   Left middle cerebral artery stroke [facility])  Past Medical History:  Past Medical History:  Diagnosis Date   Chest pain    Coronary artery disease    Hypertension    LBBB (left bundle branch block)    Past Surgical History:  Past Surgical History:  Procedure Laterality Date   ABDOMINAL HYSTERECTOMY     CARDIAC CATHETERIZATION N/A 03/23/2016   Procedure: Left Heart Cath and Coronary Angiography;  Surgeon: Lennette Bihari, MD;  [facility]: [facility] INVAS[facility] LAB;  Service: Cardiovascular;  Laterality: N/A;   CHOLECYSTECTOMY     IR CT HEAD LTD  04/04/2023   IR PERCUTANEOUS ART THROMBECTOMY/INFUSION INTRACRANIAL INC DIAG ANGIO  04/04/2023   RADIOLOGY WITH ANESTHESIA N/A 04/04/2023   Procedure: IR WITH ANESTHESIA;  Surgeon: Radiologist, Medication, MD;  Location: [facility] OR;  Service: Radiology;  Laterality: N/A;   VARICOSE VEIN SURGERY     Social History:  reports that she has never smoked. She has never used smokeless tobacco. She reports that she does not drink alcohol and does not use drugs.  Family / Support Systems Marital Status: Widow/Widower Patient Roles: Parent, Caregiver, Other (Comment) (Retiree) Children: Janet Berlin [phone] Other Supports: brandon-gradnson [phone]  Amanda-ex-daughter in-law [phone] Anticipated Caregiver: Emi Holes in the evenings/nights and Marchelle Folks during the day? Ability/Limitations of Caregiver: Both son and grandson work and son to work on Building surveyor to be with daytime Caregiver Availability: 24/7 Family Dynamics: Close with son and grandson both of them live with her. Pt has many neighbors and church members, she was active prior to this stroke  Social History Preferred language: English Religion: Non-Denominational Cultural  Background: No issues Education: HS Health Literacy - How often do you need to have someone help you when you read instructions, pamphlets, or other written material from your doctor or pharmacy?: Never Writes: Yes Employment Status: Retired Marine scientist Issues: no issues Guardian/Conservator: None-according to MD pt is not fully capable of making her own deicsions while here. Will look toward her son since he is her only child if any decisions need to be made while here   Abuse/Neglect Abuse/Neglect Assessment Can Be Completed: Yes Physical Abuse: Denies Verbal Abuse: Denies Sexual Abuse: Denies Exploitation of patient/patient's resources: Denies Self-Neglect: Denies  Patient response to: Social Isolation - How often do you feel lonely or isolated from those around you?: Never  Emotional Status Pt's affect, behavior and adjustment status: Pt was very active prior to this, she did the home management, drove and assisted with nieces care. She has always been independent according to her son. This is difficult for her due to not able to do for herself currently Recent Psychosocial Issues: was healthy prior to admission Psychiatric History: No issues will continue to evaluate and see if would benefit from neuro-psych while here. Substance Abuse History: no issues  Patient / Family Perceptions, Expectations & Goals Pt/Family understanding of illness & functional limitations: Pt and son can explain her stroke and deficits. Both have spoken with the MD's involved and hope she will do well on rehab. Son will plan to be here daily but it will be after work so will miss therapy staff and MD's so wants to be kept updated on Mom's condition Premorbid pt/family roles/activities: mom, grandmother, retiree, caregiver, church member, neighbor Anticipated changes in roles/activities/participation: resume Pt/family  expectations/goals: Pt states: " I will do my best."  Son states: " I will  make sure she has what she needs at home."  [employer]: None Premorbid Home Care/DME Agencies: None Transportation available at discharge: family Is the patient able to respond to transportation needs?: Yes In the past 12 months, has lack of transportation kept you from medical appointments or from getting medications?: No In the past 12 months, has lack of transportation kept you from meetings, work, or from getting things needed for daily living?: No  Discharge Planning Living Arrangements: Children, Other relatives Support Systems: Children, Other relatives, Church/faith community Type of Residence: Private residence Insurance Resources: Media planner (specify) Investment banker, operational) Financial Resources: Tree surgeon, Family Support Financial Screen Referred: No Living Expenses: Lives with family Money Management: Patient, Family Does the patient have any problems obtaining your medications?: No Home Management: self Patient/Family Preliminary Plans: Return home with her son and Tivis Ringer work but son has a plan in place for 24/7 with his ex-wife to provide care during the day. Aware being evaluated today and goals being set for stay here. Will update tomorrow after team conference Care Coordinator Barriers to Discharge: Insurance for SNF coverage, Lack of/limited family support Care Coordinator Anticipated Follow Up Needs: HH/OP  Clinical Impression Pleasant patient who is motivated to do well here. Her son and grandson live with her but work daytime, plan is to have ex-daughter in-law with her while they are working. Will work on discharge needs and update after team conference tomorrow. Will continue to evaluate for neuro-psych services while here  Lucy Chris 04/13/2023, 9:42 AM

## 2023-04-13 NOTE — Progress Notes (Deleted)
Cardiology Clinic Note   Patient Name: [name] Date of Encounter: 04/13/2023  Primary Care Provider:  Hart Robinsons, DO Primary Cardiologist:  None  Patient Profile    [name] 82 year old female presents the clinic today for follow-up evaluation of her precordial pain and atrial fibrillation.  Past Medical History    Past Medical History:  Diagnosis Date   Chest pain    Coronary artery disease    Hypertension    LBBB (left bundle branch block)    Past Surgical History:  Procedure Laterality Date   ABDOMINAL HYSTERECTOMY     CARDIAC CATHETERIZATION N/A 03/23/2016   Procedure: Left Heart Cath and Coronary Angiography;  Surgeon: Lennette Bihari, MD;  Location: [facility];  Service: Cardiovascular;  Laterality: N/A;   CHOLECYSTECTOMY     IR CT HEAD LTD  04/04/2023   IR PERCUTANEOUS ART THROMBECTOMY/INFUSION INTRACRANIAL INC DIAG ANGIO  04/04/2023   RADIOLOGY WITH ANESTHESIA N/A 04/04/2023   Procedure: IR WITH ANESTHESIA;  Surgeon: Radiologist, Medication, MD;  Location: [facility] OR;  Service: Radiology;  Laterality: N/A;   VARICOSE VEIN SURGERY      Allergies  Allergies  Allergen Reactions   Codeine Nausea And Vomiting   Penicillins Nausea And Vomiting    History of Present Illness    [name] has a PMH of coronary artery disease, LBBB, hypertension, new onset A-fib and acute CVA.  She was admitted to the hospital on 04/04/2023 and discharged on 04/12/2023.  Cardiology was consulted for evaluation of her chest pain.  On exam she indicated that she had fallen prior to admission and injured her ribs.  She was tender to the palpation.  She was felt to have muscle wall pain that was not cardiac.  Her telemetry showed atrial fibrillation 50-70 bpm and PVCs.  Her echocardiogram 04/05/2023 showed an EF of 45-50%, global hypokinesis, mildly dilated right and left atria and trivial mitral valve regurgitation.  Acetaminophen was recommended for pain  management.  She previously did not tolerate metoprolol.  Her atrial fibrillation was rate controlled.  Rate control strategy was recommended.  Transition to DOAC per neurology's recommendations was also recommended.  Plan for outpatient reevaluation and potential DCCV was planned.  She was not noted to have heart failure symptoms.  She was euvolemic.  She was continued on losartan.  Aldactone 25 mg daily was also recommended if blood pressure allowed.  Beta-blocker was not recommended due to slow ventricular response.  She presents to the clinic today for follow-up evaluation and states***.  *** denies chest pain, shortness of breath, lower extremity edema, fatigue, palpitations, melena, hematuria, hemoptysis, diaphoresis, weakness, presyncope, syncope, orthopnea, and PND.  Atrial fibrillation-EKG today shows***.  Noted on telemetry and was new onset.  Did not tolerate metoprolol. Continue DOAC Discussed DCCV  Acute systolic CHF-euvolemic.  Weight stable.  EF noted to be 45-50% Continue losartan Unable to uptitrate GDMT at this time due to blood pressure and recent CVA. Heart healthy low-sodium diet  LBBB-*** Continue to monitor Maintain physical activity as tolerated  Chest pain-noted to have musculoskeletal chest pain during recent admission.  Reports pain is less over the last few days. Continue conservative management May use warm and cool compresses Continue Tylenol  Disposition: Follow-up with Dr. Flora Lipps or me in 1 month.   Home Medications    Prior to Admission medications   Medication Sig Start Date End Date Taking? Authorizing Provider  acetaminophen (TYLENOL) 500 MG tablet Take  500 mg by mouth every 6 (six) hours as needed for mild pain.    [provider]  apixaban (ELIQUIS) 5 MG TABS tablet Take 1 tablet (5 mg total) by mouth 2 (two) times daily. 04/12/23   Lynnae January, NP  atorvastatin (LIPITOR) 80 MG tablet Take 1 tablet (80 mg total) by mouth daily.  04/13/23   Lynnae January, NP  Cholecalciferol (D-3-5) 5000 units capsule Take 5,000 Units by mouth daily.    [provider]  cloNIDine (CATAPRES) 0.1 MG tablet Take 1 tablet (0.1 mg total) by mouth 2 (two) times daily. 03/11/17   Lewayne Bunting, MD  losartan (COZAAR) 100 MG tablet Take 100 mg by mouth daily.    [provider]    Family History    Family History  Problem Relation Age of Onset   Cancer Mother    Heart attack Father    She indicated that her mother is deceased. She indicated that her father is deceased. She indicated that her maternal grandmother is deceased. She indicated that her maternal grandfather is deceased. She indicated that her paternal grandmother is deceased. She indicated that her paternal grandfather is deceased.  Social History    Social History   Socioeconomic History   Marital status: Widowed    Spouse name: Not on file   Number of children: 2   Years of education: Not on file   Highest education level: Not on file  Occupational History   Not on file  Tobacco Use   Smoking status: Never   Smokeless tobacco: Never  Vaping Use   Vaping status: Never Used  Substance and Sexual Activity   Alcohol use: No   Drug use: No   Sexual activity: Not on file  Other Topics Concern   Not on file  Social History Narrative   Not on file   Social Determinants of Health   Financial Resource Strain: Low Risk  (03/24/2020)   Received from [facility]   Overall Financial Resource Strain (CARDIA)    Difficulty of Paying Living Expenses: Not hard at all  Food Insecurity: No Food Insecurity (04/06/2023)   Hunger Vital Sign    Worried About Running Out of Food in the Last Year: Never true    Ran Out of Food in the Last Year: Never true  Transportation Needs: No Transportation Needs (04/06/2023)   PRAPARE - Administrator, Civil Service (Medical): No    Lack of Transportation (Non-Medical): No  Physical Activity: Not on  file  Stress: Not on file  Social Connections: Unknown (03/24/2020)   Received from [facility]   Social Connection and Isolation Panel [NHANES]    Frequency of Communication with Friends and Family: More than three times a week    Frequency of Social Gatherings with Friends and Family: More than three times a week    Attends Religious Services: Not on file    Active Member of Clubs or Organizations: Not on file    Attends Banker Meetings: Not on file    Marital Status: Not on file  Intimate Partner Violence: Not At Risk (04/06/2023)   Humiliation, Afraid, Rape, and Kick questionnaire    Fear of Current or Ex-Partner: No    Emotionally Abused: No    Physically Abused: No    Sexually Abused: No     Review of Systems    General:  No chills, fever, night sweats or weight changes.  Cardiovascular:  No chest pain, dyspnea on exertion, edema, orthopnea, palpitations, paroxysmal nocturnal dyspnea. Dermatological: No rash, lesions/masses Respiratory: No cough, dyspnea Urologic: No hematuria, dysuria Abdominal:   No nausea, vomiting, diarrhea, bright red blood per rectum, melena, or hematemesis Neurologic:  No visual changes, wkns, changes in mental status. All other systems reviewed and are otherwise negative except as noted above.  Physical Exam    VS:  There were no vitals taken for this visit. , BMI There is no height or weight on file to calculate BMI. GEN: Well nourished, well developed, in no acute distress. HEENT: normal. Neck: Supple, no JVD, carotid bruits, or masses. Cardiac: RRR, no murmurs, rubs, or gallops. No clubbing, cyanosis, edema.  Radials/DP/PT 2+ and equal bilaterally.  Respiratory:  Respirations regular and unlabored, clear to auscultation bilaterally. GI: Soft, nontender, nondistended, BS + x 4. MS: no deformity or atrophy. Skin: warm and dry, no rash. Neuro:  Strength and sensation are intact. Psych: Normal affect.  Accessory Clinical  Findings    Recent Labs: 04/04/2023: B Natriuretic Peptide 228.0; TSH 3.014 04/11/2023: Magnesium 2.0 04/13/2023: ALT 70; BUN 14; Creatinine, Ser 0.86; Hemoglobin 11.9; Platelets 182; Potassium 4.0; Sodium 138   Recent Lipid Panel    Component Value Date/Time   CHOL 143 04/05/2023 0755   TRIG 49 04/05/2023 0755   HDL 46 04/05/2023 0755   CHOLHDL 3.1 04/05/2023 0755   VLDL 10 04/05/2023 0755   LDLCALC 87 04/05/2023 0755   LDLDIRECT 137.0 08/20/2008 0829         ECG personally reviewed by me today- ***     Echocardiogram 04/05/2023  1. Left ventricular ejection fraction, by estimation, is 45 to 50%. The  left ventricle has mildly decreased function. The left ventricle  demonstrates global hypokinesis. Left ventricular diastolic function could  not be evaluated.   2. Right ventricular systolic function is normal. The right ventricular  size is normal.   3. Left atrial size was mildly dilated.   4. Right atrial size was mildly dilated.   5. The mitral valve is normal in structure. Trivial mitral valve  regurgitation. No evidence of mitral stenosis.   6. The aortic valve is tricuspid. There is mild calcification of the  aortic valve. Aortic valve regurgitation is not visualized. No aortic  stenosis is present.   7. The inferior vena cava is normal in size with <50% respiratory  variability, suggesting right atrial pressure of 8 mmHg.      Assessment & Plan   1.  ***   Thomasene Ripple. Stran Raper NP-C     04/13/2023, 1:34 PM [facility] [address] Office [phone] Fax [phone]    I spent***minutes examining this patient, reviewing medications, and using patient centered shared decision making involving her cardiac care.   I spent greater than 20 minutes reviewing her past medical history,  medications, and prior cardiac tests.

## 2023-04-13 NOTE — Plan of Care (Signed)
  Problem: RH Eating Goal: LTG Patient will perform eating w/assist, cues/equip (OT) Description: LTG: Patient will perform eating with assist, with/without cues using equipment (OT) Flowsheets (Taken 04/13/2023 1142) LTG: Pt will perform eating with assistance level of: Supervision/Verbal cueing   Problem: RH Grooming Goal: LTG Patient will perform grooming w/assist,cues/equip (OT) Description: LTG: Patient will perform grooming with assist, with/without cues using equipment (OT) Flowsheets (Taken 04/13/2023 1142) LTG: Pt will perform grooming with assistance level of: Supervision/Verbal cueing   Problem: RH Bathing Goal: LTG Patient will bathe all body parts with assist levels (OT) Description: LTG: Patient will bathe all body parts with assist levels (OT) Flowsheets (Taken 04/13/2023 1142) LTG: Pt will perform bathing with assistance level/cueing: Supervision/Verbal cueing   Problem: RH Dressing Goal: LTG Patient will perform upper body dressing (OT) Description: LTG Patient will perform upper body dressing with assist, with/without cues (OT). Flowsheets (Taken 04/13/2023 1142) LTG: Pt will perform upper body dressing with assistance level of: Supervision/Verbal cueing Goal: LTG Patient will perform lower body dressing w/assist (OT) Description: LTG: Patient will perform lower body dressing with assist, with/without cues in positioning using equipment (OT) Flowsheets (Taken 04/13/2023 1142) LTG: Pt will perform lower body dressing with assistance level of: Supervision/Verbal cueing   Problem: RH Toileting Goal: LTG Patient will perform toileting task (3/3 steps) with assistance level (OT) Description: LTG: Patient will perform toileting task (3/3 steps) with assistance level (OT)  Flowsheets (Taken 04/13/2023 1142) LTG: Pt will perform toileting task (3/3 steps) with assistance level: Supervision/Verbal cueing   Problem: RH Vision Goal: RH LTG Vision Consulting civil engineer) Flowsheets (Taken  04/13/2023 1142) LTG: Vision Goals: Pt will attend to all stimulii presented on R side during ADL's with min cues and S   Problem: RH Functional Use of Upper Extremity Goal: LTG Patient will use RT/LT upper extremity as a (OT) Description: LTG: Patient will use right/left upper extremity as a stabilizer/gross assist/diminished/nondominant/dominant level with assist, with/without cues during functional activity (OT) Flowsheets (Taken 04/13/2023 1142) LTG: Use of upper extremity in functional activities: RUE as diminished level LTG: Pt will use upper extremity in functional activity with assistance level of: Supervision/Verbal cueing   Problem: RH Toilet Transfers Goal: LTG Patient will perform toilet transfers w/assist (OT) Description: LTG: Patient will perform toilet transfers with assist, with/without cues using equipment (OT) Flowsheets (Taken 04/13/2023 1142) LTG: Pt will perform toilet transfers with assistance level of: Supervision/Verbal cueing   Problem: RH Tub/Shower Transfers Goal: LTG Patient will perform tub/shower transfers w/assist (OT) Description: LTG: Patient will perform tub/shower transfers with assist, with/without cues using equipment (OT) Flowsheets (Taken 04/13/2023 1142) LTG: Pt will perform tub/shower stall transfers with assistance level of: Supervision/Verbal cueing

## 2023-04-13 NOTE — Evaluation (Signed)
Physical Therapy Assessment and Plan  Patient Details  Name: [name] MRN: [id number] Date of Birth: 1940/06/25  PT Diagnosis: Abnormal posture, Abnormality of gait, Cognitive deficits, Difficulty walking, Hemiplegia dominant, Hypotonia, Impaired cognition, and Muscle weakness Rehab Potential: Good ELOS: 7-9 days   Today's Date: 04/13/2023 PT Individual Time: 0900-1015 PT Individual Time Calculation (min): 75 min    Hospital Problem: Principal Problem:   Left middle cerebral artery stroke [facility])   Past Medical History:  Past Medical History:  Diagnosis Date   Chest pain    Coronary artery disease    Hypertension    LBBB (left bundle branch block)    Past Surgical History:  Past Surgical History:  Procedure Laterality Date   ABDOMINAL HYSTERECTOMY     CARDIAC CATHETERIZATION N/A 03/23/2016   Procedure: Left Heart Cath and Coronary Angiography;  Surgeon: Lennette Bihari, MD;  Location: [facility] INVASIVE CV LAB;  Service: Cardiovascular;  Laterality: N/A;   CHOLECYSTECTOMY     IR CT HEAD LTD  04/04/2023   IR PERCUTANEOUS ART THROMBECTOMY/INFUSION INTRACRANIAL INC DIAG ANGIO  04/04/2023   RADIOLOGY WITH ANESTHESIA N/A 04/04/2023   Procedure: IR WITH ANESTHESIA;  Surgeon: Radiologist, Medication, MD;  Location: [facility] OR;  Service: Radiology;  Laterality: N/A;   VARICOSE VEIN SURGERY      Assessment & Plan Clinical Impression: Patient is a 82 year old right-handed female with history of CAD/left bundle branch block maintained on low-dose aspirin, hypertension, hyperlipidemia.  Per chart review patient lives with son independent without assistive device performing all ADLs and driving at baseline prior to admission.  Presented 04/04/2023 to [facility] with acute onset of right side weakness facial droop and aphasia.  Admission chemistries unremarkable, troponin negative, BNP 228.  Cranial CT scan showed aging brain without acute findings.  CTA showed emergent large vessel  occlusion of the left M1 segment.   2 mm left superior cerebellar aneurysm.  Not a candidate for TNK.  Patient was transferred to [facility] and underwent revascularization/mechanical thrombectomy per interventional radiology.  CT/MRI follow-up showed acute left MCA territory infarct involving the insula, left temporal lobe and corona radiata.  No mass effect.  Additional punctate embolic acute infarcts in the right parietal lobe.  Echocardiogram ejection fraction of 45 to 50%.  The left ventricle demonstrating global hypokinesis.  Hospital course new onset atrial fibrillation initially on aspirin transition to Eliquis.  Currently on a dysphagia #1 nectar thick liquid diet.  Therapy evaluations completed due to patient decreased functional ability right-sided weakness with aphasia/dysphagia was admitted for a comprehensive rehab program. Patient transferred to [facility] on 04/12/2023 .   Patient currently requires min with mobility secondary to muscle weakness and muscle joint tightness, decreased cardiorespiratoy endurance, unbalanced muscle activation, decreased visual perceptual skills and decreased visual motor skills, decreased initiation, decreased attention, decreased awareness, decreased problem solving, decreased safety awareness, and decreased memory, and decreased sitting balance, decreased standing balance, decreased postural control, hemiplegia, and decreased balance strategies.  Prior to hospitalization, patient was independent  with mobility and lived with Son in a House home.  Home access is 3Stairs to enter.  Patient will benefit from skilled PT intervention to maximize safe functional mobility, minimize fall risk, and decrease caregiver burden for planned discharge home with 24 hour supervision.  Anticipate patient will benefit from follow up HH at discharge.  PT - End of Session Activity Tolerance: Tolerates 10 - 20 min activity with multiple rests Endurance Deficit: Yes PT  Assessment Rehab Potential (ACUTE/IP  ONLY): Good PT Barriers to Discharge: Decreased caregiver support;Home environment access/layout;Lack of/limited family support;Insurance for SNF coverage;Behavior PT Barriers to Discharge Comments: Confusion/cognition PT Patient demonstrates impairments in the following area(s): Balance;Endurance;Motor;Safety;Skin Integrity PT Transfers Functional Problem(s): Bed Mobility;Bed to Chair;Car PT Locomotion Functional Problem(s): Ambulation;Stairs PT Plan PT Intensity: Minimum of 1-2 x/day ,45 to 90 minutes PT Frequency: 5 out of 7 days PT Duration Estimated Length of Stay: 7-9 days PT Treatment/Interventions: Ambulation/gait training;Cognitive remediation/compensation;Discharge planning;DME/adaptive equipment instruction;Functional mobility training;Pain management;Psychosocial support;Splinting/orthotics;Therapeutic Activities;UE/LE Strength taining/ROM;Visual/perceptual remediation/compensation;Balance/vestibular training;Community reintegration;Disease management/prevention;Functional electrical stimulation;Neuromuscular re-education;Patient/family education;Skin care/wound management;Stair training;Therapeutic Exercise;UE/LE Coordination activities;Wheelchair propulsion/positioning PT Transfers Anticipated Outcome(s): supervision PT Locomotion Anticipated Outcome(s): supervision PT Recommendation Recommendations for Other Services: Neuropsych consult Follow Up Recommendations: 24 hour supervision/assistance;Home health PT Patient destination: Home Equipment Recommended: To be determined   PT Evaluation Precautions/Restrictions Precautions Precautions: Fall Precaution Comments: R hemi (UE>LE) Restrictions Weight Bearing Restrictions: No Pain   Denies any pain Pain Interference Pain Interference Pain Effect on Sleep: 0. Does not apply - I have not had any pain or hurting in the past 5 days Pain Interference with Therapy Activities: 0. Does not  apply - I have not received rehabilitationtherapy in the past 5 days Pain Interference with Day-to-Day Activities: 1. 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 [employer] Vision/Perception  Vision - History Ability to See in Adequate Light: 2 Moderately impaired Vision - Assessment Eye Alignment: Within Functional Limits Ocular Range of Motion: Restricted looking up Alignment/Gaze Preference: Within Defined Limits Tracking/Visual Pursuits: Decreased smoothness of horizontal tracking;Decreased smoothness of vertical tracking Saccades: Impaired - to be further tested in functional context Convergence: Impaired (comment) Perception Perception: Impaired Preception Impairment Details: Inattention/Neglect Perception-Other Comments: R inattention Praxis Praxis: Impaired Praxis Impairment Details: Initiation;Motor planning  Cognition Overall Cognitive Status: Impaired/Different from baseline Arousal/Alertness: Awake/alert Orientation Level: Oriented to person;Oriented to place;Disoriented to situation;Disoriented to time Year: Other (Comment) [phone]) Month: January Day of Week: Incorrect ("middle of the week" unable to specify) Attention: Focused;Sustained Focused Attention: Appears intact Sustained Attention: Appears intact Memory: Impaired Memory Impairment: Decreased recall  of new information;Storage deficit;Retrieval deficit;Decreased short term memory Decreased Short Term Memory: Verbal basic;Functional basic Awareness: Impaired Awareness Impairment: Intellectual impairment Problem Solving: Impaired Problem Solving Impairment: Verbal basic;Functional basic Executive Function: Reasoning;Sequencing;Self Monitoring;Self Correcting Reasoning: Impaired Reasoning Impairment: Verbal basic;Functional basic Sequencing: Impaired Sequencing Impairment: Verbal basic;Functional basic Self Monitoring: Impaired Self Monitoring Impairment: Verbal basic;Functional basic Self Correcting: Impaired Self Correcting Impairment: Verbal basic;Functional basic Safety/Judgment: Impaired Comments: Decreased insight into limitiations and deficits Sensation Sensation Light Touch: Appears Intact Hot/Cold: Appears Intact Proprioception: Impaired by gross assessment Stereognosis: Not tested Coordination Gross Motor Movements are Fluid and Coordinated: No Coordination and Movement Description: R hemi Motor  Motor Motor: Hemiplegia;Abnormal postural alignment and control Motor - Skilled Clinical Observations: UE > LE hemi   Trunk/Postural Assessment  Cervical Assessment Cervical Assessment: Within Functional Limits Thoracic Assessment Thoracic Assessment: Exceptions to [facility] (mild kyphosis, rounded shoulders) Lumbar Assessment Lumbar Assessment: Exceptions to [facility] (posteriorly tilted) Postural Control Postural  Control: Deficits on evaluation Trunk Control: R trunk lean, posterior bias  Balance Balance Balance Assessed: Yes Static Sitting Balance Static Sitting - Balance Support: Feet supported;No upper extremity supported Static Sitting - Level of Assistance: 5: Stand by assistance Dynamic Sitting Balance Dynamic Sitting - Balance Support: Feet supported;No upper extremity supported;During functional activity Dynamic Sitting - Level of Assistance: 4: Min Games developer Standing - Balance Support: No upper extremity supported Static Standing - Level of Assistance: 4: Min assist Dynamic Standing Balance Dynamic Standing - Balance Support: No upper extremity supported;During functional activity Dynamic Standing - Level of Assistance: 3: Mod assist Extremity Assessment      RLE Assessment RLE Assessment: Exceptions to [facility] General Strength Comments: Grossly 4/5 LLE Assessment LLE Assessment: Within Functional Limits  Care Tool Care Tool Bed Mobility Roll left and right activity   Roll left and right assist level: Supervision/Verbal cueing    Sit to lying activity   Sit to lying assist level: Minimal Assistance - Patient > 75%    Lying to sitting on side of bed activity   Lying to sitting on side of bed assist level: the ability to move from lying on the back to sitting on the side of the bed with no back support.: Minimal Assistance - Patient > 75%     Care Tool Transfers Sit to stand transfer   Sit to stand assist level: Minimal Assistance - Patient > 75%    Chair/bed transfer   Chair/bed transfer assist level: Minimal Assistance - Patient > 75%    Car transfer   Car transfer assist level: Minimal Assistance - Patient > 75%      Care Tool Locomotion Ambulation   Assist level: Minimal Assistance - Patient > 75% Assistive device: No Device Max distance: 13ft  Walk 10 feet activity   Assist level: Minimal Assistance - Patient > 75% Assistive device: No Device   Walk 50 feet with 2 turns activity   Assist level: Minimal Assistance - Patient > 75% Assistive device: No Device  Walk 150 feet activity   Assist level: Minimal Assistance - Patient > 75% Assistive device: No Device  Walk 10 feet on uneven surfaces activity   Assist level: Minimal Assistance - Patient > 75%    Stairs   Assist level: Minimal Assistance - Patient > 75% Stairs assistive device: 2 hand rails Max number of stairs: 12  Walk up/down 1  step activity   Walk up/down 1 step (curb) assist level: Minimal Assistance - Patient > 75% Walk up/down 1 step or curb assistive device: 2 hand rails  Walk up/down 4 steps activity   Walk up/down 4 steps assist level: Minimal Assistance - Patient > 75% Walk up/down 4 steps assistive device: 2 hand rails  Walk up/down 12 steps activity   Walk up/down 12 steps assist level: Minimal Assistance - Patient > 75% Walk up/down 12 steps assistive device: 2 hand rails  Pick up small objects from floor   Pick up small object from the floor assist level: Minimal Assistance - Patient > 75%    Wheelchair Is the patient using a wheelchair?: No          Wheel 50 feet with 2 turns activity      Wheel 150 feet activity        Refer to Care Plan for Long Term Goals  SHORT TERM GOAL WEEK 1 PT Short Term Goal 1 (Week 1): STG = LTG due to ELOS  Recommendations for other services: Neuropsych  Skilled Therapeutic Intervention Mobility Bed Mobility Bed Mobility: Sit to Supine;Supine to Sit Supine to Sit: Minimal Assistance - Patient > 75% Sit to Supine: Minimal Assistance - Patient > 75% Transfers Transfers: Sit to Stand;Stand to Sit;Stand Pivot Transfers Sit to Stand: Minimal Assistance - Patient > 75% Stand to Sit: Minimal Assistance - Patient > 75% Stand Pivot Transfers: Minimal Assistance - Patient > 75% Stand Pivot Transfer Details: Tactile cues for initiation;Verbal cues for gait pattern;Visual cues/gestures for sequencing;Verbal cues for sequencing;Verbal cues for technique;Verbal cues for precautions/safety;Manual facilitation for weight shifting;Tactile cues for posture Transfer (Assistive device): 1 person hand held assist Locomotion  Gait Ambulation: Yes Gait Assistance: Minimal Assistance - Patient > 75% Gait Distance (Feet): 150 Feet Assistive device: None Gait Assistance Details: Manual facilitation for weight shifting;Verbal cues for precautions/safety;Verbal cues for gait  pattern;Verbal cues for technique;Tactile cues for initiation;Tactile cues for posture Gait Gait: Yes Gait Pattern: Impaired Gait Pattern: Step-through pattern;Decreased hip/knee flexion - right;Decreased weight shift to right;Decreased dorsiflexion - right;Poor foot clearance - right;Narrow base of support;Lateral trunk lean to right;Trunk flexed Stairs / Additional Locomotion Stairs: Yes Stairs Assistance: Minimal Assistance - Patient > 75% Stair Management Technique: One rail Right;Step to pattern;Forwards Number of Stairs: 12 Height of Stairs: 6 Ramp: Minimal Assistance - Patient >75% Wheelchair Mobility Wheelchair Mobility: No   Skilled Intervention: Pt lying in bed to start. Agreeable to PT evaluation. Pt pleasant but confused, oriented to self and location only. Per MD, patient is + for UTI so unsure if confusion is related to UTI vs CVA related. Pt completed functional mobility as outlined above. Grossly requires minA for functional mobility. She presents with R sided weakness (UE > LE), confusion and cognitive deficits, impaired dynamic standing balance, and decreased activity tolerance. She completed x5 minutes of Nustep at L4 resistance, cues for attending to task due to busy gym environment. Cues also needed for engagement, full ROM bilaterally, and keeping sufficient cadence. Returned to her room at end of treatment. Provided her with cushion and 1/2 lap tray for her RUE. Son, Fayrene Fearing, present at the bedside. Reviewed patient's current mobility status, primary deficits observed, and team conf days. Patient left with seat belt alarm on, all needs met.  Instructed pt in results of PT evaluation as detailed above, PT POC, rehab potential, rehab goals, and discharge recommendations. Additionally discussed CIR's policies regarding fall safety and use of chair alarm and/or quick release belt. Pt verbalized understanding and in agreement. Will update pt's family members as they become  available.    Discharge Criteria: Patient will be discharged from PT if patient refuses treatment 3 consecutive times without medical reason, if treatment goals not met, if there is a change in medical status, if patient makes no progress towards goals or if patient is discharged from hospital.  The above assessment, treatment plan, treatment alternatives and goals were discussed and mutually agreed upon: by patient  Orrin Brigham  PT, DPT, CSRS  04/13/2023, 9:40 AM

## 2023-04-13 NOTE — Progress Notes (Signed)
Inpatient Rehabilitation  Patient information reviewed and entered into eRehab system by Demarrio Menges Gentry, OTR/L, Rehab Quality Coordinator.   Information including medical coding, functional ability and quality indicators will be reviewed and updated through discharge.   

## 2023-04-14 DIAGNOSIS — I63512 Cerebral infarction due to unspecified occlusion or stenosis of left middle cerebral artery: Secondary | ICD-10-CM | POA: Diagnosis not present

## 2023-04-14 LAB — URINE CULTURE: Culture: 10000 — AB

## 2023-04-14 MED ORDER — L-METHYLFOLATE-B6-B12 3-35-2 MG PO TABS
1.0000 | ORAL_TABLET | Freq: Every day | ORAL | Status: DC
  Administered 2023-04-14 – 2023-04-21 (×8): 1 via ORAL
  Filled 2023-04-14 (×8): qty 1

## 2023-04-14 MED ORDER — LOSARTAN POTASSIUM 50 MG PO TABS
50.0000 mg | ORAL_TABLET | Freq: Every day | ORAL | Status: DC
  Administered 2023-04-15: 50 mg via ORAL
  Filled 2023-04-14: qty 1

## 2023-04-14 MED ORDER — VITAMIN D (ERGOCALCIFEROL) 1.25 MG (50000 UNIT) PO CAPS
50000.0000 [IU] | ORAL_CAPSULE | ORAL | Status: DC
  Administered 2023-04-14 – 2023-04-21 (×2): 50000 [IU] via ORAL
  Filled 2023-04-14 (×2): qty 1

## 2023-04-14 NOTE — Progress Notes (Signed)
Physical Therapy Session Note  Patient Details  Name: [name] MRN: [id number] Date of Birth: 1940-08-07  Today's Date: 04/14/2023 PT Individual Time: 0915-0956 + 1345-1430 PT Individual Time Calculation (min): 41 min  + 45 min  Short Term Goals: Week 1:  PT Short Term Goal 1 (Week 1): STG = LTG due to ELOS  Skilled Therapeutic Interventions/Progress Updates:      1st session: Pt lying in bed to start and in agreement to therapy session. Supine<>sitting EOB with supervision - her R arm getting awkwardly caught behind her and needing cues for attend to it for safety. Sit<>stand with CGA and no AD from EOB - ambulates with CGA/minA and no AD 136ft to main rehab gym - slight R trunk lean, narrow BOS, downward gaze tendency.   Standing there-ex in // bars: -1x10 deep squats -1x10 hip abd/add -1x15 heel raises -1x10 alternating high knee marching *unilateral UE support in // bars, CGA for standing balance. Decreased AROM on R > L, R inattention present.   Pt instructed in item retrieval task with bright orange cones setup on both sides of hallway. CGA/minA for ambulation as she retrieved these - continues to show R inattention by missing 100% of the cones on her R side and unaware until her attention was brought to them.   Completed BITS system to continue working on R visual scanning and attention. Completed in standing to challenge endurance and balance - minA needed due to occasional R LOB while reaching outside BOS. Pt needing max cues to start, fading to min cues with improved visual scanning. AAROM for R arm for reaching at or below shoulder level - show's some proximal return in her shoulder.   Pt reporting need to void. Ambulated from ortho rehab gym back to her room, directional cues needed due to memory and cognitive deficits. CGA with intermittent hand rail use in the hallway, ~296ft.   Pt toileted and continent of bladder void - assist needed for separating toilet paper  from roll and for pulling pants over hips in standing with CGA for balance. Ended session seated in recliner with seat belt alarm on and call bell in lap, pilllow supporting RUE. Dialed her grandson's phone # per her request. All needs met at end and provided with Nectar thick apple juice.     2nd session: Pt in bed to start - reports having a sore back and wanting to get up to move to help relieve it. Bed mobility completed with supervision. Donned hospital socks with assist for time management.   Sit<>Stand with CGA from EOB with no AD. Ambulated at [facility] level with no AD throughout session, hallway distances >248ft. MinA needed due to persistent R trunk lean, narrow BOS, and R inattention. She appears to have a R visual field deficit.   Assisted onto [facility] and she completed x8 minutes at L60 cm/sec resistance, seat belt used for safety - encouraged adequate cadence and full ROM bilaterally.   Pt participated in therapeutic activity with shooting basketball with her LUE and minA for dynamic standing balance. Pt able to accurately locate and shoot the ball ~3-5 ft away - encouraged R visual scanning to promote R attention.   Stair training completed using 6" steps, 1 hand rail on her L. She navigated x8 steps total with minA for both directions. Cues for sequencing but due to memory and cognitive deficits, poor recall and carryover.   Pt returned to her room and left sitting upright in recliner with  seat belt alarm on. Used the house phone to call her Son, per her request. Patient left with all needs met at end of treatment.       Therapy Documentation Precautions:  Precautions Precautions: Fall Precaution Comments: R hemi (UE>LE); dysphagia 1 diet with nectar thick liquids Restrictions Weight Bearing Restrictions: No General:     Therapy/Group: Individual Therapy  Orrin Brigham 04/14/2023, 7:55 AM

## 2023-04-14 NOTE — Progress Notes (Signed)
Speech Language Pathology Daily Session Note  Patient Details  Name: [name] MRN: [id number] Date of Birth: January 06, 1941  Today's Date: 04/14/2023 SLP Individual Time: 0800-0900 SLP Individual Time Calculation (min): 60 min  Short Term Goals: Week 1: SLP Short Term Goal 1 (Week 1): STGs= LTGs due to ELOS  Skilled Therapeutic Interventions:  Patient was seen in am to address cognitive re- training. Pt was alert and seen at bedside upon SLP arrival. She c/o pain and was agreeable to transfer to recliner for remainder of session. Pt transferred to recliner given sup A for safety. Once in recliner, pt was agreeable for trials of thin liquid. She consumed sips of thin liquid via cup edge with no s/sx pen/asp in trials. SLP recommended diet upgrade to thin liquids. Assigned nurse notified while in and out for medication administration. Pt consumed her pills in applesauce with sips of thin liquid unremarkably. Throughout session SLP addressed temporal, spatial, and situational orientation as well as problem solving. Pt perseverating throughout session on asking when she can leave and discussing working at newspaper. She was easily redirected. Pt not indep oriented to any temporal concepts. Given external visual aid, she identified month and year with min A. She consistently required that level of assist for identifying temporal concepts throughout session. When challenged to manipulate temporal concepts I.e. identifying upcoming month, pt required max A. Pt was consistently oriented to place and situation throughout session. Pt presents with impaired recall of biographical information. Utilizing spaced retrieval techniques, pt recalled her age for up to 2 minutes and DOB for up to 4 minutes. Pt was consistently aware of recommendations not to stand or ambulate indep however initially requiring teaching on call button and examples of utilization as session progressed pt required min to mod A to locate  call button in multiple trials (suspect some R side inattention) however, she verbalized awareness of need for utilization throughout. Pt was transferred back to bed per her request at conclusion of session where she was left with call button within reach and bed alarm active. SLP to continue POC.   Pain Pain Assessment Pain Scale: 0-10 Pain Score: 7  Pain Type: Chronic pain Pain Location: Back Pain Intervention(s): Repositioned  Therapy/Group: Individual Therapy  Renaee Munda 04/14/2023, 11:21 AM

## 2023-04-14 NOTE — Plan of Care (Signed)
  Problem: RH KNOWLEDGE DEFICIT Goal: RH STG INCREASE KNOWLEDGE OF STROKE PROPHYLAXIS Description: Patient and son will be able to manage secondary risks with medications and dietary modification using educational resources independently Outcome: Progressing   Problem: RH KNOWLEDGE DEFICIT Goal: RH STG INCREASE KNOWLEGDE OF HYPERLIPIDEMIA Description: Patient and son will be able to manage HLD with medications and dietary modification using educational resources independently Outcome: Progressing   Problem: RH KNOWLEDGE DEFICIT Goal: RH STG INCREASE KNOWLEDGE OF DYSPHAGIA/FLUID INTAKE Description: Patient and son will be able to manage dysphagia, medications and dietary modification using educational resources independently Outcome: Progressing   Problem: RH KNOWLEDGE DEFICIT Goal: RH STG INCREASE KNOWLEDGE OF HYPERTENSION Description: Patient and son will be able to manage HTN with medications and dietary modification using educational resources independently Outcome: Progressing

## 2023-04-14 NOTE — Progress Notes (Signed)
Orthopedic Tech Progress Note Patient Details:  [name] [birth year] [id number]  Patient ID: Casandra Doffing, female   DOB: Apr 21, 1941, 82 y.o.   MRN: [id number] I called order into [facility]anger for resting hand splint. Trinna Post 04/14/2023, 1:12 PM

## 2023-04-14 NOTE — Progress Notes (Signed)
Patient ID: [name], female   DOB: 04/12/1941, 83 y.o.   MRN: [id number]  Met with pt and spoke with son-James to inform of team conference goals of supervision level due to cognition issues and target discharge date 11/6. He reports he will talk with family and get back with worker regarding ay and time for education. Aware of the three hours and am or pm. Will await return call. Pt is happy to have a discharge date.

## 2023-04-14 NOTE — Patient Care Conference (Signed)
Inpatient RehabilitationTeam Conference and Plan of Care Update Date: 04/14/2023   Time: 11:02 AM    Patient Name: [name]      Medical Record Number: [id number]  Date of Birth: 01/27/1941 Sex: Female         Room/Bed: 4W15C/4W15C-01 Payor Info: Payor: [employer]vertising copywriter MEDICARE / Plan: [facility] MEDICARE / Product Type: *No Product type* /    Admit Date/Time:  04/12/2023  3:26 PM  Primary Diagnosis:  Left middle cerebral artery stroke [facility])  Hospital Problems: Principal Problem:   Left middle cerebral artery stroke [facility])    Expected Discharge Date: Expected Discharge Date: 04/21/23  Team Members Present: Physician leading conference: Dr. Sula Soda Social Worker Present: Dossie Der, LCSW Nurse Present: Chana Bode, RN PT Present: Wynelle Link, PT OT Present: Bretta Bang, OT SLP Present: Jeannie Done, SLP PPS Coordinator present : Fae Pippin, SLP     Current Status/Progress Goal Weekly Team Focus  Bowel/Bladder   cont with some incont of B&B   remain cont   assist with toileting as needed    Swallow/Nutrition/ Hydration   Dys 1 and thin- no cough on cup edge sips. min to mod lingual residue with puree   min A  tolerance of thin, MBS, education    ADL's   confused but super sweet, very focussed on grand children and role, UTI +, min A sit to stand and toilet transfer with grab bar, R hemi dom side, thick liq/D1, blurred vision and R inattention, mod A sink sponge UB, max A LB sponge, max A UB/LB dressing except TEDS dep   Supervision   Family Education and d/c DME/planning    Mobility   CGA bed mobility, CGA to min for stand pivot transfers, minA ambulation >213ft with no AD. Confusion with memory impairments   Supervision  functional gait, dynamic standing balance, safety awareness and improving insight    Communication   50-70% intelligible with low vocal intensity and imprecise articulation   min A   implement compensatory strategeis     Safety/Cognition/ Behavioral Observations  mod to max A with poor carryover, orientation, recall, problem solving, and attention   min to mod A   external visual aids, pt/ caregiver education, prob solving for current environment    Pain   no pain reported   remain pain free   assess pain qshift and prn    Skin   skin intact   skin remain intact  assess skin qshift and prn      Discharge Planning:  Home with so and grandson who work during the day-son to have ex-wife to be there while he is working   Team Discussion: Patient with confusion, and disorientation, low vocal intensity post left MCA CVA.  Limited by hypotension and pain; cannot have tylenol due to transaminitis. Needs cues and external aides for recall, attention and orientation.   Patient on target to meet rehab goals: yes, needs min - mod assist for ADLs and CAGA for stand pivot transfers. Able to ambulate over 200' without an assistive device . Needs mod- max assist for cognition.  Goals for discharge set for supervision overall.  *See Care Plan and progress notes for long and short-term goals.   Revisions to Treatment Plan:  UA C+S MD adjusting medications TENs trial   Teaching Needs: Safety, medications, dietary modification, transfers, toileting, etc.   Current Barriers to Discharge: Decreased caregiver support  Possible Resolutions to Barriers: Family education HH follow up services DME: TBD  Medical Summary Current Status: hypotension, bradycardia, vitamin D insufficiency, low back pain, left MCA CVA, transaminitis  Barriers to Discharge: Medical stability  Barriers to Discharge Comments: hypotension, bradycardia, vitamin D insufficiency, low back pain, left MCA CVA, transaminitis Possible Resolutions to [employer] Focus: decrease Cozaar to 50mg  daily, started vitamin D and B supplement, trialed tens unit, d/c tylenol, continue to monitor BP and HR TID   Continued Need for Acute  Rehabilitation Level of Care: The patient requires daily medical management by a physician with specialized training in physical medicine and rehabilitation for the following reasons: Direction of a multidisciplinary physical rehabilitation program to maximize functional independence : Yes Medical management of patient stability for increased activity during participation in an intensive rehabilitation regime.: Yes Analysis of laboratory values and/or radiology reports with any subsequent need for medication adjustment and/or medical intervention. : Yes   I attest that I was present, lead the team conference, and concur with the assessment and plan of the team.   Chana Bode B 04/14/2023, 3:43 PM

## 2023-04-14 NOTE — Plan of Care (Signed)
  Problem: Consults Goal: RH STROKE PATIENT EDUCATION Description: See Patient Education module for education specifics  Outcome: Progressing   Problem: RH SAFETY Goal: RH STG ADHERE TO SAFETY PRECAUTIONS W/ASSISTANCE/DEVICE Description: STG Adhere to Safety Precautions With cues Assistance/Device. Outcome: Progressing   Problem: RH KNOWLEDGE DEFICIT Goal: RH STG INCREASE KNOWLEDGE OF HYPERTENSION Description: Patient and son will be able to manage HTN with medications and dietary modification using educational resources independently Outcome: Progressing Goal: RH STG INCREASE KNOWLEDGE OF DYSPHAGIA/FLUID INTAKE Description: Patient and son will be able to manage dysphagia, medications and dietary modification using educational resources independently Outcome: Progressing Goal: RH STG INCREASE KNOWLEGDE OF HYPERLIPIDEMIA Description: Patient and son will be able to manage HLD with medications and dietary modification using educational resources independently Outcome: Progressing Goal: RH STG INCREASE KNOWLEDGE OF STROKE PROPHYLAXIS Description: Patient and son will be able to manage secondary risks with medications and dietary modification using educational resources independently Outcome: Progressing   Problem: RH Vision Goal: RH LTG Vision (Specify) Outcome: Progressing

## 2023-04-14 NOTE — Progress Notes (Signed)
PROGRESS NOTE   Subjective/Complaints: No new complaints this morning initially, when asked she does endorse low back pain and would like to try tens unit Hypotensive and bradycardic  ROS: +low back pain   Objective:   No results found. Recent Labs    04/13/23 0532  WBC 5.0  HGB 11.9*  HCT 36.4  PLT 182   Recent Labs    04/13/23 0532  NA 138  K 4.0  CL 102  CO2 25  GLUCOSE 107*  BUN 14  CREATININE 0.86  CALCIUM 9.3    Intake/Output Summary (Last 24 hours) at 04/14/2023 0936 Last data filed at 04/14/2023 0755 Gross per 24 hour  Intake 354 ml  Output --  Net 354 ml        Physical Exam: Vital Signs Blood pressure 122/61, pulse (!) 46, temperature (!) 97.5 F (36.4 C), temperature source Oral, resp. rate 16, height 5\' 4"  (1.626 m), weight 57.4 kg, SpO2 99%. Gen: no distress, normal appearing HEENT: oral mucosa pink and moist, NCAT Cardio: Bradycardia Chest: normal effort, normal rate of breathing Abd: soft, non-distended Ext: no edema Psych: pleasant, normal affect Skin: intact Neurologic: Cranial nerves II through XII intact, motor strength is 5/5 in Left  deltoid, bicep, tricep, grip, 5/5 Bilateral hip flexor, knee extensors, ankle dorsiflexor and plantar flexor RUE 1+ grip, 1+ bi, 2/5 tri, 0/5 delt, exam stable 10/30 Sensory exam normal sensation to light touch and proprioception in bilateral upper and lower extremities Cerebellar exam Limited RUE due to weakness Musculoskeletal: Full range of motion in all 4 extremities. No joint swelling. Patient is alert and makes eye contact with examiner.  She follows simple commands.  She can name hospital and family members but not age or time.   speech without dysarthria or aphasia     Assessment/Plan: 1. Functional deficits which require 3+ hours per day of interdisciplinary therapy in a comprehensive inpatient rehab setting. Physiatrist is providing  close team supervision and 24 hour management of active medical problems listed below. Physiatrist and rehab team continue to assess barriers to discharge/monitor patient progress toward functional and medical goals  Care Tool:  Bathing    Body parts bathed by patient: Right arm, Chest, Abdomen, Front perineal area, Face   Body parts bathed by helper: Buttocks, Left lower leg, Right lower leg, Left upper leg, Right upper leg, Left arm     Bathing assist Assist Level: Maximal Assistance - Patient 24 - 49%     Upper Body Dressing/Undressing Upper body dressing   What is the patient wearing?: Pull over shirt, Bra    Upper body assist Assist Level: Maximal Assistance - Patient 25 - 49%    Lower Body Dressing/Undressing Lower body dressing      What is the patient wearing?: Underwear/pull up, Pants     Lower body assist Assist for lower body dressing: Maximal Assistance - Patient 25 - 49%     Toileting Toileting    Toileting assist Assist for toileting: Moderate Assistance - Patient 50 - 74%     Transfers Chair/bed transfer  Transfers assist     Chair/bed transfer assist level: Minimal Assistance - Patient > 75%  Locomotion Ambulation   Ambulation assist      Assist level: Minimal Assistance - Patient > 75% Assistive device: No Device Max distance: 150ft   Walk 10 feet activity   Assist     Assist level: Minimal Assistance - Patient > 75% Assistive device: No Device   Walk 50 feet activity   Assist    Assist level: Minimal Assistance - Patient > 75% Assistive device: No Device    Walk 150 feet activity   Assist    Assist level: Minimal Assistance - Patient > 75% Assistive device: No Device    Walk 10 feet on uneven surface  activity   Assist     Assist level: Minimal Assistance - Patient > 75%     Wheelchair     Assist Is the patient using a wheelchair?: No             Wheelchair 50 feet with 2 turns  activity    Assist            Wheelchair 150 feet activity     Assist          Blood pressure 122/61, pulse (!) 46, temperature (!) 97.5 F (36.4 C), temperature source Oral, resp. rate 16, height 5\' 4"  (1.626 m), weight 57.4 kg, SpO2 99%.  Medical Problem List and Plan: 1. Functional deficits secondary to left MCA territory infarction with left M1 occlusion status post revascularization             -patient may shower             -ELOS/Goals: 10-14d Sup Mobility and ADLs   Metanx started 2.  Antithrombotics: -DVT/anticoagulation:  Pharmaceutical: Eliquis             -antiplatelet therapy: N/A 3. Low back pain: Prescribing Home Zynex NexWave Stimulator Device and supplies as needed. IFC, NMES and TENS medically necessary Treatment Rx: Daily @ 30-40 minutes per treatment PRN. Zynex NexWave only, no substitutions. Treatment Goals: 1) To reduce and/or eliminate pain 2) To improve functional capacity and Activities of daily living 3) To reduce or prevent the need for oral medications 4) To improve circulation in the injured region 5) To decrease or prevent muscle spasm and muscle atrophy 6) To provide a self-management tool to the patient The patient has not sufficiently improved with conservative care. Numerous studies indexed by Medline and PubMed.gov have shown Neuromuscular, Interferential, and TENS stimulators to reduce pain, improve function, and reduce medication use in injured patients. Continued use of this evidence based, safe, drug free treatment is both reasonable and medically necessary at this time.  4. Mood/Behavior/Sleep: Provide emotional support             -antipsychotic agents: N/A 5. Neuropsych/cognition: This patient is not capable of making decisions on her own behalf. 6. Skin/Wound Care: Routine skin checks 7. Fluids/Electrolytes/Nutrition: Routine in and outs with follow-up chemistries 8.  New onset atrial fibrillation.  Continue Eliquis.  Cardiac rate  controlled 9.  Hypertension.  Clonidine 0.1 mg twice daily, Cozaar decreased to 50mg  daily due to hypotension.  Monitor with increased mobility 10.  Hyperlipidemia.  Lipitor  11.  Dysphagia.  Dysphagia #1 nectar liquid.  12. Bradycardia: continue to monitor HR TID, decrease Cozaar to 50mg  daily  13. Screening for vitamin D deficiency: add on Vitamin D level today  14. Hyperglycemia: d/c Ensure and made goal to focus on eating regular meals  15. Transaminitis: d/c Tylenol  16. Vitamin D deficiency: ergocalciferol 50,000U  once per week started    LOS: 2 days A FACE TO FACE EVALUATION WAS PERFORMED  [name] 04/14/2023, 9:36 AM

## 2023-04-14 NOTE — Progress Notes (Signed)
Occupational Therapy Session Note  Patient Details  Name: [name] MRN: [id number] Date of Birth: [birth year]  Today's Date: 04/14/2023 OT Individual Time: 1000-1058 OT Individual Time Calculation (min): 58 min    Short Term Goals: Week 1:  OT Short Term Goal 1 (Week 1): STG=LTG based on LOS  Skilled Therapeutic Interventions/Progress Updates:    Patient received seated in recliner.  Patient disoriented and confused.  Need to determine baseline cognition for patient.  Patient asking about her grandsons, trying to make phone call on call bell.  Repeatedly asking same questions and not recalling responses.  In functional context patient did better cognitively.  Needing mod assist for BADL while seated at sink.  Very clearly declined shower - "maybe tomorrow."  Messaged MD and requested resting hand splint for night.  Patient returned to bed with alarm engaged and call bell in reach.    Therapy Documentation Precautions:  Precautions Precautions: Fall Precaution Comments: R hemi (UE>LE); dysphagia 1 diet with nectar thick liquids Restrictions Weight Bearing Restrictions: No  Pain: Pain Assessment Pain Scale: 0-10 Pain Score: No pain indicated       Therapy/Group: Individual Therapy  Collier Salina 04/14/2023, 12:55 PM

## 2023-04-15 DIAGNOSIS — I63512 Cerebral infarction due to unspecified occlusion or stenosis of left middle cerebral artery: Secondary | ICD-10-CM | POA: Diagnosis not present

## 2023-04-15 MED ORDER — MELATONIN 3 MG PO TABS
3.0000 mg | ORAL_TABLET | Freq: Every day | ORAL | Status: DC
  Administered 2023-04-15 – 2023-04-20 (×6): 3 mg via ORAL
  Filled 2023-04-15 (×6): qty 1

## 2023-04-15 MED ORDER — LOSARTAN POTASSIUM 25 MG PO TABS
25.0000 mg | ORAL_TABLET | Freq: Every day | ORAL | Status: DC
  Administered 2023-04-16 – 2023-04-20 (×5): 25 mg via ORAL
  Filled 2023-04-15 (×5): qty 1

## 2023-04-15 MED ORDER — ACETAMINOPHEN 325 MG PO TABS
650.0000 mg | ORAL_TABLET | Freq: Four times a day (QID) | ORAL | Status: DC | PRN
  Administered 2023-04-15 – 2023-04-20 (×7): 650 mg via ORAL
  Filled 2023-04-15 (×7): qty 2

## 2023-04-15 NOTE — Progress Notes (Signed)
Occupational Therapy Session Note  Patient Details  Name: [name] MRN: [id number] Date of Birth: Dec 24, 1940  Today's Date: 04/15/2023 OT Individual Time: 7829-5621 OT Individual Time Calculation (min): 58 min    Short Term Goals: Week 1:  OT Short Term Goal 1 (Week 1): STG=LTG based on LOS  Skilled Therapeutic Interventions/Progress Updates:    Patient received seated in recliner agreeable to shower.  Ambulated to shower - continent void on toilet.  Needs assist for removing pants and socks - hesitant for forward bending.  Patient notes blurred vision right eye.   Patient consistently asks same question throughout session - easily redirected, but persistent.  Patient transitions sit to stand with close supervision, min assist to navigate around items in room.  Mod assist for bimanual dressing tasks - limited attempts at functional use in dominant RUE.  Patient transported to gym to address RUE attention, grasp and release.  Patient issued pink foam to manipulate in RUE.  Will reinforce in subsequent sessions.  Left up in recliner with safety belt in place and engaged, call bell in lap.  Called patient's grandson, Apolinar Junes and allowed patient to talk with him for a few minutes which seemed to calm her.    Therapy Documentation Precautions:  Precautions Precautions: Fall Precaution Comments: R hemi (UE>LE) Restrictions Weight Bearing Restrictions: No   Pain: Pain Assessment Pain Score: 3/10 left hip    Therapy/Group: Individual Therapy  Collier Salina 04/15/2023, 9:43 AM

## 2023-04-15 NOTE — Progress Notes (Signed)
Physical Therapy Session Note  Patient Details  Name: [name] MRN: [id number] Date of Birth: March 04, 1941  Today's Date: 04/15/2023 PT Individual Time: 8657-8469 + 1415-1511 PT Individual Time Calculation (min): 24 min  + 54 min  Short Term Goals: Week 1:  PT Short Term Goal 1 (Week 1): STG = LTG due to ELOS  Skilled Therapeutic Interventions/Progress Updates:      1st session: Pt sitting in recliner in hospital gown on arrival. Still confused, oriented to self and situation only. Reoriented and patient perseverating on calling her son/grandson. Used housephone to call them to help with the reorientation process. Pt completed UB/LB dressing with min/modA for time management. Sit<>stand from recliner with CGA/minA and ambulated to the bathroom with minA and no AD. Pt needing assist for lowering underwear and pants in standing, continent of bladder void. LPN present for morning medications - patient took these in standing to continue working on activity tolerance, awareness, and insight into deficits - CGA/minA for static standing balance with R lean, narrow BOS. Completed oral care and brushing her hair at the sink with setupA, minA for balance. Returned to her recliner at end of treatment, left with seat belt alarm on and all needs met.    2nd session: Pt sleeping soundly in bed on arrival - awakens to voice. She reports high levels of fatigue, has done a lot in therapy today.   Supine<>Sitting EOB with minA, primarily for initiation. Ambulated to the bathroom with minA and no AD for toileting - pt continent of bladder void - needing assist for lowering/raising underwear/pants in standing and also for managing toilet paper from roll.   Ambulated to day room rehab gym with similar assist, ~175ft, no AD. Cues for bringing awareness to her R side, upright posture, and widening her BOS. Due to her confusion and poor cognition, decreased recall strategies during session.  She participated  in the large Group Dance, mostly at chair level. She needed hand-over-hand assist for [facility] for her RUE during all therapeutic activity. Pt highly distractible with poor frustration tolerance during activity.  She also participated in standing dance with B HHA and minA for stability. Patient completed "twirls/turns", side stepping, trunk rotations, and rhythmic movement patterns. R visual field deficit impacting this activity.  Returned to her room due to fatigue and patient politely requesting to lie down. Pt continent of bladder void for a 2nd time with same assist as above. Returned to bed with bed mobility completed at supervision level. Pt quickly closing her eyes and resting. She missed the last 21 minutes of therapy due to fatigue.   Therapy Documentation Precautions:  Precautions Precautions: Fall Precaution Comments: R hemi (UE>LE) Restrictions Weight Bearing Restrictions: No General:     Therapy/Group: Individual Therapy  Chinmay Squier P [place]  PT, DPT, CSRS  04/15/2023, 7:53 AM

## 2023-04-15 NOTE — Progress Notes (Signed)
Ok to stop keflex per Dr. Carlis Abbott. Only 10k colonies.  Ulyses Southward, PharmD, BCIDP, AAHIVP, CPP Infectious Disease Pharmacist 04/15/2023 2:35 PM

## 2023-04-15 NOTE — Progress Notes (Signed)
Speech Language Pathology Daily Session Note  Patient Details  Name: [name] MRN: [id number] Date of Birth: February 11, 1941  Today's Date: 04/15/2023 SLP Individual Time: 1004-1100 SLP Individual Time Calculation (min): 56 min  Short Term Goals: Week 1: SLP Short Term Goal 1 (Week 1): STGs= LTGs due to ELOS  Skilled Therapeutic Interventions: SLP conducted skilled therapy session targeting cognitive retraining goals. Patient disoriented to date/month, stating "I know it's after April 1st." Patient overall mod-max assist for orientation throughout session and benefited from reorientation frequently. SLP facilitated calendar task with patient benefiting from max cues to locate today's date, discharge date, and estimated remaining length of stay. During sorting task, patient required mod assist for attention to task and max-total assist to recall purpose/directions to task. Patient had difficulty attending to piles of sorted objects in the right visual field greater than left. Patient was left in lowered bed with call bell in reach and bed alarm set. SLP will continue to target goals per plan of care.       Pain Pain Assessment Pain Scale: 0-10 Pain Score: 10-Worst pain ever Pain Location: Leg Pain Intervention(s): Repositioned  Therapy/Group: Individual Therapy  Jeannie Done, M.A., CCC-SLP  Yetta Barre 04/15/2023, 10:32 AM

## 2023-04-15 NOTE — Plan of Care (Signed)
  Problem: RH KNOWLEDGE DEFICIT Goal: RH STG INCREASE KNOWLEGDE OF HYPERLIPIDEMIA Description: Patient and son will be able to manage HLD with medications and dietary modification using educational resources independently Outcome: Progressing   Problem: RH KNOWLEDGE DEFICIT Goal: RH STG INCREASE KNOWLEDGE OF STROKE PROPHYLAXIS Description: Patient and son will be able to manage secondary risks with medications and dietary modification using educational resources independently Outcome: Progressing   Problem: RH KNOWLEDGE DEFICIT Goal: RH STG INCREASE KNOWLEDGE OF DYSPHAGIA/FLUID INTAKE Description: Patient and son will be able to manage dysphagia, medications and dietary modification using educational resources independently Outcome: Progressing

## 2023-04-15 NOTE — Progress Notes (Signed)
PROGRESS NOTE   Subjective/Complaints: No new complaints this morning Resting hand splint ordered Melatonin ordered HS as per night nurse's request  ROS: +low back pain, +insomnia   Objective:   No results found. Recent Labs    04/13/23 0532  WBC 5.0  HGB 11.9*  HCT 36.4  PLT 182   Recent Labs    04/13/23 0532  NA 138  K 4.0  CL 102  CO2 25  GLUCOSE 107*  BUN 14  CREATININE 0.86  CALCIUM 9.3    Intake/Output Summary (Last 24 hours) at 04/15/2023 0959 Last data filed at 04/15/2023 0700 Gross per 24 hour  Intake 598 ml  Output --  Net 598 ml        Physical Exam: Vital Signs Blood pressure (!) 120/53, pulse 72, temperature 97.6 F (36.4 C), resp. rate 16, height 5\' 4"  (1.626 m), weight 57.4 kg, SpO2 100%. Gen: no distress, normal appearing HEENT: oral mucosa pink and moist, NCAT Cardio: Bradycardia Chest: normal effort, normal rate of breathing Abd: soft, non-distended Ext: no edema Psych: pleasant, normal affect Skin: intact Neurologic: Cranial nerves II through XII intact, motor strength is 5/5 in Left  deltoid, bicep, tricep, grip, 5/5 Bilateral hip flexor, knee extensors, ankle dorsiflexor and plantar flexor RUE 1+ grip, 1+ bi, 2/5 tri, 0/5 delt, exam stable 10/30 Sensory exam normal sensation to light touch and proprioception in bilateral upper and lower extremities Cerebellar exam Limited RUE due to weakness Musculoskeletal: Full range of motion in all 4 extremities. No joint swelling. Patient is alert and makes eye contact with examiner.  She follows simple commands.  She can name hospital and family members but not age or time.   speech without dysarthria or aphasia, exam stable 10/31     Assessment/Plan: 1. Functional deficits which require 3+ hours per day of interdisciplinary therapy in a comprehensive inpatient rehab setting. Physiatrist is providing close team supervision and 24  hour management of active medical problems listed below. Physiatrist and rehab team continue to assess barriers to discharge/monitor patient progress toward functional and medical goals  Care Tool:  Bathing    Body parts bathed by patient: Right arm, Chest, Abdomen, Front perineal area, Face   Body parts bathed by helper: Buttocks, Left lower leg, Right lower leg, Left upper leg, Right upper leg, Left arm     Bathing assist Assist Level: Maximal Assistance - Patient 24 - 49%     Upper Body Dressing/Undressing Upper body dressing   What is the patient wearing?: Pull over shirt, Bra    Upper body assist Assist Level: Maximal Assistance - Patient 25 - 49%    Lower Body Dressing/Undressing Lower body dressing      What is the patient wearing?: Underwear/pull up, Pants     Lower body assist Assist for lower body dressing: Maximal Assistance - Patient 25 - 49%     Toileting Toileting    Toileting assist Assist for toileting: Moderate Assistance - Patient 50 - 74%     Transfers Chair/bed transfer  Transfers assist     Chair/bed transfer assist level: Minimal Assistance - Patient > 75%     Locomotion Ambulation  Ambulation assist      Assist level: Minimal Assistance - Patient > 75% Assistive device: No Device Max distance: 11ft   Walk 10 feet activity   Assist     Assist level: Minimal Assistance - Patient > 75% Assistive device: No Device   Walk 50 feet activity   Assist    Assist level: Minimal Assistance - Patient > 75% Assistive device: No Device    Walk 150 feet activity   Assist    Assist level: Minimal Assistance - Patient > 75% Assistive device: No Device    Walk 10 feet on uneven surface  activity   Assist     Assist level: Minimal Assistance - Patient > 75%     Wheelchair     Assist Is the patient using a wheelchair?: No             Wheelchair 50 feet with 2 turns activity    Assist             Wheelchair 150 feet activity     Assist          Blood pressure (!) 120/53, pulse 72, temperature 97.6 F (36.4 C), resp. rate 16, height 5\' 4"  (1.626 m), weight 57.4 kg, SpO2 100%.  Medical Problem List and Plan: 1. 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  Antithrombotics: -DVT/anticoagulation:  Pharmaceutical: Eliquis             -antiplatelet therapy: N/A 3. Low back pain: Prescribed home Zynex 4. Mood/Behavior/Sleep: Provide emotional support             -antipsychotic agents: N/A 5. Neuropsych/cognition: This patient is not capable of making decisions on her own behalf. 6. Skin/Wound Care: Routine skin checks 7. Fluids/Electrolytes/Nutrition: Routine in and outs with follow-up chemistries 8.  New onset atrial fibrillation.  Continue Eliquis.  Cardiac rate controlled 9.  Hypertension.  Clonidine 0.1 mg twice daily, decrease Cozaar to 25mg  given hypotension.  Monitor with increased mobility 10.  Hyperlipidemia.  Lipitor  11.  Dysphagia.  Dysphagia #1 nectar liquid.  12. Bradycardia: continue to monitor HR TID, decrease Cozaar to 50mg  daily, resolved.   13. Cognitive deficits: continue SLP  14. Hyperglycemia: d/c Ensure and made goal to focus on eating regular meals  15. Transaminitis: d/c Tylenol  16. Vitamin D deficiency: ergocalciferol 50,000U once per week started, continue  17. Right wrist drop: resting hand splint ordered  18. Insomnia: melatonin 3mg  started HS    LOS: 3 days A FACE TO FACE EVALUATION WAS PERFORMED  Clint Bolder P Akiba Melfi 04/15/2023, 9:59 AM

## 2023-04-15 NOTE — IPOC Note (Signed)
Overall Plan of Care [facility]) Patient Details Name: [name] MRN: [id number] DOB: 1941-03-13  Admitting Diagnosis: Left middle cerebral artery stroke [facility])  Hospital Problems: Principal Problem:   Left middle cerebral artery stroke (HCC)     Functional Problem List: Nursing Safety, Endurance, Medication Management, Nutrition  PT Balance, Endurance, Motor, Safety, Skin Integrity  OT Balance, Nutrition, Vision, Behavior, Pain, Cognition, Perception, Edema, Safety, Endurance, Sensory, Motor, Skin Integrity  SLP Cognition, Nutrition, Motor, Safety  TR         Basic ADL's: OT Eating, Grooming, Bathing, Dressing, Toileting     Advanced  ADL's: OT Simple Meal Preparation, Laundry, Light Housekeeping     Transfers: PT Bed Mobility, Bed to Chair, Customer service manager, Tub/Shower     Locomotion: PT Ambulation, Stairs     Additional Impairments: OT Fuctional Use of Upper Extremity  SLP Swallowing, Communication, Social Cognition   Social Interaction, Problem Solving, Memory, Attention, Awareness  TR      Anticipated Outcomes Item Anticipated Outcome  Self Feeding Supervision  Swallowing  Min A   Basic self-care  Marketing executive Transfers Supervision  Bowel/Bladder  n/a  Transfers  supervision  Locomotion  supervision  Communication  Min A  Cognition  Mod A  Pain  n/a  Safety/Judgment  manage w cues   Therapy Plan: PT Intensity: Minimum of 1-2 x/day ,45 to 90 minutes PT Frequency: 5 out of 7 days PT Duration Estimated Length of Stay: 7-9 days OT Intensity: Minimum of 1-2 x/day, 45 to 90 minutes OT Frequency: 5 out of 7 days OT Duration/Estimated Length of Stay: 7-9 days SLP Intensity: Minumum of 1-2 x/day, 30 to 90 minutes SLP Frequency: 3 to 5 out of 7 days SLP Duration/Estimated Length of Stay: 7-10 days   Team Interventions: Nursing Interventions Patient/Family Education, Dysphagia/Aspiration Precaution Training,  Medication Management, Discharge Planning, Disease Management/Prevention  PT interventions Ambulation/gait training, Cognitive remediation/compensation, Discharge planning, DME/adaptive equipment instruction, Functional mobility training, Pain management, Psychosocial support, Splinting/orthotics, Therapeutic Activities, UE/LE Strength taining/ROM, Visual/perceptual remediation/compensation, Warden/ranger, Community reintegration, Disease management/prevention, Development worker, international aid stimulation, Neuromuscular re-education, Patient/family education, Skin care/wound management, Stair training, Therapeutic Exercise, UE/LE Coordination activities, Wheelchair propulsion/positioning  OT Interventions Warden/ranger, Community reintegration, Disease mangement/prevention, Development worker, international aid stimulation, Neuromuscular re-education, Patient/family education, Self Care/advanced ADL retraining, Splinting/orthotics, Therapeutic Exercise, UE/LE Coordination activities, Wheelchair propulsion/positioning, Cognitive remediation/compensation, Discharge planning, DME/adaptive equipment instruction, Functional mobility training, Pain management, Psychosocial support, Skin care/wound managment, Therapeutic Activities, UE/LE Strength taining/ROM, Visual/perceptual remediation/compensation  SLP Interventions Cognitive remediation/compensation, Dysphagia/aspiration precaution training, Internal/external aids, Speech/Language facilitation, Cueing hierarchy, Therapeutic Activities, Patient/family education, Multimodal communication approach, Functional tasks  TR Interventions    SW/CM Interventions Discharge Planning, Psychosocial Support, Patient/Family Education   Barriers to Discharge MD  Medical stability  Nursing Home environment access/layout 1 level 3 ste w son/grand son; will work out 24/7 supervision as they work  PT Decreased caregiver support, Curator, Lack of/limited  family support, Community education officer for SNF coverage, Behavior Confusion/cognition  OT Inaccessible home environment, Home environment access/layout 3 STE  SLP      SW Insurance for SNF coverage, Lack of/limited family support     Team Discharge Planning: Destination: PT-Home ,OT- Home , SLP-Home Projected Follow-up: PT-24 hour supervision/assistance, Home health PT, OT-  Home health OT, SLP-24 hour supervision/assistance, Outpatient SLP, Home Health SLP Projected Equipment Needs: PT-To be determined, OT- 3 in 1 bedside comode, Tub/shower bench, SLP-None recommended by SLP Equipment Details: PT- , OT-commode,  TTB Patient/family involved in discharge planning: PT- Patient,  OT-Patient, Family member/caregiver, SLP-Patient unable/family or caregive not available  MD ELOS: 10-14 days Medical Rehab Prognosis:  Excellent Assessment: The patient has been admitted for CIR therapies with the diagnosis of left MCA CVA. The team will be addressing functional mobility, strength, stamina, balance, safety, adaptive techniques and equipment, self-care, bowel and bladder mgt, patient and caregiver education. Goals have been set at [employer]. Anticipated discharge destination is home.        See Team Conference Notes for weekly updates to the plan of care

## 2023-04-15 NOTE — Progress Notes (Signed)
Patient ID: [name], female   DOB: 1940/09/26, 82 y.o.   MRN: [id number] Spoke with son who reports he can be here Monday @ 9:00-12:00. Have let team know

## 2023-04-15 NOTE — Plan of Care (Signed)
  Problem: Consults Goal: RH STROKE PATIENT EDUCATION Description: See Patient Education module for education specifics  Outcome: Progressing   Problem: RH SAFETY Goal: RH STG ADHERE TO SAFETY PRECAUTIONS W/ASSISTANCE/DEVICE Description: STG Adhere to Safety Precautions With cues Assistance/Device. Outcome: Progressing   Problem: RH KNOWLEDGE DEFICIT Goal: RH STG INCREASE KNOWLEDGE OF HYPERTENSION Description: Patient and son will be able to manage HTN with medications and dietary modification using educational resources independently Outcome: Progressing Goal: RH STG INCREASE KNOWLEDGE OF DYSPHAGIA/FLUID INTAKE Description: Patient and son will be able to manage dysphagia, medications and dietary modification using educational resources independently Outcome: Progressing Goal: RH STG INCREASE KNOWLEGDE OF HYPERLIPIDEMIA Description: Patient and son will be able to manage HLD with medications and dietary modification using educational resources independently Outcome: Progressing Goal: RH STG INCREASE KNOWLEDGE OF STROKE PROPHYLAXIS Description: Patient and son will be able to manage secondary risks with medications and dietary modification using educational resources independently Outcome: Progressing   Problem: RH Vision Goal: RH LTG Vision (Specify) Outcome: Progressing

## 2023-04-15 NOTE — Progress Notes (Addendum)
Per nurse tech, patient was standing beside the bed and fell onto the bed. Patient safe, stable. No injuries. Son present in the room. Patient stated she was fine.    Tilden Dome, LPN

## 2023-04-15 NOTE — Progress Notes (Signed)
   04/15/23 1243  What Happened  Was fall witnessed? Yes  Who witnessed fall? Nurse tech Loraine Leriche)  Patients activity before fall ambulating-assisted  Point of contact arm/shoulder  Was patient injured? No  Found by Staff-comment (Nurse tech Loraine Leriche))  Stated prior activity other (comment) (ambulating from bathroom back to bed)  Provider Notification  Provider Name/Title Deatra Ina, PA  Date Provider Notified 04/15/23  Time Provider Notified 1243  Method of Notification Face-to-face  Notification Reason Fall  Provider response At bedside  Date of Provider Response 04/15/23  Time of Provider Response 1243  Follow Up  Family notified Yes - comment  Time family notified 1243  Additional tests No  Adult Fall Risk Assessment  Risk Factor Category (scoring not indicated) High fall risk per protocol (document High fall risk)  Patient Fall Risk Level High fall risk  Adult Fall Risk Interventions  Required Bundle Interventions *See Row Information* High fall risk - low, moderate, and high requirements implemented  Additional Interventions Room near nurses station  Screening for Fall Injury Risk (To be completed on HIGH fall risk patients) - Assessing Need for Floor Mats  Risk For Fall Injury- Criteria for Floor Mats Confusion/dementia (+NuDESC, CIWA, TBI, etc.)  Will Implement Floor Mats Yes  Vitals  Temp 97.8 F (36.6 C)  Temp Source Oral  BP 133/80  MAP (mmHg) 96  BP Location Right Arm  BP Method Automatic  Patient Position (if appropriate) Lying  Pulse Rate (!) 59  Pulse Rate Source Monitor  Resp 18  Oxygen Therapy  SpO2 100 %  O2 Device Room Air  Pain Assessment  Pain Scale 0-10  Pain Score 0

## 2023-04-16 ENCOUNTER — Ambulatory Visit: Payer: Medicare Other | Attending: General Practice | Admitting: General Practice

## 2023-04-16 DIAGNOSIS — I63512 Cerebral infarction due to unspecified occlusion or stenosis of left middle cerebral artery: Secondary | ICD-10-CM | POA: Diagnosis not present

## 2023-04-16 NOTE — Plan of Care (Signed)
  Problem: Consults Goal: RH STROKE PATIENT EDUCATION Description: See Patient Education module for education specifics  Outcome: Progressing   Problem: RH SAFETY Goal: RH STG ADHERE TO SAFETY PRECAUTIONS W/ASSISTANCE/DEVICE Description: STG Adhere to Safety Precautions With cues Assistance/Device. Outcome: Progressing   Problem: RH KNOWLEDGE DEFICIT Goal: RH STG INCREASE KNOWLEDGE OF HYPERTENSION Description: Patient and son will be able to manage HTN with medications and dietary modification using educational resources independently Outcome: Progressing Goal: RH STG INCREASE KNOWLEDGE OF DYSPHAGIA/FLUID INTAKE Description: Patient and son will be able to manage dysphagia, medications and dietary modification using educational resources independently Outcome: Progressing Goal: RH STG INCREASE KNOWLEGDE OF HYPERLIPIDEMIA Description: Patient and son will be able to manage HLD with medications and dietary modification using educational resources independently Outcome: Progressing Goal: RH STG INCREASE KNOWLEDGE OF STROKE PROPHYLAXIS Description: Patient and son will be able to manage secondary risks with medications and dietary modification using educational resources independently Outcome: Progressing   Problem: RH Vision Goal: RH LTG Vision (Specify) Outcome: Progressing

## 2023-04-16 NOTE — Progress Notes (Signed)
Speech Language Pathology Daily Session Note  Patient Details  Name: [name] MRN: [id number] Date of Birth: [birth year]  Today's Date: 04/16/2023 SLP Individual Time: 5956-3875 SLP Individual Time Calculation (min): 44 min  Short Term Goals: Week 1: SLP Short Term Goal 1 (Week 1): STGs= LTGs due to ELOS  Skilled Therapeutic Interventions: SLP conducted skilled therapy session targeting cognitive retraining goals. Patient upright in bed finishing breakfast, reporting no difficulty. Notably, straws present at bedside despite 'no straws' recommendation, thus SLP observed patient with straws to assess tolerance. Across thorough trials, no overt s/sx of penetration/aspiration observed. Straws OK for patient at this time. Further, SLP assessed tolerance of upgraded Dys2 solids. Patient reported singular instance of globus sensation but otherwise tolerated well. No oral residue observed. SLP will plan to assess patient with trial tray at next opportunity prior to formal diet upgrade. Throughout session, patient highly forgetful, asking 3 questions repetitively: "Do my grandchildren know where I am?", "Have they brought me any clothes up here to wear?", and "Do you know when I'm getting out of here?" SLP provided patient with printed room sign containing answers to these three questions and began training with patient to utilize sign, referencing sign each time one of these questions was reasked. Patient continued to require mod to max assist by end of session to reference sign. SLP provided supports such as updated calendar and rewrote schedule to increase font size for easy visual access, referencing these external aids throughout session. Patient was left in lowered bed with call bell in reach and bed alarm set. SLP will continue to target goals per plan of care.       Pain Pain Assessment Pain Scale: 0-10 Pain Score: 6  Pain Location: Leg Pain Orientation: Left  Therapy/Group: Individual  Therapy  Jeannie Done, M.A., CCC-SLP  Yetta Barre 04/16/2023, 8:29 AM

## 2023-04-16 NOTE — Progress Notes (Signed)
Occupational Therapy Session Note  Patient Details  Name: [name] MRN: [id number] Date of Birth: 1940/09/21  Today's Date: 04/16/2023 OT Individual Time: 4098-1191 OT Individual Time Calculation (min): 45 min    Short Term Goals: Week 2:  OT Short Term Goal 1 (Week 2): STG=LTG based on LOS  Skilled Therapeutic Interventions/Progress Updates:   Patient received up in wheelchair, agreeable to OT session.  Patient fully dressed in clean clothing.  Patient transported to gym to fabricate right custom wrist splint to promote carpal alignment, prevent edema, and to improve awareness of RUE.  Patient tolerated splint fabrication and indicates splint feels good on hand.  Splint can be worn during the day during rest times, and should be removed for attempts at functional use.  Transferred to mat table and to supine to address proximal RUE movement at shoulder, then shoulder, elbow and forearm.  Patient with improving RUE movement.  Overactive adductors/internal rotators - but did well with cueing to relax shoulder during movemnet.  Did better overall with modified closed chain versus open chain movements.  Left up in wheelchair in room.  Safety belt in place and engaged and call bell in reach.    Therapy Documentation Precautions:  Precautions Precautions: Fall Precaution Comments: R hemi (UE>LE) Restrictions Weight Bearing Restrictions: No  Pain: Pain Assessment Pain Scale: 0-10 Pain Score: 4  Pain Location: Leg Pain Orientation: Left     Therapy/Group: Individual Therapy  Collier Salina 04/16/2023, 10:02 AM

## 2023-04-16 NOTE — Progress Notes (Signed)
PROGRESS NOTE   Subjective/Complaints: No new complaints this morning Urinary frequency improved, discussed that culture had minimal growth and antibotics d/ced  ROS: +low back pain, +insomnia, urinary frequency improved   Objective:   No results found. No results for input(s): "WBC", "HGB", "HCT", "PLT" in the last 72 hours.  No results for input(s): "NA", "K", "CL", "CO2", "GLUCOSE", "BUN", "CREATININE", "CALCIUM" in the last 72 hours.   Intake/Output Summary (Last 24 hours) at 04/16/2023 0930 Last data filed at 04/15/2023 1800 Gross per 24 hour  Intake 236 ml  Output --  Net 236 ml        Physical Exam: Vital Signs Blood pressure (!) 138/51, pulse 62, temperature 98 F (36.7 C), temperature source Oral, resp. rate 16, height 5\' 4"  (1.626 m), weight 57.4 kg, SpO2 100%. Gen: no distress, normal appearing HEENT: oral mucosa pink and moist, NCAT Cardio: Bradycardia Chest: normal effort, normal rate of breathing Abd: soft, non-distended Ext: no edema Psych: pleasant, normal affect Skin: intact Neurologic: Cranial nerves II through XII intact, motor strength is 5/5 in Left  deltoid, bicep, tricep, grip, 5/5 Bilateral hip flexor, knee extensors, ankle dorsiflexor and plantar flexor RUE 1+ grip, 1+ bi, 2/5 tri, 0/5 delt, exam stable 10/30 Sensory exam normal sensation to light touch and proprioception in bilateral upper and lower extremities Cerebellar exam Limited RUE due to weakness Musculoskeletal: Full range of motion in all 4 extremities. No joint swelling. Patient is alert and makes eye contact with examiner.  She follows simple commands.  She can name hospital and family members but not age or time.   speech without dysarthria or aphasia, exam stable 11/1     Assessment/Plan: 1. Functional deficits which require 3+ hours per day of interdisciplinary therapy in a comprehensive inpatient rehab  setting. Physiatrist is providing close team supervision and 24 hour management of active medical problems listed below. Physiatrist and rehab team continue to assess barriers to discharge/monitor patient progress toward functional and medical goals  Care Tool:  Bathing    Body parts bathed by patient: Right arm, Chest, Abdomen, Front perineal area, Face   Body parts bathed by helper: Buttocks, Left lower leg, Right lower leg, Left upper leg, Right upper leg, Left arm     Bathing assist Assist Level: Maximal Assistance - Patient 24 - 49%     Upper Body Dressing/Undressing Upper body dressing   What is the patient wearing?: Pull over shirt, Bra    Upper body assist Assist Level: Maximal Assistance - Patient 25 - 49%    Lower Body Dressing/Undressing Lower body dressing      What is the patient wearing?: Underwear/pull up, Pants     Lower body assist Assist for lower body dressing: Maximal Assistance - Patient 25 - 49%     Toileting Toileting    Toileting assist Assist for toileting: Minimal Assistance - Patient > 75%     Transfers Chair/bed transfer  Transfers assist     Chair/bed transfer assist level: Minimal Assistance - Patient > 75%     Locomotion Ambulation   Ambulation assist      Assist level: Minimal Assistance - Patient > 75% Assistive device:  No Device Max distance: 146ft   Walk 10 feet activity   Assist     Assist level: Minimal Assistance - Patient > 75% Assistive device: No Device   Walk 50 feet activity   Assist    Assist level: Minimal Assistance - Patient > 75% Assistive device: No Device    Walk 150 feet activity   Assist    Assist level: Minimal Assistance - Patient > 75% Assistive device: No Device    Walk 10 feet on uneven surface  activity   Assist     Assist level: Minimal Assistance - Patient > 75%     Wheelchair     Assist Is the patient using a wheelchair?: No             Wheelchair  50 feet with 2 turns activity    Assist            Wheelchair 150 feet activity     Assist          Blood pressure (!) 138/51, pulse 62, temperature 98 F (36.7 C), temperature source Oral, resp. rate 16, height 5\' 4"  (1.626 m), weight 57.4 kg, SpO2 100%.  Medical Problem List and Plan: 1. 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  Antithrombotics: -DVT/anticoagulation:  Pharmaceutical: Eliquis             -antiplatelet therapy: N/A 3. Low back pain: Prescribed home Zynex 4. Mood/Behavior/Sleep: Provide emotional support             -antipsychotic agents: N/A 5. Neuropsych/cognition: This patient is not capable of making decisions on her own behalf. 6. Skin/Wound Care: Routine skin checks 7. Fluids/Electrolytes/Nutrition: Routine in and outs with follow-up chemistries 8.  New onset atrial fibrillation.  Continue Eliquis.  Cardiac rate controlled 9.  Hypertension.  Clonidine 0.1 mg twice daily, decrease Cozaar to 25mg  given hypotension.  Monitor with increased mobility 10.  Hyperlipidemia.  Lipitor  11.  Dysphagia.  Dysphagia #1, advanced to thins  12. Bradycardia: continue to monitor HR TID, decrease Cozaar to 50mg  daily, resolved.   13. Cognitive deficits: continue SLP  14. Hyperglycemia: d/c Ensure and made goal to focus on eating regular meals  15. Transaminitis: d/c Tylenol  16. Vitamin D deficiency: ergocalciferol 50,000U once per week started, continue  17. Right wrist drop: resting hand splint ordered  18. Insomnia: melatonin 3mg  started HS, continue  19. Urinary frequency, improved, d/c antibiotics since culture shows minimal growth.     LOS: 4 days A FACE TO FACE EVALUATION WAS PERFORMED  [name] 04/16/2023, 9:30 AM

## 2023-04-16 NOTE — Progress Notes (Signed)
Physical Therapy Session Note  Patient Details  Name: [name] MRN: [id number] Date of Birth: 11-08-1940  Today's Date: 04/16/2023 PT Individual Time: 4782-9562 PT Individual Time Calculation (min): 74 min   Short Term Goals: Week 1:  PT Short Term Goal 1 (Week 1): STG = LTG due to ELOS  Skilled Therapeutic Interventions/Progress Updates:      Pt supine in bed to start - sleepy and fatigued but agreeable to treatment. No reports of pain. She continues to be pleasantly confused - repeating questions during session "How much longer will I be here? I need to call my grandson." When do I go home?" Pt reoriented throughout session.  Bed mobility completed with supervision assist. Sit<>stand with minA, primarily for initiation, from EOB with no AD. Ambulated with CGA/minA and no AD to day room rehab gym, ~169ft. Worked on gait training to improve wawreness of feet positioning, trunk/postural awareness, and working on R visual scanning. Stopped at the [employer] to practice standing balance, reading newspaper headlines (large print only), etc. 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   Pt participated and instructed in card game to work on memory, attention, and R visual scanning. Tried to get her to play "go-fish" but patient lacked understanding and had difficulty following directions. So game was downgraded to a "Matching" game which she required max cues to complete - some difficulty naming common sea animals on cards (jellyfish, sharks, octopus, etc).   Pt instructed in stair navigation training using 1 hand rail on her L. She required CGA/minA for navigating up the x12 steps but needing heavier minA for navigating down the x12 steps. Patient completes using the 6" steps, while forward facing, with a self selected reciprocal stepping. Tried to educate her on step-to pattern with L foot leading descent and R leading ascent but patient's memory and recall  impacting carryover.   Pt assisted onto the Nustep with minA. Completed 6 minutes using BLE and LUE with resistance set to L4 - patient able to sustain a cadence > 40 steps/minute. 256 steps completed total.  Assisted to the bathroom in her room with minA for toileting. Pt continent of bladder void - charted. Assist needed for managing TP, raising underwear/pants above hips in standing. Concluded session supine in bed with alarm on - tried calling her son per patient request to help with the reorientation but son didn't answer. All needs met at end of treatment.   Therapy Documentation Precautions:  Precautions Precautions: Fall Precaution Comments: R hemi (UE>LE) Restrictions Weight Bearing Restrictions: No General:      Therapy/Group: Individual Therapy  Orrin Brigham 04/16/2023, 7:47 AM

## 2023-04-16 NOTE — Progress Notes (Signed)
Physical Therapy Session Note  Patient Details  Name: [name] MRN: [id number] Date of Birth: February 12, 1941  Today's Date: 04/16/2023 PT Individual Time: 2952-8413 PT Individual Time Calculation (min): 30 min   Short Term Goals: Week 1:  PT Short Term Goal 1 (Week 1): STG = LTG due to ELOS  Skilled Therapeutic Interventions/Progress Updates: Pt presents supine in bed and agreeable to therapy.  Pt transfers sup to sit w/ CGA and cueing to continue transfer until sitting EOB w/ feet supported.  Pt sat EOB for doffing gown and donning pull-over shirt w/ min/mod A.  Pt transfers sit to stand w/ CGA and min A to doff underwear.  Pt returned to sitting and completed donning underwear and pants w/ A to thread over feet.  Pt amb to BR w/ min A HHA and continent of bladder in toilet, min A for clothing management.  Pt amb x 150' w/o AD and cues for posture.  Pt returned to room and remained sitting in w/c w/ chair alarm on and all needs in reach.     Therapy Documentation Precautions:  Precautions Precautions: Fall Precaution Comments: R hemi (UE>LE) Restrictions Weight Bearing Restrictions: No General:   Vital Signs: Therapy Vitals Temp: 98 F (36.7 C) Temp Source: Oral Pulse Rate: 62 BP: (!) 138/51 Patient Position (if appropriate): Sitting Oxygen Therapy SpO2: 100 % O2 Device: Room Air Pain:4/10 Pain Assessment Pain Scale: 0-10 Pain Score: 6  Pain Location: Leg Pain Orientation: Left    Therapy/Group: Individual Therapy  Lucio Edward 04/16/2023, 9:16 AM

## 2023-04-17 DIAGNOSIS — I63512 Cerebral infarction due to unspecified occlusion or stenosis of left middle cerebral artery: Secondary | ICD-10-CM | POA: Diagnosis not present

## 2023-04-17 NOTE — Progress Notes (Signed)
Patient [name] and keeps attempting to exit the bed every 5 or so minutes to use the bathroom. Patient is toileted each time with very low volume, bladder scan completed and result is 15. Patient states she just feels the need to "go" all the time and has a burning sensation. Telesitter repeatedly attempting to redirect as well and calling frequently because patient is constantly trying to exit the bed. Bed left in lowest position, middle bed alarm setting and call light within reach.

## 2023-04-17 NOTE — Progress Notes (Signed)
Speech Language Pathology Daily Session Note  Patient Details  Name: ZADIA UHDE MRN: [id number] Date of Birth: [birth year]  Today's Date: 04/17/2023 SLP Individual Time: 1200-1300 SLP Individual Time Calculation (min): 60 min  Short Term Goals: Week 1: SLP Short Term Goal 1 (Week 1): STGs= LTGs due to ELOS  Skilled Therapeutic Interventions: Skilled therapy session focused on dysphagia and cognitive goals. SLP facilitated session by observing patient consume lunch tray consisting of D2/thin liquid textured items. Patient with mild-moderate R buccal pocketing and x2 immediate cough after liquid wash indicative of possible bolus misdirection. Patient with consistent reports of globus sensation after bites of D2 textures and trial of whole pills in thin liquids. Recommend continuation of D1/thin liquid diet and medications whole in puree. Continue full supervision due to patients poor cognition. Recommend continuation of standardized precautions including small bites/sips, slow rate and sitting upright at 90 degrees. Continue oral care before and after meals, intermittent liquid wash and check for R buccal pocketing. SLP addressed cognition through orientation task. Patient independently oriented to self and situation, however continually asking about grandchildren, where she is, and when she will be able to leave. SLP provided cues to review external memory aid paper on bed. Patient left in bed with call bell in reach and alarm set. Continue POC.   Pain Patient with reports of pain in back and leg. SLP re-positioned and notified RN  Therapy/Group: Individual Therapy  Mittie Knittel M.A., CF-SLP 04/17/2023, 11:58 AM

## 2023-04-17 NOTE — Progress Notes (Signed)
Occupational Therapy Note  Patient Details  Name: [name] MRN: [id number] Date of Birth: 19-Aug-1940  Today's Date: 04/17/2023 OT Missed Time: 60 Minutes Missed Time Reason: Unavailable (comment) (Ran over with eval)  Patient routine visit missed secondary to evaluation of patient's  prior to her visit. Patient added to caseload as a routine visit for 04/18/23   Lavona Mound 04/17/2023, 3:46 PM

## 2023-04-17 NOTE — Progress Notes (Signed)
Physical Therapy Session Note  Patient Details  Name: [name] MRN: [id number] Date of Birth: 1940/09/28  Today's Date: 04/17/2023 PT Individual Time: 1420-1528 PT Individual Time Calculation (min): 68 min   Short Term Goals: Week 1:  PT Short Term Goal 1 (Week 1): STG = LTG due to ELOS  Skilled Therapeutic Interventions/Progress Updates:  Pt was seen bedside in the pm toileting with nursing. Pt performed multiple sit to stand and stand pivot transfers with min A and verbal cues. Pt ambulated 140 feet x 3 and 15 x 2 with no device and min A with verbal cues. Pt performed step taps 3 sets x 10 reps each. Pt returned to room. Pt performed toileting with contact guard to min A and verbal cues. Pt transitioned from edge of bed to supine with min A and verbal cues. Pt left sitting up in bed with bed alarm and call bell within reach.   Therapy Documentation Precautions:  Precautions Precautions: Fall Precaution Comments: R hemi (UE>LE) Restrictions Weight Bearing Restrictions: No General:   Pain: Pt c/o pain R LE but not rated.   Therapy/Group: Individual Therapy  Rayford Halsted 04/17/2023, 3:32 PM

## 2023-04-17 NOTE — Progress Notes (Signed)
PROGRESS NOTE   Subjective/Complaints:  Pt reports feels good- wants to go home- but understands d/c is this week- cannot move d/c up on weekend.  LBM yesterday  ROS:   Pt denies SOB, abd pain, CP, N/V/C/D, and vision changes  Objective:   No results found. No results for input(s): "WBC", "HGB", "HCT", "PLT" in the last 72 hours.  No results for input(s): "NA", "K", "CL", "CO2", "GLUCOSE", "BUN", "CREATININE", "CALCIUM" in the last 72 hours.   Intake/Output Summary (Last 24 hours) at 04/17/2023 1331 Last data filed at 04/17/2023 0730 Gross per 24 hour  Intake 358 ml  Output --  Net 358 ml        Physical Exam: Vital Signs Blood pressure (!) 151/68, pulse (!) 53, temperature 98.7 F (37.1 C), resp. rate 17, height 5\' 4"  (1.626 m), weight 57.4 kg, SpO2 100%.    General: awake, alert, appropriate, sitting up in bed- NAD HENT: conjugate gaze; oropharynx moist CV: irregular rhythm, bradycardic  rate; no JVD Pulmonary: CTA B/L; no W/R/R- good air movement GI: soft, NT, ND, (+)BS Psychiatric: extremely flat Neurological: delayed responses  Neurologic: Cranial nerves II through XII intact, motor strength is 5/5 in Left  deltoid, bicep, tricep, grip, 5/5 Bilateral hip flexor, knee extensors, ankle dorsiflexor and plantar flexor RUE 1+ grip, 1+ bi, 2/5 tri, 0/5 delt, exam stable 10/30 Sensory exam normal sensation to light touch and proprioception in bilateral upper and lower extremities Cerebellar exam Limited RUE due to weakness Musculoskeletal: Full range of motion in all 4 extremities. No joint swelling. Patient is alert and makes eye contact with examiner.  She follows simple commands.  She can name hospital and family members but not age or time.   speech without dysarthria or aphasia, exam stable 11/1     Assessment/Plan: 1. Functional deficits which require 3+ hours per day of interdisciplinary therapy in a  comprehensive inpatient rehab setting. Physiatrist is providing close team supervision and 24 hour management of active medical problems listed below. Physiatrist and rehab team continue to assess barriers to discharge/monitor patient progress toward functional and medical goals  Care Tool:  Bathing    Body parts bathed by patient: Right arm, Chest, Abdomen, Front perineal area, Face, Buttocks, Right upper leg, Left upper leg   Body parts bathed by helper: Buttocks, Left lower leg, Right lower leg, Left upper leg, Right upper leg, Left arm Body parts n/a: Right lower leg, Left lower leg, Left arm   Bathing assist Assist Level: Moderate Assistance - Patient 50 - 74%     Upper Body Dressing/Undressing Upper body dressing   What is the patient wearing?: Bra, Pull over shirt    Upper body assist Assist Level: Moderate Assistance - Patient 50 - 74%    Lower Body Dressing/Undressing Lower body dressing      What is the patient wearing?: Underwear/pull up, Pants     Lower body assist Assist for lower body dressing: Moderate Assistance - Patient 50 - 74%     Toileting Toileting    Toileting assist Assist for toileting: Minimal Assistance - Patient > 75%     Transfers Chair/bed transfer  Transfers assist  Chair/bed transfer assist level: Minimal Assistance - Patient > 75%     Locomotion Ambulation   Ambulation assist      Assist level: Minimal Assistance - Patient > 75% Assistive device: No Device Max distance: 145ft   Walk 10 feet activity   Assist     Assist level: Minimal Assistance - Patient > 75% Assistive device: No Device   Walk 50 feet activity   Assist    Assist level: Minimal Assistance - Patient > 75% Assistive device: No Device    Walk 150 feet activity   Assist    Assist level: Minimal Assistance - Patient > 75% Assistive device: No Device    Walk 10 feet on uneven surface  activity   Assist     Assist level: Minimal  Assistance - Patient > 75%     Wheelchair     Assist Is the patient using a wheelchair?: No             Wheelchair 50 feet with 2 turns activity    Assist            Wheelchair 150 feet activity     Assist          Blood pressure (!) 151/68, pulse (!) 53, temperature 98.7 F (37.1 C), resp. rate 17, height 5\' 4"  (1.626 m), weight 57.4 kg, SpO2 100%.  Medical Problem List and Plan: 1. 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  Antithrombotics: -DVT/anticoagulation:  Pharmaceutical: Eliquis             -antiplatelet therapy: N/A 3. Low back pain: Prescribed home Zynex 4. Mood/Behavior/Sleep: Provide emotional support             -antipsychotic agents: N/A 5. Neuropsych/cognition: This patient is not capable of making decisions on her own behalf.  11/2- pt not appropriate to decide if ready for d/c yet 6. Skin/Wound Care: Routine skin checks 7. Fluids/Electrolytes/Nutrition: Routine in and outs with follow-up chemistries 8.  New onset atrial fibrillation.  Continue Eliquis.  Cardiac rate controlled 9.  Hypertension.  Clonidine 0.1 mg twice daily, decrease Cozaar to 25mg  given hypotension.  Monitor with increased mobility 10.  Hyperlipidemia.  Lipitor  11.  Dysphagia.  Dysphagia #1, advanced to thins  12. Bradycardia: continue to monitor HR TID, decrease Cozaar to 50mg  daily, resolved.   11/2- HR is 50s again this AM- cozaar down to 25 mg daily- if stays in 50s, might suggest decreasing Cozaar.  13. Cognitive deficits: continue SLP  14. Hyperglycemia: d/c Ensure and made goal to focus on eating regular meals  15. Transaminitis: d/c Tylenol  16. Vitamin D deficiency: ergocalciferol 50,000U once per week started, continue  17. Right wrist drop:  resting hand splint ordered  18. Insomnia: melatonin 3mg  started HS, continue  11/2- slept well 19. Urinary frequency, improved, d/c antibiotics since culture shows minimal growth.     LOS: 5 days A FACE TO FACE EVALUATION WAS PERFORMED  [name] 04/17/2023, 1:31 PM

## 2023-04-17 NOTE — Plan of Care (Signed)
  Problem: Consults Goal: RH STROKE PATIENT EDUCATION Description: See Patient Education module for education specifics  Outcome: Progressing   Problem: RH SAFETY Goal: RH STG ADHERE TO SAFETY PRECAUTIONS W/ASSISTANCE/DEVICE Description: STG Adhere to Safety Precautions With cues Assistance/Device. Outcome: Progressing   Problem: RH KNOWLEDGE DEFICIT Goal: RH STG INCREASE KNOWLEDGE OF HYPERTENSION Description: Patient and son will be able to manage HTN with medications and dietary modification using educational resources independently Outcome: Progressing Goal: RH STG INCREASE KNOWLEDGE OF DYSPHAGIA/FLUID INTAKE Description: Patient and son will be able to manage dysphagia, medications and dietary modification using educational resources independently Outcome: Progressing Goal: RH STG INCREASE KNOWLEGDE OF HYPERLIPIDEMIA Description: Patient and son will be able to manage HLD with medications and dietary modification using educational resources independently Outcome: Progressing Goal: RH STG INCREASE KNOWLEDGE OF STROKE PROPHYLAXIS Description: Patient and son will be able to manage secondary risks with medications and dietary modification using educational resources independently Outcome: Progressing   Problem: RH Vision Goal: RH LTG Vision (Specify) Outcome: Progressing

## 2023-04-18 DIAGNOSIS — I63512 Cerebral infarction due to unspecified occlusion or stenosis of left middle cerebral artery: Secondary | ICD-10-CM | POA: Diagnosis not present

## 2023-04-18 MED ORDER — LIDOCAINE 5 % EX PTCH
2.0000 | MEDICATED_PATCH | Freq: Every day | CUTANEOUS | Status: DC
Start: 2023-04-18 — End: 2023-04-21
  Administered 2023-04-18 – 2023-04-20 (×3): 2 via TRANSDERMAL
  Filled 2023-04-18 (×3): qty 2

## 2023-04-18 NOTE — Progress Notes (Signed)
Occupational Therapy Session Note  Patient Details  Name: [name] MRN: [id number] Date of Birth: 10-Jan-1941  Today's Date: 04/18/2023 OT Individual Time: 0100-0145 OT Individual Time Calculation (min): 45 min    Short Term Goals: Week 1:  OT Short Term Goal 1 (Week 1): STG=LTG based on LOS  Skilled Therapeutic Interventions/Progress Updates:    Patient seen for OT treatment, pt long sitting in bed with family present.  The pt was able to  come from in bed to EOB with MinA. The pt was able to sit EOB unsupported incorporating the bed rails on occasion.  The pt was transferred from EOB to w/c LOF using the RW for additional balance for stand pivot transfer.  The pt was transported to the rest room and was able to doff her LB garments with MinA inclusive of a brief and pants using the grab bar for additional balance.  The pt was able to complete hygiene of her perineal and bottom with s/u assisst and she was able to  donn  her clothing items at the same LOF.  The pt was able to transfer back to her w/c with MinA using the grab bars and was transported to the sink area to wash her hands with s/u assist. The pt went on to complete a ball hand off activity to improve bilateral hand function for > Ind.  She completed  a dowel rod activity by grasping a 1lb  dowel with BUE and carrying the dowel into shld flexion 2 sets of 10 requiring frequent rest breaks.  At the end of the session , the pt was transferred from w/c LOF back to bed using the bed rails for additional balance for ambulating to EOB at [facility]. The pt was MinA for managing BLE as well. All additional needs of the pt were addressed prior to exiting the room.   Therapy Documentation Precautions:  Precautions Precautions: Fall Precaution Comments: R hemi (UE>LE) Restrictions Weight Bearing Restrictions: No   Therapy/Group: Individual Therapy  Lavona Mound 04/18/2023, 5:38 PM

## 2023-04-18 NOTE — Progress Notes (Signed)
PROGRESS NOTE   Subjective/Complaints:  Pt reports feels good except 2/10 back pain- she feels it's due to bed.   - asking if her grandsons had been here.  Slightly confused.    ROS:   Limited, but denies SOB, abd pain; N/V/C/D/ Objective:   No results found. No results for input(s): "WBC", "HGB", "HCT", "PLT" in the last 72 hours.  No results for input(s): "NA", "K", "CL", "CO2", "GLUCOSE", "BUN", "CREATININE", "CALCIUM" in the last 72 hours.   Intake/Output Summary (Last 24 hours) at 04/18/2023 1023 Last data filed at 04/18/2023 0833 Gross per 24 hour  Intake 478 ml  Output 15 ml  Net 463 ml        Physical Exam: Vital Signs Blood pressure (!) 123/55, pulse (!) 50, temperature 97.6 F (36.4 C), resp. rate 18, height 5\' 4"  (1.626 m), weight 57.4 kg, SpO2 99%.     General: awake, alert, slightly confused; sitting up in bed;  NAD HENT: conjugate gaze; oropharynx moist CV: bradycardic rate; no JVD Pulmonary: CTA B/L; no W/R/R- good air movement GI: soft, NT, ND, (+)BS Psychiatric: very flat Neurological: delayed responses still- didn't remember when grandsons had been here  Neurologic: Cranial nerves II through XII intact, motor strength is 5/5 in Left  deltoid, bicep, tricep, grip, 5/5 Bilateral hip flexor, knee extensors, ankle dorsiflexor and plantar flexor RUE 1+ grip, 1+ bi, 2/5 tri, 0/5 delt, exam stable 10/30 Sensory exam normal sensation to light touch and proprioception in bilateral upper and lower extremities Cerebellar exam Limited RUE due to weakness Musculoskeletal: Full range of motion in all 4 extremities. No joint swelling. Patient is alert and makes eye contact with examiner.  She follows simple commands.  She can name hospital and family members but not age or time.   speech without dysarthria or aphasia, exam stable 11/1     Assessment/Plan: 1. Functional deficits which require 3+ hours  per day of interdisciplinary therapy in a comprehensive inpatient rehab setting. Physiatrist is providing close team supervision and 24 hour management of active medical problems listed below. Physiatrist and rehab team continue to assess barriers to discharge/monitor patient progress toward functional and medical goals  Care Tool:  Bathing    Body parts bathed by patient: Right arm, Chest, Abdomen, Front perineal area, Face, Buttocks, Right upper leg, Left upper leg   Body parts bathed by helper: Buttocks, Left lower leg, Right lower leg, Left upper leg, Right upper leg, Left arm Body parts n/a: Right lower leg, Left lower leg, Left arm   Bathing assist Assist Level: Moderate Assistance - Patient 50 - 74%     Upper Body Dressing/Undressing Upper body dressing   What is the patient wearing?: Bra, Pull over shirt    Upper body assist Assist Level: Moderate Assistance - Patient 50 - 74%    Lower Body Dressing/Undressing Lower body dressing      What is the patient wearing?: Underwear/pull up, Pants     Lower body assist Assist for lower body dressing: Moderate Assistance - Patient 50 - 74%     Toileting Toileting    Toileting assist Assist for toileting: Minimal Assistance - Patient > 75%  Transfers Chair/bed transfer  Transfers assist     Chair/bed transfer assist level: Minimal Assistance - Patient > 75%     Locomotion Ambulation   Ambulation assist      Assist level: Minimal Assistance - Patient > 75% Assistive device: No Device Max distance: 140   Walk 10 feet activity   Assist     Assist level: Minimal Assistance - Patient > 75% Assistive device: No Device   Walk 50 feet activity   Assist    Assist level: Minimal Assistance - Patient > 75% Assistive device: No Device    Walk 150 feet activity   Assist    Assist level: Minimal Assistance - Patient > 75% Assistive device: No Device    Walk 10 feet on uneven surface   activity   Assist     Assist level: Minimal Assistance - Patient > 75%     Wheelchair     Assist Is the patient using a wheelchair?: No             Wheelchair 50 feet with 2 turns activity    Assist            Wheelchair 150 feet activity     Assist          Blood pressure (!) 123/55, pulse (!) 50, temperature 97.6 F (36.4 C), resp. rate 18, height 5\' 4"  (1.626 m), weight 57.4 kg, SpO2 99%.  Medical Problem List and Plan: 1. 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  Antithrombotics: -DVT/anticoagulation:  Pharmaceutical: Eliquis             -antiplatelet therapy: N/A 3. Low back pain: Prescribed home Zynex  11/3- having low back pain- added lidoderm for back pain 2 patches 4. Mood/Behavior/Sleep: Provide emotional support             -antipsychotic agents: N/A 5. Neuropsych/cognition: This patient is not capable of making decisions on her own behalf.  11/2- pt not appropriate to decide if ready for d/c yet 6. Skin/Wound Care: Routine skin checks 7. Fluids/Electrolytes/Nutrition: Routine in and outs with follow-up chemistries 8.  New onset atrial fibrillation.  Continue Eliquis.  Cardiac rate controlled  11/3- rate in 50s this AM- will monitor closely.  9.  Hypertension.  Clonidine 0.1 mg twice daily, decrease Cozaar to 25mg  given hypotension.  Monitor with increased mobility  11/3- BP running 120s- 150s- monitor trend of BP 10.  Hyperlipidemia.  Lipitor  11.  Dysphagia.  Dysphagia #1, advanced to thins  11/3- still on D1 thins diet 12. Bradycardia: continue to monitor HR TID, decrease Cozaar to 50mg  daily, resolved.   11/2- HR is 50s again this AM- cozaar down to 25 mg daily-    11/3- HR is 50 this AM- no BP meds that effect BP? 13.  Cognitive deficits: continue SLP  14. Hyperglycemia: d/c Ensure and made goal to focus on eating regular meals  15. Transaminitis: d/c Tylenol  16. Vitamin D deficiency: ergocalciferol 50,000U once per week started, continue  17. Right wrist drop: resting hand splint ordered  18. Insomnia: melatonin 3mg  started HS, continue  11/2- slept well 19. Urinary frequency, improved, d/c antibiotics since culture shows  minimal growth.    20. Back pain  11/3- will add lidoderm patches 2 patches nightly   LOS: 6 days A FACE TO FACE EVALUATION WAS PERFORMED  Mamye Bolds 04/18/2023, 10:23 AM

## 2023-04-18 NOTE — Progress Notes (Signed)
Occupational Therapy Session Note  Patient Details  Name: [name] MRN: [id number] Date of Birth: 03-26-1941  {CHL IP REHAB OT TIME CALCULATIONS:[id number]}   Short Term Goals: Week 2:  OT Short Term Goal 1 (Week 2): STG=LTG based on LOS  Skilled Therapeutic Interventions/Progress Updates:    Completed family training with ***. Therapist educated family regarding ***. Education also provided on strategies and compensatory techniques to use if ***. Education provided for gait belt use and handling technique. *** completed hands on training for *** and *** transfer with therapist providing VC as needed for technique and form. All education completed and all questions    Therapy Documentation Precautions:  Precautions Precautions: Fall Precaution Comments: R hemi (UE>LE) Restrictions Weight Bearing Restrictions: No    Therapy/Group: Individual Therapy  Limmie Patricia, OTR/L,CBIS  Supplemental OT - [facility] and [facility] Secure Chat Preferred   04/18/2023, 5:27 PM

## 2023-04-18 NOTE — Progress Notes (Signed)
Occupational Therapy Session Note  Patient Details  Name: [name] MRN: [id number] Date of Birth: Aug 21, 1940  Today's Date: 04/18/2023 OT Individual Time: 4098-1191 OT Individual Time Calculation (min): 41 min    Short Term Goals: Week 2:  OT Short Term Goal 1 (Week 2): STG=LTG based on LOS  Skilled Therapeutic Interventions/Progress Updates:  Pt received sitting in [facility] for skilled OT session with focus on RUE NMR. Pt agreeable to interventions, demonstrating overall pleasant mood. Pt with no reports of pain,OT offering intermediate rest breaks and positioning suggestions throughout session to address pain/fatigue and maximize participation/safety in session.   Pt dependent for WC transport from room<>main therapy gym for time/energy management. In main therapy gym, pt instructed in R NMR task as she grips onto colored hoops, transporting from R-side to midline to place onto large peg. Pt completes x 20 reps, benefiting from tactile cuing for proprioreceptive input and attention to hemi-limb. Pt attempts to use triod grasp to hold onto hoop, requiring HOH to support grip/transport of hoop, heavy cuing provided to avoid over-compensation with LUE.   Pt gently re-oriented to time, place, and situation.    Pt remained sitting in [facility] with all immediate needs met at end of session. Pt continues to be appropriate for skilled OT intervention to promote further functional independence.   Therapy Documentation Precautions:  Precautions Precautions: Fall Precaution Comments: R hemi (UE>LE) Restrictions Weight Bearing Restrictions: No   Therapy/Group: Individual Therapy  Lou Cal, OTR/L, MSOT  04/18/2023, 6:56 AM

## 2023-04-19 DIAGNOSIS — I63512 Cerebral infarction due to unspecified occlusion or stenosis of left middle cerebral artery: Secondary | ICD-10-CM | POA: Diagnosis not present

## 2023-04-19 MED ORDER — ACETAMINOPHEN 325 MG PO TABS: 650.0000 mg | ORAL_TABLET | Freq: Four times a day (QID) | ORAL | Status: AC | PRN

## 2023-04-19 NOTE — Progress Notes (Addendum)
Patient ID: [name], female   DOB: [birth year], 82 y.o.   MRN: [id number]  Have gotten [facility] to accept home health referral for follow up therapies at discharge. Son here today for education in preparation for discharge 11/6.  11:30 am Met with pt and son to see how went with education today. He reports it went well and he is aware of her 24/7 care needs. She has all needed equipment and made aware of the home health follow up. Bedside RN to go over any nursing needs with son before he leaves today.

## 2023-04-19 NOTE — Plan of Care (Signed)
Goals downgraded to minA level due to safety concerns with cognitive deficits with impaired recall and memory   Problem: RH Balance Goal: LTG Patient will maintain dynamic standing balance (PT) Description: LTG:  Patient will maintain dynamic standing balance with assistance during mobility activities (PT) Flowsheets (Taken 04/19/2023 1132) LTG: Pt will maintain dynamic standing balance during mobility activities with:: Minimal Assistance - Patient > 75%   Problem: Sit to Stand Goal: LTG:  Patient will perform sit to stand with assistance level (PT) Description: LTG:  Patient will perform sit to stand with assistance level (PT) Flowsheets (Taken 04/19/2023 1132) LTG: PT will perform sit to stand in preparation for functional mobility with assistance level: Minimal Assistance - Patient > 75%   Problem: RH Bed to Chair Transfers Goal: LTG Patient will perform bed/chair transfers w/assist (PT) Description: LTG: Patient will perform bed to chair transfers with assistance (PT). Flowsheets (Taken 04/19/2023 1132) LTG: Pt will perform Bed to Chair Transfers with assistance level: Minimal Assistance - Patient > 75%   Problem: RH Car Transfers Goal: LTG Patient will perform car transfers with assist (PT) Description: LTG: Patient will perform car transfers with assistance (PT). Flowsheets (Taken 04/19/2023 1132) LTG: Pt will perform car transfers with assist:: Minimal Assistance - Patient > 75%   Problem: RH Ambulation Goal: LTG Patient will ambulate in controlled environment (PT) Description: LTG: Patient will ambulate in a controlled environment, # of feet with assistance (PT). Flowsheets (Taken 04/19/2023 1132) LTG: Pt will ambulate in controlled environ  assist needed:: Minimal Assistance - Patient > 75% LTG: Ambulation distance in controlled environment: 134ft Goal: LTG Patient will ambulate in home environment (PT) Description: LTG: Patient will ambulate in home environment, # of feet with  assistance (PT). Flowsheets (Taken 04/19/2023 1132) LTG: Pt will ambulate in home environ  assist needed:: Minimal Assistance - Patient > 75% LTG: Ambulation distance in home environment: 60ft   Problem: RH Stairs Goal: LTG Patient will ambulate up and down stairs w/assist (PT) Description: LTG: Patient will ambulate up and down # of stairs with assistance (PT) Flowsheets (Taken 04/19/2023 1132) LTG: Pt will ambulate up/down stairs assist needed:: Minimal Assistance - Patient > 75% LTG: Pt will  ambulate up and down number of stairs: 4 with 1 railing

## 2023-04-19 NOTE — Patient Instructions (Addendum)
1) SHOULDER: Flexion On Table   Place RIGHT HAND ONLY on towel placed on table, elbows straight. Lean forward with you upper body, pushing towel away from body.  __5-10_ reps per set  2) Abduction (Passive)   AROM: Finger Flexion / Extension   Actively bend fingers of right hand. Start with knuckles furthest from palm, and slowly make a fist. Hold __3__ seconds. Relax. Then straighten fingers as far as possible. Repeat __3__ times per set. Do __1__ sets per session. Do _2-3___ sessions per day.  Copyright  VHI. All rights reserved.   Paper Crumpling Exercise     Towel Roll Squeeze   With right forearm resting on surface, gently squeeze towel. Repeat __5__ times per set. Do __1__ sets per session. Do _2-3___ sessions per day.  Copyright  VHI. All rights reserved.   Abduction / Adduction (Active)    With hand flat on table, spread all fingers apart, then bring them together as close as possible. Repeat __5__ times. Do _2-3___ sessions per day.  Copyright  VHI. All rights reserved.  AROM: Thumb Abduction / Adduction   Actively pull right thumb away from palm as far as possible. Then bring thumb back to touch fingers. Try not to bend fingers toward thumb. Repeat __5__ times per set. Do __1__ sets per session. Do ___2-3_ sessions per day.  Copyright  VHI. All rights reserved.

## 2023-04-19 NOTE — Progress Notes (Signed)
Physical Therapy Session Note  Patient Details  Name: [name] MRN: [id number] Date of Birth: 19-Jul-1940  Today's Date: 04/19/2023 PT Individual Time: 1045-1130 + 1430-1525 PT Individual Time Calculation (min): 45 min  + 55 min  Short Term Goals: Week 1:  PT Short Term Goal 1 (Week 1): STG = LTG due to ELOS  Skilled Therapeutic Interventions/Progress Updates:      1st session: Pt sitting upright in w/c to start with her son, Fayrene Fearing, present for family education and training. Reviewed PT POC, PT goals, DME rec's (none), follow up therapies, and anticipated level of care at home. Fayrene Fearing voiced understanding of all.   Sit<>stand with CGA/minA and no AD. Ambulated with no AD and minA overall ~175ft to main rehab gym. Pt with narrow BOS and trunk lean R while ambulating. Reviewed guarding and using the gait belt for fall prevention. Pt completed stair training using the 3" steps as Fayrene Fearing reports this is their house setup. She navigated 3x8 stairs with minA and 1 hand rail. Continues to have narrow BOS, trunk lean R, and inattention to Dola Argyle provided hands on assist and does well with cueing and managing patient.   James ambulated with patient ~114ft to ortho rehab gym with appropriate guarding and cues. He assisted her with car transfer with car height simulating their mid-size SUV - cues for safety approach and sitting before getting her legs in/out of the vehicle.   Completed BITS to work on [employer] for R attention and R visual scanning. Visual tracking completed with user paced reaching (58%, 2 minutes, 11 sec reaction time as well as the Bell Cancellation test (max verbal cues for locating, missing all on her R side, disorganized sequencing and scanning). Tests completed at wheelchair level to reduce dual-cog component.   James assisted with ambulating her back to her room, ~212ft, with minA and PT providing supervision for safety. Ended session with patient sitting up in recliner, 1/2 lap  tray supporting RUE. No further questions or concerns from the son, Fayrene Fearing.    2nd session: Pt in bed - requesting to speak with her grandson - called via house phone to help with reorientation and calming. Pt with no signs of pain. She remains quite confused, repeating herself and questions throughout session - no signs of carryover of recall of STM.   Bed mobility completed with supervision assist. Sit<>stand with CGA/minA and ambulates from her room to [facility] elevators with minA and noAD - her arm wrapped around PT waist for stability during ambulation. Continues to have primary gait deficits of decreased stride length, narrow BOS, and R trunk lean.   Practiced unlevel gait training outdoors on paved surfaces - up/down sloped terrain and around cracked pavement - minA overall without the use of an AD. Seated rest breaks as needed on park benches with safety cues for approach and safe sitting.   Pt complained of feeling like something was "hung" and "caught" in her throat throughout session. SLP notified via secure chat who is aware of this sensation and did family education/training this AM with her son.   Returned upstairs to CIR floor, ambulating >465ft with CGA to start but fading to heavier minA when fatigued after ~272ft with more flexed trunk and worsening R trunk lean.   Provided her with water (no straw) to try and help relieve the sensation of feeling like food is caught in her throat - no improvement and had x2 8oz cups of water.   Completed Kinetron (modA  for safely stepping onto machine) with resistance set to 40cm/sec - cues for full AROM on her R side - completed 2x4 minute sets. With seated rest breaks between.  Returned to her room and pt reporting need to void (when asked) and assisted to the bathroom where she was continent of bladder void - able to pull pants/undewear down in standing with minA for balance - required minA for pulling them up when she finished toileting.  Returned  to her bed and patient assisted to [facility] for better positioning. HOB Raised >30deg. Alarm on, all needs met.   Therapy Documentation Precautions:  Precautions Precautions: Fall Precaution Comments: R hemi (UE>LE) Restrictions Weight Bearing Restrictions: No General:     Therapy/Group: Individual Therapy  Socorro Ebron P Mackenzie Lia 04/19/2023, 7:50 AM

## 2023-04-19 NOTE — Progress Notes (Signed)
Speech Language Pathology Daily Session Note  Patient Details  Name: [name] MRN: [id number] Date of Birth: [birth year]  Today's Date: 04/19/2023 SLP Individual Time: 1001-1045 SLP Individual Time Calculation (min): 44 min  Short Term Goals: Week 1: SLP Short Term Goal 1 (Week 1): STGs= LTGs due to ELOS  Skilled Therapeutic Interventions: SLP conducted skilled therapy session targeting family education of patient's swallowing and cognitive status as well as functional targeting of cognitive retraining goals. Patient's son, Fayrene Fearing, present for duration of session. Patient continues to be highly forgetful, often asking repetitive questions throughout serssion re: presence of clothes for her to wear and estimated remaining length of stay. SLP provided information verbally and via handout re: Dysphagia 1 diet with thin liquid, spaced retrieval training, environmental modifications to assist with cognition, and esophageal precautions as patient reported globus sensation at the base of neck this session and previous despite no solids consumed throughout session. Frequent eructations observed throughout session, recommend patient see GI on an outpatient basis to rule out esophageal involvement in discomfort. Son verbalized understanding to all information provided. At end of session, patient requested assistance to commode where she was continent of bladder. Patient required mod verbal cues for safety as she navigated use of walker to commode and to sink. Patient was left in chair with call bell in reach and chair alarm set. SLP will continue to target goals per plan of care.       Pain Pain Assessment Pain Scale: 0-10 Pain Score: 0-No pain  Therapy/Group: Individual Therapy  Jeannie Done, M.A., CCC-SLP  Yetta Barre 04/19/2023, 12:44 PM

## 2023-04-19 NOTE — Progress Notes (Signed)
PROGRESS NOTE   Subjective/Complaints: C/o applesauce feeling stuck in her throat, encouraged drinking water and she felt better afterward Son present for caregiver training and has no new concerns   ROS:   Limited, but denies SOB, abd pain; N/V/C/D, +dysphagia Objective:   No results found. No results for input(s): "WBC", "HGB", "HCT", "PLT" in the last 72 hours.  No results for input(s): "NA", "K", "CL", "CO2", "GLUCOSE", "BUN", "CREATININE", "CALCIUM" in the last 72 hours.   Intake/Output Summary (Last 24 hours) at 04/19/2023 1006 Last data filed at 04/19/2023 0730 Gross per 24 hour  Intake 1000 ml  Output --  Net 1000 ml        Physical Exam: Vital Signs Blood pressure (!) 126/45, pulse (!) 53, temperature 97.6 F (36.4 C), temperature source Oral, resp. rate 18, height 5\' 4"  (1.626 m), weight 57.4 kg, SpO2 100%.     General: awake, alert, slightly confused; sitting up in bed;  NAD HENT: conjugate gaze; oropharynx moist CV: bradycardic rate; no JVD Pulmonary: CTA B/L; no W/R/R- good air movement GI: soft, NT, ND, (+)BS Psychiatric: very flat Neurological: delayed responses still- didn't remember when grandsons had been here  Neurologic: Cranial nerves II through XII intact, motor strength is 5/5 in Left  deltoid, bicep, tricep, grip, 5/5 Bilateral hip flexor, knee extensors, ankle dorsiflexor and plantar flexor RUE 1+ grip, 1+ bi, 2/5 tri, 0/5 delt, exam stable 10/30 Sensory exam normal sensation to light touch and proprioception in bilateral upper and lower extremities Cerebellar exam Limited RUE due to weakness Musculoskeletal: Full range of motion in all 4 extremities. No joint swelling. Patient is alert and makes eye contact with examiner.  She follows simple commands.  She can name hospital and family members but not age or time.   speech without dysarthria or aphasia, exam stable 11/4      Assessment/Plan: 1. Functional deficits which require 3+ hours per day of interdisciplinary therapy in a comprehensive inpatient rehab setting. Physiatrist is providing close team supervision and 24 hour management of active medical problems listed below. Physiatrist and rehab team continue to assess barriers to discharge/monitor patient progress toward functional and medical goals  Care Tool:  Bathing    Body parts bathed by patient: Right arm, Chest, Abdomen, Front perineal area, Face, Buttocks, Right upper leg, Left upper leg   Body parts bathed by helper: Buttocks, Left lower leg, Right lower leg, Left upper leg, Right upper leg, Left arm Body parts n/a: Right lower leg, Left lower leg, Left arm   Bathing assist Assist Level: Moderate Assistance - Patient 50 - 74%     Upper Body Dressing/Undressing Upper body dressing   What is the patient wearing?: Bra, Pull over shirt    Upper body assist Assist Level: Moderate Assistance - Patient 50 - 74%    Lower Body Dressing/Undressing Lower body dressing      What is the patient wearing?: Underwear/pull up, Pants     Lower body assist Assist for lower body dressing: Moderate Assistance - Patient 50 - 74%     Toileting Toileting    Toileting assist Assist for toileting: Minimal Assistance - Patient > 75%  Transfers Chair/bed transfer  Transfers assist     Chair/bed transfer assist level: Minimal Assistance - Patient > 75%     Locomotion Ambulation   Ambulation assist      Assist level: Minimal Assistance - Patient > 75% Assistive device: No Device Max distance: 140   Walk 10 feet activity   Assist     Assist level: Minimal Assistance - Patient > 75% Assistive device: No Device   Walk 50 feet activity   Assist    Assist level: Minimal Assistance - Patient > 75% Assistive device: No Device    Walk 150 feet activity   Assist    Assist level: Minimal Assistance - Patient >  75% Assistive device: No Device    Walk 10 feet on uneven surface  activity   Assist     Assist level: Minimal Assistance - Patient > 75%     Wheelchair     Assist Is the patient using a wheelchair?: No             Wheelchair 50 feet with 2 turns activity    Assist            Wheelchair 150 feet activity     Assist          Blood pressure (!) 126/45, pulse (!) 53, temperature 97.6 F (36.4 C), temperature source Oral, resp. rate 18, height 5\' 4"  (1.626 m), weight 57.4 kg, SpO2 100%.  Medical Problem List and Plan: 1. 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  Antithrombotics: -DVT/anticoagulation:  Pharmaceutical: Eliquis             -antiplatelet therapy: N/A 3. Low back pain: Prescribed home Zynex  11/3- having low back pain- added lidoderm for back pain 2 patches 4. Mood/Behavior/Sleep: Provide emotional support             -antipsychotic agents: N/A 5. Neuropsych/cognition: This patient is not capable of making decisions on her own behalf.  11/2- pt not appropriate to decide if ready for d/c yet 6. Skin/Wound Care: Routine skin checks 7. Fluids/Electrolytes/Nutrition: Routine in and outs with follow-up chemistries 8.  New onset atrial fibrillation.  Continue Eliquis.  Cardiac rate controlled  11/3- rate in 50s this AM- will monitor closely.  9.  Hypertension.  Clonidine 0.1 mg twice daily, decrease Cozaar to 25mg  given hypotension.  Monitor with increased mobility  11/3- BP running 120s- 150s- monitor trend of BP 10.  Hyperlipidemia.  Lipitor  11.  Dysphagia.  Dysphagia #1, advanced to thins  11/3- still on D1 thins diet 12. Bradycardia: continue to monitor HR TID, decrease Cozaar to 50mg  daily, resolved.   11/2- HR is 50s again  this AM- cozaar down to 25 mg daily-    11/3- HR is 50 this AM- no BP meds that effect BP? 13. Cognitive deficits: continue SLP  14. Hyperglycemia: d/c Ensure and made goal to focus on eating regular meals  15. Transaminitis: d/c Tylenol  16. Vitamin D deficiency: ergocalciferol 50,000U once per week started, continue  17. Right wrist drop: resting hand splint ordered  18. Insomnia: melatonin 3mg  started HS, continue  19. Urinary frequency, improved, d/c antibiotics since culture shows  minimal growth.    20. Back pain: continue lidocaine patch  21. Bradycardia: d/c clonidine   22. Hypotension: d/c clonidine   LOS: 7 days A FACE TO FACE EVALUATION WAS PERFORMED  Minela [place] P Pansey Pinheiro 04/19/2023, 10:06 AM

## 2023-04-20 ENCOUNTER — Telehealth (HOSPITAL_COMMUNITY): Payer: Self-pay | Admitting: Pharmacy Technician

## 2023-04-20 ENCOUNTER — Other Ambulatory Visit (HOSPITAL_COMMUNITY): Payer: Self-pay

## 2023-04-20 DIAGNOSIS — I63512 Cerebral infarction due to unspecified occlusion or stenosis of left middle cerebral artery: Secondary | ICD-10-CM | POA: Diagnosis not present

## 2023-04-20 LAB — CBC
HCT: 37.6 % (ref 36.0–46.0)
Hemoglobin: 13.3 g/dL (ref 12.0–15.0)
MCH: 29.2 pg (ref 26.0–34.0)
MCHC: 35.4 g/dL (ref 30.0–36.0)
MCV: 82.5 fL (ref 80.0–100.0)
Platelets: 299 10*3/uL (ref 150–400)
RBC: 4.56 MIL/uL (ref 3.87–5.11)
RDW: 12.7 % (ref 11.5–15.5)
WBC: 8.2 10*3/uL (ref 4.0–10.5)
nRBC: 0 % (ref 0.0–0.2)

## 2023-04-20 LAB — URINALYSIS, ROUTINE W REFLEX MICROSCOPIC
Bacteria, UA: NONE SEEN
Bilirubin Urine: NEGATIVE
Glucose, UA: NEGATIVE mg/dL
Ketones, ur: NEGATIVE mg/dL
Nitrite: NEGATIVE
Protein, ur: NEGATIVE mg/dL
Specific Gravity, Urine: 1.003 — ABNORMAL LOW (ref 1.005–1.030)
pH: 7 (ref 5.0–8.0)

## 2023-04-20 LAB — BASIC METABOLIC PANEL
Anion gap: 13 (ref 5–15)
BUN: 7 mg/dL — ABNORMAL LOW (ref 8–23)
CO2: 24 mmol/L (ref 22–32)
Calcium: 9.8 mg/dL (ref 8.9–10.3)
Chloride: 91 mmol/L — ABNORMAL LOW (ref 98–111)
Creatinine, Ser: 0.66 mg/dL (ref 0.44–1.00)
GFR, Estimated: >60 mL/min (ref >60)
Glucose, Bld: 134 mg/dL — ABNORMAL HIGH (ref 70–99)
Potassium: 4 mmol/L (ref 3.5–5.1)
Sodium: 128 mmol/L — ABNORMAL LOW (ref 135–145)

## 2023-04-20 LAB — MAGNESIUM: Magnesium: 1.8 mg/dL (ref 1.7–2.4)

## 2023-04-20 MED ORDER — CEPHALEXIN 250 MG PO CAPS
500.0000 mg | ORAL_CAPSULE | Freq: Two times a day (BID) | ORAL | Status: DC
  Administered 2023-04-20 – 2023-04-21 (×2): 500 mg via ORAL
  Filled 2023-04-20 (×2): qty 2

## 2023-04-20 MED ORDER — APIXABAN 5 MG PO TABS
5.0000 mg | ORAL_TABLET | Freq: Two times a day (BID) | ORAL | 0 refills | Status: DC
  Filled 2023-04-20: qty 60, 30d supply, fill #0

## 2023-04-20 MED ORDER — CLONIDINE HCL 0.1 MG PO TABS
0.1000 mg | ORAL_TABLET | Freq: Two times a day (BID) | ORAL | 0 refills | Status: DC
  Filled 2023-04-20: qty 60, 30d supply, fill #0

## 2023-04-20 MED ORDER — ATORVASTATIN CALCIUM 80 MG PO TABS
80.0000 mg | ORAL_TABLET | Freq: Every day | ORAL | 0 refills | Status: DC
  Filled 2023-04-20: qty 30, 30d supply, fill #0

## 2023-04-20 MED ORDER — LIDOCAINE 5 % EX PTCH
2.0000 | MEDICATED_PATCH | Freq: Every day | CUTANEOUS | 0 refills | Status: DC
  Filled 2023-04-20: qty 60, 30d supply, fill #0

## 2023-04-20 MED ORDER — L-METHYLFOLATE-B6-B12 3-35-2 MG PO TABS
1.0000 | ORAL_TABLET | Freq: Every day | ORAL | 0 refills | Status: DC
  Filled 2023-04-20: qty 30, 30d supply, fill #0

## 2023-04-20 MED ORDER — LOSARTAN POTASSIUM 50 MG PO TABS
50.0000 mg | ORAL_TABLET | Freq: Every day | ORAL | Status: DC
  Administered 2023-04-21: 50 mg via ORAL
  Filled 2023-04-20: qty 1

## 2023-04-20 MED ORDER — CHOLECALCIFEROL 125 MCG (5000 UT) PO TABS
5000.0000 [IU] | ORAL_TABLET | Freq: Every day | ORAL | 0 refills | Status: DC
  Filled 2023-04-20: qty 30, 30d supply, fill #0

## 2023-04-20 MED ORDER — CLONIDINE HCL 0.1 MG PO TABS
0.1000 mg | ORAL_TABLET | Freq: Two times a day (BID) | ORAL | Status: DC
  Administered 2023-04-20: 0.1 mg via ORAL
  Filled 2023-04-20 (×2): qty 1

## 2023-04-20 MED ORDER — MELATONIN 3 MG PO TABS
3.0000 mg | ORAL_TABLET | Freq: Every day | ORAL | 0 refills | Status: AC
  Filled 2023-04-20: qty 30, 30d supply, fill #0

## 2023-04-20 MED ORDER — LOSARTAN POTASSIUM 25 MG PO TABS
25.0000 mg | ORAL_TABLET | Freq: Every day | ORAL | 0 refills | Status: DC
  Filled 2023-04-20: qty 30, 30d supply, fill #0

## 2023-04-20 NOTE — Progress Notes (Signed)
PROGRESS NOTE   Subjective/Complaints: No new complaints this morning Discussed d/c tomorrow and she is looking forward to this Lidocaine patches approved  ROS:   Limited, but denies SOB, abd pain; N/V/C/D, +dysphagia Objective:   No results found. No results for input(s): "WBC", "HGB", "HCT", "PLT" in the last 72 hours.  No results for input(s): "NA", "K", "CL", "CO2", "GLUCOSE", "BUN", "CREATININE", "CALCIUM" in the last 72 hours.   Intake/Output Summary (Last 24 hours) at 04/20/2023 0941 Last data filed at 04/20/2023 0815 Gross per 24 hour  Intake 120 ml  Output --  Net 120 ml        Physical Exam: Vital Signs Blood pressure (!) 155/68, pulse 64, temperature 98.2 F (36.8 C), temperature source Oral, resp. rate 18, height 5\' 4"  (1.626 m), weight 57.4 kg, SpO2 100%.     General: awake, alert, slightly confused; sitting up in bed;  NAD HENT: conjugate gaze; oropharynx moist CV: bradycardic rate; no JVD Pulmonary: CTA B/L; no W/R/R- good air movement GI: soft, NT, ND, (+)BS Psychiatric: very flat Neurological: delayed responses still- didn't remember when grandsons had been here  Neurologic: Cranial nerves II through XII intact, motor strength is 5/5 in Left  deltoid, bicep, tricep, grip, 5/5 Bilateral hip flexor, knee extensors, ankle dorsiflexor and plantar flexor RUE 1+ grip, 1+ bi, 2/5 tri, 0/5 delt, exam stable 10/30 Sensory exam normal sensation to light touch and proprioception in bilateral upper and lower extremities Cerebellar exam Limited RUE due to weakness Musculoskeletal: Full range of motion in all 4 extremities. No joint swelling. Patient is alert and makes eye contact with examiner.  She follows simple commands.  She can name hospital and family members but not age or time.   speech without dysarthria or aphasia, exam stable 11/5     Assessment/Plan: 1. Functional deficits which require 3+  hours per day of interdisciplinary therapy in a comprehensive inpatient rehab setting. Physiatrist is providing close team supervision and 24 hour management of active medical problems listed below. Physiatrist and rehab team continue to assess barriers to discharge/monitor patient progress toward functional and medical goals  Care Tool:  Bathing    Body parts bathed by patient: Right arm, Chest, Abdomen, Front perineal area, Face, Buttocks, Right upper leg, Left upper leg   Body parts bathed by helper: Buttocks, Left lower leg, Right lower leg, Left upper leg, Right upper leg, Left arm Body parts n/a: Right lower leg, Left lower leg, Left arm   Bathing assist Assist Level: Moderate Assistance - Patient 50 - 74%     Upper Body Dressing/Undressing Upper body dressing   What is the patient wearing?: Bra, Pull over shirt    Upper body assist Assist Level: Moderate Assistance - Patient 50 - 74%    Lower Body Dressing/Undressing Lower body dressing      What is the patient wearing?: Underwear/pull up, Pants     Lower body assist Assist for lower body dressing: Moderate Assistance - Patient 50 - 74%     Toileting Toileting    Toileting assist Assist for toileting: Minimal Assistance - Patient > 75%     Transfers Chair/bed transfer  Transfers assist  Chair/bed transfer assist level: Minimal Assistance - Patient > 75%     Locomotion Ambulation   Ambulation assist      Assist level: Minimal Assistance - Patient > 75% Assistive device: Hand held assist Max distance: 212ft   Walk 10 feet activity   Assist     Assist level: Minimal Assistance - Patient > 75% Assistive device: Hand held assist   Walk 50 feet activity   Assist    Assist level: Minimal Assistance - Patient > 75% Assistive device: Hand held assist    Walk 150 feet activity   Assist    Assist level: Minimal Assistance - Patient > 75% Assistive device: Hand held assist    Walk 10  feet on uneven surface  activity   Assist     Assist level: Minimal Assistance - Patient > 75% Assistive device: Hand held assist   Wheelchair     Assist Is the patient using a wheelchair?: No             Wheelchair 50 feet with 2 turns activity    Assist            Wheelchair 150 feet activity     Assist          Blood pressure (!) 155/68, pulse 64, temperature 98.2 F (36.8 C), temperature source Oral, resp. rate 18, height 5\' 4"  (1.626 m), weight 57.4 kg, SpO2 100%.  Medical Problem List and Plan: 1. 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  Antithrombotics: -DVT/anticoagulation:  Pharmaceutical: Eliquis             -antiplatelet therapy: N/A 3. Low back pain: Prescribed home Zynex  11/3- having low back pain- added lidoderm for back pain 2 patches 4. Mood/Behavior/Sleep: Provide emotional support             -antipsychotic agents: N/A 5. Neuropsych/cognition: This patient is not capable of making decisions on her own behalf.  11/2- pt not appropriate to decide if ready for d/c yet 6. Skin/Wound Care: Routine skin checks 7. Fluids/Electrolytes/Nutrition: Routine in and outs with follow-up chemistries 8.  New onset atrial fibrillation.  Continue Eliquis.  Cardiac rate controlled  11/3- rate in 50s this AM- will monitor closely.  9.  Hypertension.  Clonidine 0.1 mg twice daily, decrease Cozaar to 25mg  given hypotension.  Monitor with increased mobility  11/3- BP running 120s- 150s- monitor trend of BP 10.  Hyperlipidemia.  Lipitor  11.  Dysphagia.  Dysphagia #1, advanced to thins  11/3- still on D1 thins diet 12. Bradycardia: continue to monitor HR TID, decrease Cozaar to 50mg  daily, resolved.   11/2- HR is 50s again this AM- cozaar  down to 25 mg daily-    11/3- HR is 50 this AM- no BP meds that effect BP? 13. Cognitive deficits: continue SLP  14. Hyperglycemia: d/c Ensure and made goal to focus on eating regular meals  15. Transaminitis: d/c Tylenol  16. Vitamin D deficiency: ergocalciferol 50,000U once per week started, continue  17. Right wrist drop: resting hand splint ordered  18. Insomnia: melatonin 3mg  started HS, continue  19. Urinary frequency, improved, d/c antibiotics since culture shows minimal growth.  20. Back pain: continue lidocaine patch  21. Bradycardia: resolved, restart clonidine 0.1mg  BID, d/c losartan  22. HTN: restart clonidine 0.1mg  BID   LOS: 8 days A FACE TO FACE EVALUATION WAS PERFORMED  Drema Pry Janea Schwenn 04/20/2023, 9:41 AM

## 2023-04-20 NOTE — Progress Notes (Signed)
Physical Therapy Session Note  Patient Details  Name: [name] MRN: [id number] Date of Birth: 06/21/1940  Today's Date: 04/20/2023 PT Individual Time: 4098-1191 + 1445-1525 PT Individual Time Calculation (min): 54 min  + 40 min  Short Term Goals: Week 1:  PT Short Term Goal 1 (Week 1): STG = LTG due to ELOS  Skilled Therapeutic Interventions/Progress Updates:      1st session: Pt in bed, trying to use the hospital call bell to call her grandson but patient unable to recall his phone #. Used house phone to call her grandson to help with reorientation and calming. Pt continues to be quite confused, repeating questions and statements throughout treatment. No recall despite constant reorientation.  Pt reports no pain. Bed mobility completed with supervision. Sit<>Stand with no AD and CGA/minA for stability/balance. Ambulated with no UE support ~154ft with CGA/minA into day room rehab gym. Spent majority of session working on R visual scanning and R attention. Pt requires mod/max verbal cues for attending to the R side. Completed card matching with deck of cards - patient struggles with visual acuity and matching cards despite being able to "read" them - more difficulty with face cards > # cards.   Pt returned to her room and patient reporting need to void. Assisted into the bathroom - minA for lowering/raises underwear/pants in standing. She also requires assist for tearing the TP from wall due to RUE weakness. Continent of bladder void and this was charted.  Returned to her bed and she was left with all needs met and alarm on.    2nd session: Per nursing, patient sitting at the nurses station 2/2 confusion and trying to get out of bed without assistance. Patient positioned back at nurses station at end of session and was made aware of her status.   Patient transported to main rehab gym at wheelchair level. Patient hyperverbose and quite tangential throughout treatment, very much  confused and perseverating on finding her car and car keys. Patient reoriented throughout treatment but no carryover.   Gait training from 38M rehab gym to 4N tower with minA and no AD, ~442ft,  continues to have a R trunk lean with narrow BOS, inattentive to the R side. In 4N tower, used the large windows to help with reorientation questions and question cues to help her problem solve the situation and time as she's oriented to self only.   Returned to 38M rehab gym in similar manner and after seated rest break, completed stair training. She navigated up/down x8, 6" steps with 2 hand rails at minA level - safety cues required for awareness and ensuring her R foot was fully on the step before proceeding.   Patient returned to the 4W nurses station, left with all needs met and seat belt alarm on.   Therapy Documentation Precautions:  Precautions Precautions: Fall Precaution Comments: R hemi (UE>LE) Restrictions Weight Bearing Restrictions: No General:      Therapy/Group: Individual Therapy  Orrin Brigham 04/20/2023, 7:44 AM

## 2023-04-20 NOTE — Progress Notes (Signed)
Speech Language Pathology Discharge Summary  Patient Details  Name: [name] MRN: [id number] Date of Birth: Nov 12, 1940  Date of Discharge from SLP service:April 20, 2023  Today's Date: 04/20/2023 SLP Individual Time: 0900-1000 SLP Individual Time Calculation (min): 60 min   Skilled Therapeutic Interventions: Pt was seen in am to address cognitive re- training. Pt was alert and seated upright in WC upon SLP arrival. She was agreeable for session. SLP challenging pt in functional recall with focus on orientation and biographical information. Utilizing spaced retrieval techniques, pt recalled month with external visual aid present for up to 40 minutes. She was oriented to place and situation throughout session. SLP also challenged pt to recall discharge date with pt able to recall information for up to 10 minutes. Pt recalled her birth month and date but continued to have difficulty recalling year and her age despite max cues. Pt assisted to bathroom during session where she benefited from mod cues for safety due to frequent attempts to abandon walker. SLP also addressed swallowing through presentations of trials of Dys 2 and 3 consistencies. Pt presented with adequate bolus formation of D2 trials with min to no lingual residue. She consumed D3 trials with mildly prolonged mastication however verbalized, "I feel like it might choke me." Further trials were discontinued. At conclusion of session, pt was left seated upright in [facility] with call button within reach and chair alarm active. SLP to continue POC.     Patient has met 7 of 7 long term goals.  Patient to discharge at overall Mod;Max level.  Reasons goals not met:     Clinical Impression/Discharge Summary: Pt has made gains and has met 7 of 7 LTG's this admission due to improved speech intelligibility, attention, and recall. Pt is currently and overall mod to max A for cogntiive tasks and requires min A cues for utilization of swallowing  compensatory strategeis to minimize overt s/s of aspiration with Dys 1 and thin liquids. Pt/ family education complete and pt will discharge home with 24 hour supervision from family. Pt would benfit from home health f/u SLP services to maximize swallowing, cognition, and speech intelligibility in order to maximize her functional independence.  Care Partner:  Caregiver Able to Provide Assistance: Yes  Type of Caregiver Assistance: Cognitive  Recommendation:  24 hour supervision/assistance;Outpatient SLP;Home Health SLP  Rationale for SLP Follow Up: Maximize functional communication;Maximize cognitive function and independence;Maximize swallowing safety   Equipment: none   Reasons for discharge: Discharged from hospital;Treatment goals met   Patient/Family Agrees with Progress Made and Goals Achieved: Yes    Renaee Munda 04/20/2023, 4:49 PM

## 2023-04-20 NOTE — Progress Notes (Signed)
Occupational Therapy Discharge Summary  Patient Details  Name: [name] MRN: [id number] Date of Birth: 07-02-1940  Date of Discharge from OT service:April 20, 2023  Today's Date: 04/20/2023 OT Individual Time: 1914-7829 OT Individual Time Calculation (min): 52 min    Patient has met 1 of 9 long term goals due to improved activity tolerance and postural control.  Patient to discharge at [facility] Assist level.  Patient's care partner is independent to provide the necessary physical and cognitive assistance at discharge.    Reasons goals not met: Pt continues to require overall Min A for BADL participation due to cognitive deficits including decreased memory and safety awareness. Additionally, pt is greatly impacted by RUE inattention/hemiplegia. Family education has been completed, patient's children able to provided necessary level of physical and cognitive assistance.   Recommendation:  Patient will benefit from ongoing skilled OT services in home health setting to continue to advance functional skills in the area of BADL and Reduce care partner burden.  Equipment: BSC & TTB  Reasons for discharge: Discharge from hospital  Patient/family agrees with progress made and goals achieved: Yes  OT Discharge Precautions/Restrictions  Precautions Precautions: Fall Precaution Comments: R hemi (UE>LE);cognitive impairements, including decreased memory and safety awareness. R inattention. Restrictions Weight Bearing Restrictions: No ADL ADL Equipment Provided: Other (comment) (built up foam for utensil) Eating: Moderate assistance Where Assessed-Eating: Wheelchair Grooming: Setup Where Assessed-Grooming: Sitting at sink Upper Body Bathing: Minimal assistance Where Assessed-Upper Body Bathing: Edge of bed, Other (Comment) Lower Body Bathing: Minimal assistance Where Assessed-Lower Body Bathing: Edge of bed Upper Body Dressing: Minimal assistance Where Assessed-Upper Body  Dressing: Edge of bed Lower Body Dressing: Minimal assistance Where Assessed-Lower Body Dressing: Edge of bed Toileting: Contact guard Where Assessed-Toileting: Teacher, adult education: Furniture conservator/restorer Method: Proofreader: Engineer, technical sales: Moderate assistance Tub/Shower Transfer Method: Stand pivot Tub/Shower Equipment: Insurance underwriter: Insurance underwriter Method: Paramedic ADL Comments: confused but super sweet, very focussed on grand children and role, UTI +, min A sit to stand and toilet transfer with grab bar, R hemi dom side, thick liq/D1, blurred vision and R inattention, mod A sink sponge UB, max A LB sponge, max A UB/LB dressing except TEDS dep Vision Baseline Vision/History: 1 Wears glasses Patient Visual Report: Blurring of vision;Peripheral vision impairment Vision Assessment?: Vision impaired- to be further tested in functional context;Yes Eye Alignment: Within Functional Limits Ocular Range of Motion: Restricted looking up;Restricted on the right Alignment/Gaze Preference: Within Defined Limits Tracking/Visual Pursuits: Decreased smoothness of horizontal tracking;Decreased smoothness of vertical tracking;Requires cues, head turns, or add eye shifts to track Saccades: Additional head turns occurred during testing;Additional eye shifts occurred during testing;Decreased speed of saccadic movement Convergence: Impaired (comment) Visual Fields: Right visual field deficit Perception  Perception: Impaired Praxis Praxis: Impaired Praxis Impairment Details: Initiation;Motor planning;Organization Cognition Cognition Overall Cognitive Status: History of cognitive impairments - at baseline Arousal/Alertness: Awake/alert Orientation Level: Place;Person;Situation Memory: Impaired Memory Impairment: Decreased recall of new  information;Storage deficit;Retrieval deficit;Decreased short term memory Decreased Short Term Memory: Verbal basic;Functional basic Attention: Focused;Sustained;Selective Focused Attention: Appears intact Sustained Attention: Appears intact Selective Attention: Impaired Selective Attention Impairment: Verbal basic;Functional basic Awareness: Impaired Awareness Impairment: Intellectual impairment Problem Solving: Impaired Problem Solving Impairment: Verbal basic;Functional basic Executive Function: Reasoning;Sequencing;Self Monitoring;Self Correcting Reasoning: Impaired Reasoning Impairment: Verbal basic;Functional basic Sequencing: Impaired Sequencing Impairment: Verbal basic;Functional basic Self Monitoring: Impaired Self Monitoring Impairment: Verbal basic;Functional basic  Self Correcting: Impaired Self Correcting Impairment: Verbal basic;Functional basic Behaviors: Perseveration Safety/Judgment: Impaired Comments: Decreased insight into limitiations and deficits. Brief Interview for Mental Status (BIMS) Repetition of Three Words (First Attempt): 2 Temporal Orientation: Year: Missed by 2 to 5 years Temporal Orientation: Month: No answer Temporal Orientation: Day: No answer Recall: "Sock": No, could not recall Recall: "Blue": Yes, after cueing ("a color") Recall: "Bed": No, could not recall BIMS Summary Score: 4 Sensation Sensation Light Touch: Appears Intact Hot/Cold: Appears Intact Proprioception: Impaired by gross assessment Stereognosis: Impaired by gross assessment Coordination Gross Motor Movements are Fluid and Coordinated: No Fine Motor Movements are Fluid and Coordinated: No Coordination and Movement Description: 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 Heel Shin Test: Limited on R Motor  Motor Motor: Hemiplegia;Abnormal postural alignment and control;Motor apraxia Motor - Skilled Clinical Observations: UE > LE hemi Mobility  Bed Mobility Bed Mobility: Sit  to Supine;Supine to Sit Supine to Sit: Supervision/Verbal cueing Sit to Supine: Supervision/Verbal cueing Transfers Sit to Stand: Minimal Assistance - Patient > 75% Stand to Sit: Minimal Assistance - Patient > 75%  Trunk/Postural Assessment  Cervical Assessment Cervical Assessment: Within Functional Limits Thoracic Assessment Thoracic Assessment: Within Functional Limits Lumbar Assessment Lumbar Assessment: Within Functional Limits Postural Control Postural Control: Deficits on evaluation Trunk Control: R trunk lean, posterior bias  Balance Balance Balance Assessed: Yes Static Sitting Balance Static Sitting - Balance Support: Feet supported;No upper extremity supported Static Sitting - Level of Assistance: 7: Independent Dynamic Sitting Balance Dynamic Sitting - Balance Support: Feet supported;No upper extremity supported;During functional activity Dynamic Sitting - Level of Assistance: 5: Stand by assistance (CGA-SUP) Dynamic Sitting - Balance Activities: Lateral lean/weight shifting;Forward lean/weight shifting;Reaching for objects Static Standing Balance Static Standing - Balance Support: No upper extremity supported Static Standing - Level of Assistance: 4: Min assist Dynamic Standing Balance Dynamic Standing - Balance Support: No upper extremity supported;During functional activity Dynamic Standing - Level of Assistance: 4: Min assist;3: Mod assist Dynamic Standing - Balance Activities: Lateral lean/weight shifting;Forward lean/weight shifting;Reaching for objects Extremity/Trunk Assessment RUE Assessment RUE Assessment: Exceptions to [facility] Active Range of Motion (AROM) Comments: 0-45 General Strength Comments: 2/5 grossly LUE Assessment LUE Assessment: Within Functional Limits  Skilled Intervention: Pt greeted resting in bed for skilled OT session with focus on BADL participation and discharge planning.   Pain: Pt with no reports of pain, OT offering intermediate rest  breaks and positioning suggestions throughout session to address potential pain/fatigue and maximize participation/safety in session.   Functional Transfers: Pt performs all STS and toilet transfers with CGA (HHA), cuing provided for L-sided attention/awareness. Walk-in shower transfer performed with Min A + heavy multimodal cuing for safe technique   Self Care Tasks: Pt completes full-body dressing and grooming at sink-side with levels of assistance noted above.   Pt remained sitting in WC with 4Ps assessed and immediate needs met. Pt continues to be appropriate for skilled OT intervention to promote further functional independence in ADLs/IADLs.   Lou Cal, OTR/L, MSOT  04/20/2023, 8:46 AM

## 2023-04-20 NOTE — Telephone Encounter (Signed)
Pharmacy Patient Advocate Encounter  Received notification from [facility] that Prior Authorization for Lidocaine 5% patches  has been APPROVED from 04/20/2023 to 06/14/2024   PA #/Case ID/Reference #: [id number]

## 2023-04-20 NOTE — Progress Notes (Signed)
Physical Therapy Discharge Summary  Patient Details  Name: [name] MRN: [id number] Date of Birth: Jul 31, 1940  Date of Discharge from PT service:April 20, 2023  Patient has met 8 of 8 long term goals due to improved activity tolerance, improved balance, improved postural control, increased strength, ability to compensate for deficits, functional use of  right upper extremity and right lower extremity, and improved attention.  Patient to discharge at an ambulatory level Min Assist.   Patient's care partner is independent to provide the necessary physical and cognitive assistance at discharge. Patient's son, Fayrene Fearing, was present for hands on training and education - demonstrated understanding of precautions and ability to manage patient with adequate physical assist (minA).   Reasons goals not met: n/a (goals were downgraded to minA due to safety concerns with her cognition/memory deficits).  Recommendation:  Patient will benefit from ongoing skilled PT services in home health setting to continue to advance safe functional mobility, address ongoing impairments in R sided weakness, R inattention, caregiver training, home safety, functional mobility, and minimize fall risk.  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 Does not apply - I have not had any pain or hurting in the past 5 days Pain Interference with Therapy Activities: 0. Does not apply - I have not received rehabilitationtherapy in the past 5 days Pain Interference with Day-to-Day Activities: 1. Rarely or not at all Vision/Perception  Vision -  History Ability to See in Adequate Light: 3 Highly impaired Vision - Assessment Eye Alignment: Within Functional Limits Ocular Range of Motion: Restricted looking up;Restricted on the right Alignment/Gaze Preference: Within Defined Limits Tracking/Visual Pursuits: Decreased smoothness of horizontal tracking;Decreased smoothness of vertical tracking Saccades: Impaired - to be further tested in functional context Convergence: Impaired (comment) Perception Perception: Impaired Preception Impairment Details: Inattention/Neglect Praxis Praxis: Impaired Praxis Impairment Details: Initiation;Motor planning;Organization  Cognition Overall Cognitive Status: History of cognitive impairments - at baseline Arousal/Alertness: Awake/alert Orientation Level: Oriented to person;Oriented to situation;Disoriented to time;Disoriented to place Attention: Focused;Sustained;Selective Focused Attention: Appears intact Sustained Attention: Appears intact Selective Attention: Impaired Selective Attention Impairment: Verbal basic;Functional basic Memory: Impaired Memory Impairment: Decreased recall of new information;Storage deficit;Retrieval deficit;Decreased short term memory Decreased Short Term Memory: Verbal basic;Functional basic Awareness: Impaired Awareness Impairment: Intellectual impairment Problem Solving: Impaired Problem Solving Impairment: Verbal basic;Functional basic Executive Function: Reasoning;Sequencing;Self Monitoring;Self Correcting Reasoning: Impaired Reasoning Impairment: Verbal basic;Functional basic Sequencing: Impaired Sequencing Impairment: Verbal basic;Functional basic Self Monitoring: Impaired Self Monitoring Impairment: Verbal basic;Functional basic Self Correcting: Impaired Self Correcting Impairment: Verbal basic;Functional basic Behaviors: Perseveration Safety/Judgment: Impaired Comments: Decreased insight into limitiations and deficits Sensation Sensation Light  Touch: Appears Intact Hot/Cold: Appears Intact Proprioception: Impaired by gross assessment Stereognosis: Impaired by gross assessment Coordination Gross Motor Movements are Fluid and Coordinated: No Fine Motor Movements are Fluid and Coordinated: No Coordination and Movement Description: R hemi Heel Shin Test: Limited on R Motor  Motor Motor: Hemiplegia;Abnormal postural alignment and control;Motor apraxia Motor - Skilled Clinical Observations: UE > LE hemi  Mobility Bed Mobility Bed Mobility: Sit to Supine;Supine to Sit Supine to Sit: Supervision/Verbal cueing Sit to Supine: Supervision/Verbal cueing Transfers Transfers: Sit to Stand;Stand to Sit;Stand Pivot Transfers Sit to Stand: Minimal Assistance - Patient > 75% Stand to  Sit: Minimal Assistance - Patient > 75% Stand Pivot Transfers: Minimal Assistance - Patient > 75% Stand Pivot Transfer Details: Tactile cues for initiation;Verbal cues for gait pattern;Visual cues/gestures for sequencing;Verbal cues for sequencing;Verbal cues for technique;Verbal cues for precautions/safety;Manual facilitation for weight shifting;Tactile cues for posture Transfer (Assistive device): 1 person hand held assist Locomotion  Gait Ambulation: Yes Gait Assistance: Minimal Assistance - Patient > 75% Gait Distance (Feet): 200 Feet Assistive device: 1 person hand held assist Gait Assistance Details: Manual facilitation for weight shifting;Verbal cues for precautions/safety;Verbal cues for gait pattern;Verbal cues for technique;Tactile cues for initiation;Tactile cues for posture Gait Gait: Yes Gait Pattern: Impaired Gait Pattern: Step-through pattern;Decreased hip/knee flexion - right;Decreased weight shift to right;Decreased dorsiflexion - right;Poor foot clearance - right;Narrow base of support;Lateral trunk lean to right;Trunk flexed Stairs / Additional Locomotion Stairs: Yes Stairs Assistance: Minimal Assistance - Patient > 75% Stair Management  Technique: One rail Left;Forwards;Step to pattern Number of Stairs: 12 Height of Stairs: 6 Ramp: Minimal Assistance - Patient >75% Curb: Minimal Assistance - Patient >75% Pick up small object from the floor assist level: Minimal Assistance - Patient > 75% Wheelchair Mobility Wheelchair Mobility: No  Trunk/Postural Assessment  Cervical Assessment Cervical Assessment: Within Functional Limits Thoracic Assessment Thoracic Assessment: Within Functional Limits Lumbar Assessment Lumbar Assessment: Within Functional Limits Postural Control Postural Control: Deficits on evaluation Trunk Control: R trunk lean, posterior bias  Balance Balance Balance Assessed: Yes Static Sitting Balance Static Sitting - Balance Support: Feet supported;No upper extremity supported Static Sitting - Level of Assistance: 7: Independent Dynamic Sitting Balance Dynamic Sitting - Balance Support: Feet supported;No upper extremity supported;During functional activity Dynamic Sitting - Level of Assistance: 5: Stand by assistance Static Standing Balance Static Standing - Balance Support: No upper extremity supported Static Standing - Level of Assistance: 4: Min assist Dynamic Standing Balance Dynamic Standing - Balance Support: No upper extremity supported;During functional activity Dynamic Standing - Level of Assistance: 4: Min assist;3: Mod assist Extremity Assessment      RLE Assessment RLE Assessment: Exceptions to [facility] General Strength Comments: Grossly 4/5 LLE Assessment LLE Assessment: Within Functional Limits   Lavaun Greenfield P Aysia Lowder  PT, DPT, CSRS  04/20/2023, 7:52 AM

## 2023-04-20 NOTE — Progress Notes (Addendum)
Inpatient Rehabilitation Discharge Medication Review by a Pharmacist   A complete drug regimen review was completed for this patient to identify any potential clinically significant medication issues.   High Risk Drug Classes Is patient taking? Indication by Medication  Antipsychotic No    Anticoagulant Yes Eliquis - afib  Antibiotic No    Opioid No    Antiplatelet No    Hypoglycemics/insulin No    Vasoactive Medication Yes Losartan - HTN  Chemotherapy No    Other Yes Lipitor - HDL Melatonin - sleep Lidocaine patch - pain Vitamin D - supplementation Metanx - neuropathy        Type of Medication Issue Identified Description of Issue Recommendation(s)  Drug Interaction(s) (clinically significant)        Duplicate Therapy        Allergy        No Medication Administration End Date        Incorrect Dose        Additional Drug Therapy Needed        Significant med changes from prior encounter (inform family/care partners about these prior to discharge).     Other            Clinically significant medication issues were identified that warrant physician communication and completion of prescribed/recommended actions by midnight of the next day:  No    Pharmacist comments: n/a   Time spent performing this drug regimen review (minutes):  20 minutes     Ulyses Southward, PharmD, [place], AAHIVP, CPP Infectious Disease Pharmacist 04/20/2023 9:41 AM

## 2023-04-20 NOTE — Plan of Care (Signed)
  Problem: RH Vision Goal: RH LTG Vision (Specify) Outcome: Completed/Met   Problem: RH Balance Goal: LTG Patient will maintain dynamic standing balance (PT) Description: LTG:  Patient will maintain dynamic standing balance with assistance during mobility activities (PT) Outcome: Completed/Met   Problem: Sit to Stand Goal: LTG:  Patient will perform sit to stand with assistance level (PT) Description: LTG:  Patient will perform sit to stand with assistance level (PT) Outcome: Completed/Met   Problem: RH Bed Mobility Goal: LTG Patient will perform bed mobility with assist (PT) Description: LTG: Patient will perform bed mobility with assistance, with/without cues (PT). Outcome: Completed/Met   Problem: RH Bed to Chair Transfers Goal: LTG Patient will perform bed/chair transfers w/assist (PT) Description: LTG: Patient will perform bed to chair transfers with assistance (PT). Outcome: Completed/Met   Problem: RH Car Transfers Goal: LTG Patient will perform car transfers with assist (PT) Description: LTG: Patient will perform car transfers with assistance (PT). Outcome: Completed/Met   Problem: RH Ambulation Goal: LTG Patient will ambulate in controlled environment (PT) Description: LTG: Patient will ambulate in a controlled environment, # of feet with assistance (PT). Outcome: Completed/Met Goal: LTG Patient will ambulate in home environment (PT) Description: LTG: Patient will ambulate in home environment, # of feet with assistance (PT). Outcome: Completed/Met   Problem: RH Stairs Goal: LTG Patient will ambulate up and down stairs w/assist (PT) Description: LTG: Patient will ambulate up and down # of stairs with assistance (PT) Outcome: Completed/Met

## 2023-04-20 NOTE — Plan of Care (Signed)
  Problem: RH Eating Goal: LTG Patient will perform eating w/assist, cues/equip (OT) Description: LTG: Patient will perform eating with assist, with/without cues using equipment (OT) Outcome: Adequate for Discharge Flowsheets (Taken 04/20/2023 1546) LTG: Pt will perform eating with assistance level of: Independent with assistive device    Problem: RH Grooming Goal: LTG Patient will perform grooming w/assist,cues/equip (OT) Description: LTG: Patient will perform grooming with assist, with/without cues using equipment (OT) Outcome: Completed/Met   Problem: RH Bathing Goal: LTG Patient will bathe all body parts with assist levels (OT) Description: LTG: Patient will bathe all body parts with assist levels (OT) Outcome: Adequate for Discharge Flowsheets (Taken 04/20/2023 1546) LTG: Pt will perform bathing with assistance level/cueing: Minimal Assistance - Patient > 75% Note: Pt continues to require up to Min A for bathing tasks due to decreased RUE inattention and standing balance deficits.    Problem: RH Dressing Goal: LTG Patient will perform upper body dressing (OT) Description: LTG Patient will perform upper body dressing with assist, with/without cues (OT). Outcome: Adequate for Discharge Flowsheets (Taken 04/20/2023 1546) LTG: Pt will perform upper body dressing with assistance level of: Minimal Assistance - Patient > 75% Note: Pt continues to require Min A for UB dressing due to RUE inattention.  Goal: LTG Patient will perform lower body dressing w/assist (OT) Description: LTG: Patient will perform lower body dressing with assist, with/without cues in positioning using equipment (OT) Outcome: Adequate for Discharge Flowsheets (Taken 04/20/2023 1546) LTG: Pt will perform lower body dressing with assistance level of: Minimal Assistance - Patient > 75% Note: Pt continues to require Min A for LB dressing due to RUE inattention and balance deficits.    Problem: RH Toileting Goal: LTG  Patient will perform toileting task (3/3 steps) with assistance level (OT) Description: LTG: Patient will perform toileting task (3/3 steps) with assistance level (OT)  Outcome: Adequate for Discharge Flowsheets (Taken 04/20/2023 1548) LTG: Pt will perform toileting task (3/3 steps) with assistance level: Contact Guard/Touching assist Note: Pt requires CGA for standing balance during toileting activities.    Problem: RH Functional Use of Upper Extremity Goal: LTG Patient will use RT/LT upper extremity as a (OT) Description: LTG: Patient will use right/left upper extremity as a stabilizer/gross assist/diminished/nondominant/dominant level with assist, with/without cues during functional activity (OT) Outcome: Adequate for Discharge Flowsheets (Taken 04/20/2023 1548) LTG: Use of upper extremity in functional activities: RUE as gross assist level LTG: Pt will use upper extremity in functional activity with assistance level of: Supervision/Verbal cueing Note: Pt unable to reach diminished level of functioning due to RUE inattention.    Problem: RH Toilet Transfers Goal: LTG Patient will perform toilet transfers w/assist (OT) Description: LTG: Patient will perform toilet transfers with assist, with/without cues using equipment (OT) Outcome: Adequate for Discharge Flowsheets (Taken 04/20/2023 1548) LTG: Pt will perform toilet transfers with assistance level of: Contact Guard/Touching assist Note: Pt requires CGA-Min A for transfer due to standing balance deficits.    Problem: RH Tub/Shower Transfers Goal: LTG Patient will perform tub/shower transfers w/assist (OT) Description: LTG: Patient will perform tub/shower transfers with assist, with/without cues using equipment (OT) Outcome: Adequate for Discharge Flowsheets (Taken 04/20/2023 1548) LTG: Pt will perform tub/shower stall transfers with assistance level of: Minimal Assistance - Patient > 75% Note: Pt requires Min A for walk-in shower  transfer due to standing balance deficits.

## 2023-04-20 NOTE — Telephone Encounter (Signed)
Pharmacy Patient Advocate Encounter   Received notification that prior authorization for Lidocaine 5% patches is required/requested.   Insurance verification completed.   The patient is insured through [facility] .   Per test claim: PA required; PA submitted to above mentioned insurance via CoverMyMeds Key/confirmation #[id number] [facility] Status is pending

## 2023-04-20 NOTE — Plan of Care (Signed)
  Problem: RH Cognition - SLP Goal: RH LTG Patient will demonstrate orientation with cues Description:  LTG:  Patient will demonstrate orientation to person/place/time/situation with cues (SLP)   Outcome: Completed/Met Flowsheets (Taken 04/20/2023 1644) LTG: Patient will demonstrate orientation using cueing (SLP): Moderate Assistance - Patient 50 - 74%   Problem: RH Expression Communication Goal: LTG Patient will increase speech intelligibility (SLP) Description: LTG: Patient will increase speech intelligibility at word/phrase/conversation level with cues, % of the time (SLP) Outcome: Completed/Met Flowsheets (Taken 04/13/2023 1557) LTG: Patient will increase speech intelligibility (SLP): Minimal Assistance - Patient > 75%   Problem: RH Problem Solving Goal: LTG Patient will demonstrate problem solving for (SLP) Description: LTG:  Patient will demonstrate problem solving for basic/complex daily situations with cues  (SLP) Outcome: Completed/Met Flowsheets Taken 04/20/2023 1644 LTG Patient will demonstrate problem solving for: Moderate Assistance - Patient 50 - 74% Taken 04/13/2023 1557 LTG: Patient will demonstrate problem solving for (SLP): Basic daily situations   Problem: RH Memory Goal: LTG Patient will use memory compensatory aids to (SLP) Description: LTG:  Patient will use memory compensatory aids to recall biographical/new, daily complex information with cues (SLP) Outcome: Completed/Met Flowsheets (Taken 04/20/2023 1644) LTG: Patient will use memory compensatory aids to (SLP): Maximal Assistance - Patient 25 - 49%   Problem: RH Attention Goal: LTG Patient will demonstrate this level of attention during functional activites (SLP) Description: LTG:  Patient will will demonstrate this level of attention during functional activites (SLP) Outcome: Completed/Met Flowsheets Taken 04/20/2023 1644 LTG: Patient will demonstrate this level of attention during cognitive/linguistic  activities with assistance of (SLP): Moderate Assistance - Patient 50 - 74% Taken 04/13/2023 1557 Patient will demonstrate during cognitive/linguistic activities the attention type of: Sustained   Problem: RH Awareness Goal: LTG: Patient will demonstrate awareness during functional activites type of (SLP) Description: LTG: Patient will demonstrate awareness during functional activites type of (SLP) Outcome: Completed/Met Flowsheets (Taken 04/20/2023 1644) Patient will demonstrate during cognitive/linguistic activities awareness type of:  Intellectual  Emergent LTG: Patient will demonstrate awareness during cognitive/linguistic activities with assistance of (SLP): Moderate Assistance - Patient 50 - 74%   Problem: RH Swallowing Goal: LTG Patient will consume least restrictive diet using compensatory strategies with assistance (SLP) Description: LTG:  Patient will consume least restrictive diet using compensatory strategies with assistance (SLP) Outcome: Completed/Met Flowsheets (Taken 04/20/2023 1644) LTG: Pt Patient will consume least restrictive diet using compensatory strategies with assistance of (SLP): Minimal Assistance - Patient > 75%

## 2023-04-20 NOTE — Progress Notes (Signed)
Inpatient Rehabilitation Care Coordinator Discharge Note   Patient Details  Name: [name] MRN: [id number] Date of Birth: May 16, 1941   Discharge location: HOME WITH SON AND GRANDSON WHO WORK DURING THE DAY BUT DAUGHTER [place] WILL BE THERE WITH HER 24/7  Length of Stay: 9 DAYS  Discharge activity level: SUPERVISION LEVEL  Home/community participation: ACTIVE  Patient response GU:RKYHCW Literacy - How often do you need to have someone help you when you read instructions, pamphlets, or other written material from your doctor or pharmacy?: Never  Patient response CB:JSEGBT Isolation - How often do you feel lonely or isolated from those around you?: Never  Services provided included: MD, RD, PT, OT, SLP, RN, CM, TR, Pharmacy, SW  Financial Services:  Field seismologist Utilized: Private Insurance [employer]  Choices offered to/list presented to: PT AND SON  Follow-up services arranged:  Home Health, Patient/Family has no preference for HH/DME agencies Home Health Agency: [facility]   PT  OT  SP  HAS ALL NEEDED EQUIPMENT FROM PAST ADMISSIONS        Patient response to transportation need: Is the patient able to respond to transportation needs?: Yes In the past 12 months, has lack of transportation kept you from medical appointments or from getting medications?: No In the past 12 months, has lack of transportation kept you from meetings, work, or from getting things needed for daily living?: No   Patient/Family verbalized understanding of follow-up arrangements:  Yes  Individual responsible for coordination of the follow-up plan: JAMES-SON [phone]  Confirmed correct DME delivered: Lucy Chris 04/20/2023    Comments (or additional information): SON WAS HERE FOR HANDS ON EDUCATION AND SAW THE CARE MOM REQUIRES. HE REPORTS THEY HAVE 24/7 SUPERVISION IN PLACE AND HOPES HER COGNITION IS BETTER AT HOME IN [facility]IR[facility]MENT  Summary of Stay     Date/Time Discharge Planning CSW  04/14/23 0847 Home with so and grandson who work during the day-son to have ex-wife to be there while he is working RGD       Fadi Menter, Lemar Livings

## 2023-04-21 ENCOUNTER — Other Ambulatory Visit (HOSPITAL_COMMUNITY): Payer: Self-pay

## 2023-04-21 DIAGNOSIS — I63512 Cerebral infarction due to unspecified occlusion or stenosis of left middle cerebral artery: Secondary | ICD-10-CM | POA: Diagnosis not present

## 2023-04-21 LAB — URINE CULTURE: Culture: NO GROWTH

## 2023-04-21 LAB — BASIC METABOLIC PANEL
Anion gap: 9 (ref 5–15)
BUN: 7 mg/dL — ABNORMAL LOW (ref 8–23)
CO2: 24 mmol/L (ref 22–32)
Calcium: 9.6 mg/dL (ref 8.9–10.3)
Chloride: 101 mmol/L (ref 98–111)
Creatinine, Ser: 0.76 mg/dL (ref 0.44–1.00)
GFR, Estimated: >60 mL/min (ref >60)
Glucose, Bld: 106 mg/dL — ABNORMAL HIGH (ref 70–99)
Potassium: 4.1 mmol/L (ref 3.5–5.1)
Sodium: 134 mmol/L — ABNORMAL LOW (ref 135–145)

## 2023-04-21 MED ORDER — LOSARTAN POTASSIUM 50 MG PO TABS
50.0000 mg | ORAL_TABLET | Freq: Every day | ORAL | 0 refills | Status: DC
  Filled 2023-04-21: qty 30, 30d supply, fill #0

## 2023-04-21 MED ORDER — CEPHALEXIN 500 MG PO CAPS
500.0000 mg | ORAL_CAPSULE | Freq: Two times a day (BID) | ORAL | 0 refills | Status: DC
  Filled 2023-04-21: qty 12, 6d supply, fill #0

## 2023-04-21 NOTE — Progress Notes (Signed)
PROGRESS NOTE   Subjective/Complaints: Low back pain has resolved Excited for d/c home today Feels less confused though knows she does not know date and month, guesses that year is 2025  ROS:   Limited, but denies SOB, abd pain; N/V/C/D, +dysphagia, low back pain resolved Objective:   No results found. Recent Labs    04/20/23 1602  WBC 8.2  HGB 13.3  HCT 37.6  PLT 299    Recent Labs    04/20/23 1602 04/21/23 0524  NA 128* 134*  K 4.0 4.1  CL 91* 101  CO2 24 24  GLUCOSE 134* 106*  BUN 7* 7*  CREATININE 0.66 0.76  CALCIUM 9.8 9.6     Intake/Output Summary (Last 24 hours) at 04/21/2023 0915 Last data filed at 04/21/2023 0730 Gross per 24 hour  Intake 356 ml  Output --  Net 356 ml        Physical Exam: Vital Signs Blood pressure 131/66, pulse (!) 54, temperature 98.4 F (36.9 C), temperature source Oral, resp. rate 16, height 5\' 4"  (1.626 m), weight 58.3 kg, SpO2 100%.     General: awake, alert, slightly confused; sitting up in bed;  NAD HENT: conjugate gaze; oropharynx moist CV: bradycardic rate; no JVD Pulmonary: CTA B/L; no W/R/R- good air movement GI: soft, NT, ND, (+)BS Psychiatric: very flat Neurological: delayed responses still- didn't remember when grandsons had been here  Neurologic: Cranial nerves II through XII intact, motor strength is 5/5 in Left  deltoid, bicep, tricep, grip, 5/5 Bilateral hip flexor, knee extensors, ankle dorsiflexor and plantar flexor RUE 1+ grip, 1+ bi, 2/5 tri, 0/5 delt, exam stable 10/30 Sensory exam normal sensation to light touch and proprioception in bilateral upper and lower extremities Cerebellar exam Limited RUE due to weakness Musculoskeletal: Full range of motion in all 4 extremities. No joint swelling. Patient is alert and makes eye contact with examiner.  She follows simple commands.  She can name hospital and family members but not age or time.    speech without dysarthria or aphasia, exam stable 11/6     Assessment/Plan: 1. Functional deficits which require 3+ hours per day of interdisciplinary therapy in a comprehensive inpatient rehab setting. Physiatrist is providing close team supervision and 24 hour management of active medical problems listed below. Physiatrist and rehab team continue to assess barriers to discharge/monitor patient progress toward functional and medical goals  Care Tool:  Bathing    Body parts bathed by patient: Right arm, Chest, Abdomen, Front perineal area, Face, Buttocks, Right upper leg, Left upper leg   Body parts bathed by helper: Buttocks, Left lower leg, Right lower leg, Left upper leg, Right upper leg, Left arm Body parts n/a: Right lower leg, Left lower leg, Left arm   Bathing assist Assist Level: Moderate Assistance - Patient 50 - 74%     Upper Body Dressing/Undressing Upper body dressing   What is the patient wearing?: Bra, Pull over shirt    Upper body assist Assist Level: Moderate Assistance - Patient 50 - 74%    Lower Body Dressing/Undressing Lower body dressing      What is the patient wearing?: Underwear/pull up, Pants  Lower body assist Assist for lower body dressing: Moderate Assistance - Patient 50 - 74%     Toileting Toileting    Toileting assist Assist for toileting: Minimal Assistance - Patient > 75%     Transfers Chair/bed transfer  Transfers assist     Chair/bed transfer assist level: Minimal Assistance - Patient > 75%     Locomotion Ambulation   Ambulation assist      Assist level: Minimal Assistance - Patient > 75% Assistive device: Hand held assist Max distance: 264ft   Walk 10 feet activity   Assist     Assist level: Minimal Assistance - Patient > 75% Assistive device: Hand held assist   Walk 50 feet activity   Assist    Assist level: Minimal Assistance - Patient > 75% Assistive device: Hand held assist    Walk 150 feet  activity   Assist    Assist level: Minimal Assistance - Patient > 75% Assistive device: Hand held assist    Walk 10 feet on uneven surface  activity   Assist     Assist level: Minimal Assistance - Patient > 75% Assistive device: Hand held assist   Wheelchair     Assist Is the patient using a wheelchair?: No             Wheelchair 50 feet with 2 turns activity    Assist            Wheelchair 150 feet activity     Assist          Blood pressure 131/66, pulse (!) 54, temperature 98.4 F (36.9 C), temperature source Oral, resp. rate 16, height 5\' 4"  (1.626 m), weight 58.3 kg, SpO2 100%.  Medical Problem List and Plan: 1. 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  Antithrombotics: -DVT/anticoagulation:  Pharmaceutical: Eliquis             -antiplatelet therapy: N/A 3. Low back pain: Prescribed home Zynex  11/3- having low back pain- added lidoderm for back pain 2 patches 4. Mood/Behavior/Sleep: Provide emotional support             -antipsychotic agents: N/A 5. Neuropsych/cognition: This patient is not capable of making decisions on her own behalf.  11/2- pt not appropriate to decide if ready for d/c yet 6. Skin/Wound Care: Routine skin checks 7. Fluids/Electrolytes/Nutrition: Routine in and outs with follow-up chemistries 8.  New onset atrial fibrillation.  Continue Eliquis.  Cardiac rate controlled  11/3- rate in 50s this AM- will monitor closely.  9.  Hypertension. Clonidine stopped due to hyponatremia, Cozaar restarted  11.  Dysphagia.  Dysphagia #1, advanced to thins  11/3- still on D1 thins diet 12. Bradycardia: continue to monitor HR TID, decrease Cozaar to 50mg  daily, resolved.   11/2- HR is 50s again this AM- cozaar down to 25 mg daily-    11/3- HR  is 50 this AM- no BP meds that effect BP? 13. Cognitive deficits: continue SLP  14. Hyperglycemia: d/c Ensure and made goal to focus on eating regular meals  15. Transaminitis: d/c Tylenol  16. Vitamin D deficiency: ergocalciferol 50,000U once per week started, continue  17. Right wrist drop: resting hand splint ordered  18.  Insomnia: melatonin 3mg  started HS, continue  19. UTI: keflex started, f/u UC outpatient and I will contact patient if antibiotic needs to be adjusted   20. Back pain: continue lidocaine patch  21. Bradycardia: d/c clonidine given hyponatremia  22. Hyponatremia: improved with discontinuation of clonidine    >30 minutes spent in discharge of patient including review of medications and follow-up appointments, physical examination, and in answering all patient's questions   LOS: 9 days A FACE TO FACE EVALUATION WAS PERFORMED  Maeghan Canny P Graeden Bitner 04/21/2023, 9:15 AM

## 2023-04-22 ENCOUNTER — Encounter
Payer: Medicare Other | Attending: Physical Medicine and Rehabilitation | Admitting: Physical Medicine and Rehabilitation

## 2023-04-22 DIAGNOSIS — R4182 Altered mental status, unspecified: Secondary | ICD-10-CM

## 2023-04-22 NOTE — Progress Notes (Signed)
Attempted to call patient to discuss UC results but mailbox is full

## 2023-05-17 ENCOUNTER — Encounter
Payer: Medicare Other | Attending: Physical Medicine and Rehabilitation | Admitting: Physical Medicine and Rehabilitation

## 2023-05-17 ENCOUNTER — Encounter: Payer: Self-pay | Admitting: Physical Medicine and Rehabilitation

## 2023-05-17 VITALS — BP 130/76 | HR 65 | Ht 64.0 in | Wt 124.2 lb

## 2023-05-17 DIAGNOSIS — I639 Cerebral infarction, unspecified: Secondary | ICD-10-CM | POA: Insufficient documentation

## 2023-05-17 MED ORDER — LOSARTAN POTASSIUM 50 MG PO TABS: 50.0000 mg | ORAL_TABLET | Freq: Every day | ORAL | 3 refills | Status: AC

## 2023-05-17 MED ORDER — ATORVASTATIN CALCIUM 80 MG PO TABS: 80.0000 mg | ORAL_TABLET | Freq: Every day | ORAL | 3 refills | Status: AC

## 2023-05-17 MED ORDER — APIXABAN 5 MG PO TABS: 5.0000 mg | ORAL_TABLET | Freq: Two times a day (BID) | ORAL | 3 refills | Status: DC

## 2023-05-17 MED ORDER — CHOLECALCIFEROL 125 MCG (5000 UT) PO TABS: 5000.0000 [IU] | ORAL_TABLET | Freq: Every day | ORAL | 3 refills | Status: AC

## 2023-05-17 MED ORDER — LIDOCAINE 5 % EX PTCH: 2.0000 | MEDICATED_PATCH | Freq: Every day | CUTANEOUS | 3 refills | Status: AC

## 2023-05-17 NOTE — Patient Instructions (Addendum)
Fish, salmon Walnuts Blueberries Green tea Ginko biloba Brain Boost Coconut oil  Melatonin boosters: tart cherry juice, pistachios, cacao  Insomnia: -Try to go outside near sunrise -Get exercise during the day.  -Turn off all devices an hour before bedtime.  -Teas that can benefit: chamomile, valerian root, Brahmi (Bacopa) -Can consider over the counter melatonin, magnesium, and/or L-theanine. Melatonin is an anti-oxidant with multiple health benefits. Magnesium is involved in greater than 300 enzymatic reactions in the body and most of [place] are deficient as our soil is often depleted. There are 7 different types of magnesium- Bioptemizer's is a supplement with all 7 types, and each has unique benefits. Magnesium can also help with constipation and anxiety.  -Pistachios naturally increase the production of melatonin -Cozy Earth bamboo bed sheets are free from toxic chemicals.  -Tart cherry juice or a tart cherry supplement can improve sleep and soreness post-workout

## 2023-05-17 NOTE — Progress Notes (Signed)
Subjective:    Patient ID: [name], female    DOB: 1941-03-15, 82 y.o.   MRN: [id number]  HPI  1) CVA -Mrs. Denke is an 82 year old woman who presents for hospital follow-up for CVA - she has been having continued cognitive impairments -physically she has improved greatly  -indepenedent with use of rest room -she does have limitations in right hand strength  2) Insomnia: -son asks if she should restart melatonin Pain Inventory Average Pain 0 Pain Right Now 1 My pain is  not specified  LOCATION OF PAIN  neck  BOWEL Number of stools per week: 6-8  BLADDER Normal  Mobility walk without assistance ability to climb steps?  yes  Function retired I need assistance with the following:  meal prep, household duties, and shopping  Neuro/Psych No problems in this area  Prior Studies Any changes since last visit?  no  Physicians involved in your care Any changes since last visit?  no   Family History  Problem Relation Age of Onset   Cancer Mother    Heart attack Father    Social History   Socioeconomic History   Marital status: Widowed    Spouse name: Not on file   Number of children: 2   Years of education: Not on file   Highest education level: Not on file  Occupational History   Not on file  Tobacco Use   Smoking status: Never   Smokeless tobacco: Never  Vaping Use   Vaping status: Never Used  Substance and Sexual Activity   Alcohol use: No   Drug use: No   Sexual activity: Not on file  Other Topics Concern   Not on file  Social History Narrative   Not on file   Social Determinants of Health   Financial Resource Strain: Low Risk  (03/24/2020)   [facility]   Overall Financial Resource Strain (CARDIA)    Difficulty of Paying Living Expenses: Not hard at all  Food Insecurity: No Food Insecurity (04/06/2023)   Hunger Vital Sign    Worried About Running Out of Food in the Last Year: Never true    Ran Out of Food in the  Last Year: Never true  Transportation Needs: No Transportation Needs (04/06/2023)   PRAPARE - Administrator, Civil Service (Medical): No    Lack of Transportation (Non-Medical): No  Physical Activity: Not on file  Stress: Not on file  Social Connections: Unknown (03/24/2020)   [facility] from [facility]   Social Connection and Isolation Panel [NHANES]    Frequency of Communication with Friends and Family: More than three times a week    Frequency of Social Gatherings with Friends and Family: More than three times a week    Attends Religious Services: Not on file    Active Member of Clubs or Organizations: Not on file    Attends Banker Meetings: Not on file    Marital Status: Not on file   Past Surgical History:  Procedure Laterality Date   ABDOMINAL HYSTERECTOMY     CARDIAC CATHETERIZATION N/A 03/23/2016   Procedure: Left Heart Cath and Coronary Angiography;  Surgeon: Lennette Bihari, MD;  Location: [facility] INVASIVE CV LAB;  Service: Cardiovascular;  Laterality: N/A;   CHOLECYSTECTOMY     IR CT HEAD LTD  04/04/2023   IR PERCUTANEOUS ART THROMBECTOMY/INFUSION INTRACRANIAL INC DIAG ANGIO  04/04/2023   RADIOLOGY WITH ANESTHESIA N/A 04/04/2023   Procedure:  IR WITH ANESTHESIA;  Surgeon: Radiologist, Medication, MD;  Location: [facility] OR;  Service: Radiology;  Laterality: N/A;   VARICOSE VEIN SURGERY     Past Medical History:  Diagnosis Date   Chest pain    Coronary artery disease    Hypertension    LBBB (left bundle branch block)    BP 130/76   Pulse 65   Ht 5\' 4"  (1.626 m)   Wt 124 lb 3.2 oz (56.3 kg)   SpO2 96%   BMI 21.32 kg/m   Opioid Risk Score:   Fall Risk Score:  `1  Depression screen [facility] 2/9     05/17/2023    2:31 PM  Depression screen PH[facility] 2/9  Decreased Interest 2  Down, Depressed, Hopeless 2  PHQ - 2 Score 4  Altered sleeping 3  Tired, decreased energy 2  Change in appetite 0  Feeling bad or failure about yourself  2  Trouble  concentrating 3  Moving slowly or fidgety/restless 3  Suicidal thoughts 0  PHQ-9 Score 17  Difficult doing work/chores Somewhat difficult     Review of Systems  Constitutional: Negative.   HENT: Negative.    Eyes: Negative.   Respiratory: Negative.    Cardiovascular: Negative.   Gastrointestinal: Negative.   Endocrine: Negative.   Genitourinary: Negative.   Musculoskeletal: Negative.   Skin: Negative.   Allergic/Immunologic: Negative.   Neurological:  Positive for weakness.  Hematological:  Bruises/bleeds easily.       Apixaban  Psychiatric/Behavioral:  Positive for dysphoric mood.   All other systems reviewed and are negative.      Objective:   Physical Exam Gen: no distress, normal appearing HEENT: oral mucosa pink and moist, NCAT Cardio: Reg rate Chest: normal effort, normal rate of breathing Abd: soft, non-distended Ext: no edema Psych: pleasant, normal affect Skin: intact Neuro: Alert and oriented x3, 4/5 strength right hand      Assessment & Plan:   1) CVA -continue Eliquis  2) Insomnia: -Try to go outside near sunrise -Get exercise during the day.  -Turn off all devices an hour before bedtime.  -Teas that can benefit: chamomile, valerian root, Brahmi (Bacopa) -Can consider over the counter melatonin, magnesium, and/or L-theanine. Melatonin is an anti-oxidant with multiple health benefits. Magnesium is involved in greater than 300 enzymatic reactions in the body and most of [place] are deficient as our soil is often depleted. There are 7 different types of magnesium- Bioptemizer's is a supplement with all 7 types, and each has unique benefits. Magnesium can also help with constipation and anxiety.  -Pistachios naturally increase the production of melatonin -Cozy Earth bamboo bed sheets are free from toxic chemicals.  -Tart cherry juice or a tart cherry supplement can improve sleep and soreness post-workout   3) Low back pain -refilled lidocaine patch

## 2023-06-21 NOTE — Progress Notes (Signed)
 Cardiology Clinic Note   Patient Name: [name] Date of Encounter: 06/24/2023  Primary Care Provider:  Towana Montie SQUIBB, DO Primary Cardiologist:  Redell Shallow, MD  Patient Profile    [name] 83 year old female presents to the clinic today for follow-up evaluation of her essential hypertension and coronary artery disease.  Past Medical History    Past Medical History:  Diagnosis Date   Chest pain    Coronary artery disease    Hypertension    LBBB (left bundle branch block)    Past Surgical History:  Procedure Laterality Date   ABDOMINAL HYSTERECTOMY     CARDIAC CATHETERIZATION N/A 03/23/2016   Procedure: Left Heart Cath and Coronary Angiography;  Surgeon: Debby DELENA Sor, MD;  Location: [facility];  Service: Cardiovascular;  Laterality: N/A;   CHOLECYSTECTOMY     IR CT HEAD LTD  04/04/2023   IR PERCUTANEOUS ART THROMBECTOMY/INFUSION INTRACRANIAL INC DIAG ANGIO  04/04/2023   RADIOLOGY WITH ANESTHESIA N/A 04/04/2023   Procedure: IR WITH ANESTHESIA;  Surgeon: Radiologist, Medication, MD;  Location: [facility] OR;  Service: Radiology;  Laterality: N/A;   VARICOSE VEIN SURGERY      Allergies  Allergies  Allergen Reactions   Codeine Nausea And Vomiting   Penicillins Nausea And Vomiting    History of Present Illness    [name] has a PMH of essential hypertension, coronary artery disease, HLD, LBBB, CVA, lower extremity swelling, and precordial pain.  She was admitted to the hospital 04/04/2023 until 04/12/2023.  She was admitted with acute CVA and new onset atrial fibrillation.  Cardiology was consulted.  She described chest discomfort that was not felt to be related to cardiac issues.  She was tender to palpation.  Tylenol  analgesia was recommended.  Her EKG showed left branch block and atrial fibrillation with PVCs.  No further cardiac workup was recommended.  Rate control therapy was not recommended.  She was rate controlled without medication.   Guidance on DOAC was left to neurology.  Outpatient evaluation for possible DCCV was recommended.  Echocardiogram showed an EF of 45-50%.  She was euvolemic on exam.  Spironolactone was added to her medication regimen.  Her losartan  100 mg daily was continued.  She presents to the clinic today for follow-up evaluation and states she continues to do speech therapy poststroke.  She has been also doing physical therapy.  She has graduated from formal physical therapy.  She has good lower extremity strength but continues to recover with her right upper extremity.  We reviewed her hospitalization and atrial fibrillation.  She presents with her son.  They expressed understanding.  I discussed importance of deciding whether they want to pursue rate control versus DCCV.  At this time they wish to discuss interventions with the rest of the family before proceeding.  I will continue her current medication regimen, await decision, and plan follow-up in 2 to 3 months..  Today she denies chest pain, shortness of breath, lower extremity edema, fatigue, palpitations, melena, hematuria, hemoptysis, diaphoresis, weakness, presyncope, syncope, orthopnea, and PND.   Home Medications    Prior to Admission medications   Medication Sig Start Date End Date Taking? Authorizing Provider  acetaminophen  (TYLENOL ) 325 MG tablet Take 2 tablets (650 mg total) by mouth every 6 (six) hours as needed for mild pain (pain score 1-3). 04/19/23   Angiulli, Toribio PARAS, PA-C  apixaban  (ELIQUIS ) 5 MG TABS tablet Take 1 tablet (5 mg total) by mouth 2 (two)  times daily. 05/17/23   Raulkar, Sven SQUIBB, MD  atorvastatin  (LIPITOR ) 80 MG tablet Take 1 tablet (80 mg total) by mouth daily. 05/17/23   Raulkar, Sven SQUIBB, MD  Cholecalciferol  125 MCG (5000 UT) TABS Take 1 tablet (5,000 Units total) by mouth daily. 05/17/23   Raulkar, Sven SQUIBB, MD  lidocaine  (LIDODERM ) 5 % Place 2 patches onto the skin daily at 8 pm. Remove & Discard patch within 12 hours  or as directed by MD 05/17/23   Raulkar, Sven SQUIBB, MD  losartan  (COZAAR ) 50 MG tablet Take 1 tablet (50 mg total) by mouth daily. 05/17/23   Raulkar, Krutika P, MD  melatonin 3 MG TABS tablet Take 1 tablet (3 mg total) by mouth at bedtime. 04/20/23   Angiulli, Toribio PARAS, PA-C    Family History    Family History  Problem Relation Age of Onset   Cancer Mother    Heart attack Father    She indicated that her mother is deceased. She indicated that her father is deceased. She indicated that her maternal grandmother is deceased. She indicated that her maternal grandfather is deceased. She indicated that her paternal grandmother is deceased. She indicated that her paternal grandfather is deceased.  Social History    Social History   Socioeconomic History   Marital status: Widowed    Spouse name: Not on file   Number of children: 2   Years of education: Not on file   Highest education level: Not on file  Occupational History   Not on file  Tobacco Use   Smoking status: Never   Smokeless tobacco: Never  Vaping Use   Vaping status: Never Used  Substance and Sexual Activity   Alcohol use: No   Drug use: No   Sexual activity: Not on file  Other Topics Concern   Not on file  Social History Narrative   Not on file   Social Drivers of Health   Financial Resource Strain: Low Risk  (03/24/2020)   Received from [facility]   Overall Financial Resource Strain (CARDIA)    Difficulty of Paying Living Expenses: Not hard at all  Food Insecurity: No Food Insecurity (04/06/2023)   Hunger Vital Sign    Worried About Running Out of Food in the Last Year: Never true    Ran Out of Food in the Last Year: Never true  Transportation Needs: No Transportation Needs (04/06/2023)   PRAPARE - Administrator, Civil Service (Medical): No    Lack of Transportation (Non-Medical): No  Physical Activity: Not on file  Stress: Not on file  Social Connections: Unknown (03/24/2020)   Received  from [facility]   Social Connection and Isolation Panel [NHANES]    Frequency of Communication with Friends and Family: More than three times a week    Frequency of Social Gatherings with Friends and Family: More than three times a week    Attends Religious Services: Not on file    Active Member of Clubs or Organizations: Not on file    Attends Banker Meetings: Not on file    Marital Status: Not on file  Intimate Partner Violence: Not At Risk (04/06/2023)   Humiliation, Afraid, Rape, and Kick questionnaire    Fear of Current or Ex-Partner: No    Emotionally Abused: No    Physically Abused: No    Sexually Abused: No     Review of Systems    General:  No chills, fever, night sweats  or weight changes.  Cardiovascular:  No chest pain, dyspnea on exertion, edema, orthopnea, palpitations, paroxysmal nocturnal dyspnea. Dermatological: No rash, lesions/masses Respiratory: No cough, dyspnea Urologic: No hematuria, dysuria Abdominal:   No nausea, vomiting, diarrhea, bright red blood per rectum, melena, or hematemesis Neurologic:  No visual changes, wkns, changes in mental status. All other systems reviewed and are otherwise negative except as noted above.  Physical Exam    VS:  BP 132/88 (BP Location: Left Arm, Patient Position: Sitting, Cuff Size: Small)   Pulse (!) 41   Ht 5' 6 (1.676 m)   Wt 116 lb 12.8 oz (53 kg)   SpO2 95%   BMI 18.85 kg/m  , BMI Body mass index is 18.85 kg/m. GEN: Well nourished, well developed, in no acute distress. HEENT: normal. Neck: Supple, no JVD, carotid bruits, or masses. Cardiac: RRR, no murmurs, rubs, or gallops. No clubbing, cyanosis, edema.  Radials/DP/PT 2+ and equal bilaterally.  Respiratory:  Respirations regular and unlabored, clear to auscultation bilaterally. GI: Soft, nontender, nondistended, BS + x 4. MS: no deformity or atrophy. Skin: warm and dry, no rash. Neuro:  Strength and sensation are intact. Psych: Normal  affect.  Accessory Clinical Findings    Recent Labs: 04/04/2023: B Natriuretic Peptide 228.0; TSH 3.014 04/13/2023: ALT 70 04/20/2023: Hemoglobin 13.3; Magnesium  1.8; Platelets 299 04/21/2023: BUN 7; Creatinine, Ser 0.76; Potassium 4.1; Sodium 134   Recent Lipid Panel    Component Value Date/Time   CHOL 143 04/05/2023 0755   TRIG 49 04/05/2023 0755   HDL 46 04/05/2023 0755   CHOLHDL 3.1 04/05/2023 0755   VLDL 10 04/05/2023 0755   LDLCALC 87 04/05/2023 0755   LDLDIRECT 137.0 08/20/2008 0829         ECG personally reviewed by me today- EKG Interpretation Date/Time:  Thursday June 24 2023 15:50:19 EST Ventricular Rate:  62 PR Interval:    QRS Duration:  138 QT Interval:  462 QTC Calculation: 468 R Axis:   49  Text Interpretation: Atrial fibrillation with premature ventricular or aberrantly conducted complexes Left ventricular hypertrophy with QRS widening and repolarization abnormality ( Cornell product ) Cannot rule out Septal infarct , age undetermined When compared with ECG of 24-Jun-2023 15:46, No significant change was found Confirmed by Emelia Hazy [phone]) on 06/24/2023 3:56:07 PM    Echocardiogram 04/05/2023  IMPRESSIONS     1. Left ventricular ejection fraction, by estimation, is 45 to 50%. The  left ventricle has mildly decreased function. The left ventricle  demonstrates global hypokinesis. Left ventricular diastolic function could  not be evaluated.   2. Right ventricular systolic function is normal. The right ventricular  size is normal.   3. Left atrial size was mildly dilated.   4. Right atrial size was mildly dilated.   5. The mitral valve is normal in structure. Trivial mitral valve  regurgitation. No evidence of mitral stenosis.   6. The aortic valve is tricuspid. There is mild calcification of the  aortic valve. Aortic valve regurgitation is not visualized. No aortic  stenosis is present.   7. The inferior vena cava is normal in size with <50%  respiratory  variability, suggesting right atrial pressure of 8 mmHg.   FINDINGS   Left Ventricle: Left ventricular ejection fraction, by estimation, is 45  to 50%. The left ventricle has mildly decreased function. The left  ventricle demonstrates global hypokinesis. The left ventricular internal  cavity size was normal in size. There is   no left ventricular hypertrophy. Left  ventricular diastolic function  could not be evaluated due to atrial fibrillation. Left ventricular  diastolic function could not be evaluated.   Right Ventricle: The right ventricular size is normal. No increase in  right ventricular wall thickness. Right ventricular systolic function is  normal.   Left Atrium: Left atrial size was mildly dilated.   Right Atrium: Right atrial size was mildly dilated.   Pericardium: There is no evidence of pericardial effusion.   Mitral Valve: The mitral valve is normal in structure. Trivial mitral  valve regurgitation. No evidence of mitral valve stenosis.   Tricuspid Valve: The tricuspid valve is normal in structure. Tricuspid  valve regurgitation is mild . No evidence of tricuspid stenosis.   Aortic Valve: The aortic valve is tricuspid. There is mild calcification  of the aortic valve. Aortic valve regurgitation is not visualized. No  aortic stenosis is present. Aortic valve peak gradient measures 9.9 mmHg.   Pulmonic Valve: The pulmonic valve was normal in structure. Pulmonic valve  regurgitation is trivial. No evidence of pulmonic stenosis.   Aorta: The aortic root is normal in size and structure.   Venous: The inferior vena cava is normal in size with less than 50%  respiratory variability, suggesting right atrial pressure of 8 mmHg.   IAS/Shunts: No atrial level shunt detected by color flow Doppler.      Assessment & Plan   1.  Atrial fibrillation-EKG today shows afib 62 bpm.  Reports compliance with apixaban .  Denies bleeding issues.  Denies episodes of  accelerated or irregular heartbeat. This patients CHA2DS2-VASc Score and unadjusted Ischemic Stroke Rate (% per year) is equal to 11.2 % stroke rate/year from a score of 7. Above score calculated as 1 point each if present [CHF, HTN, Female] Above score calculated as 2 points each if present [Age > 75, or Stroke/TIA/TE].  We discussed options of rate control versus DCCV.  Her son wishes to talk with the rest of the family before deciding on intervention versus rate control.  Patient and son were informed that the longer patient stays in atrial fibrillation the less likely successful cardioversion will be.  They expressed understanding. Continue apixaban  Avoid triggers caffeine, chocolate, EtOH, dehydration etc.  Chest discomfort-resolved.  Previously noted to have chest wall discomfort with deep palpation. No plans for ischemic evaluation  Essential hypertension-BP today 132/88. Maintain blood pressure log Continue losartan  Heart healthy low-sodium diet  LBBB-heart rate today 62 bpm.  Denies episodes of lightheadedness, presyncope or syncope. Continue to monitor   Disposition: Follow-up in 2 to 3 months with Dr. Barbaraann or me.       Josefa HERO. Allyn Bertoni NP-C     06/24/2023, 4:18 PM [facility] [address] Office [phone] Fax [phone]    I spent 14 minutes examining this patient, reviewing medications, and using patient centered shared decision making involving her cardiac care.   I spent greater than 20 minutes reviewing her past medical history,  medications, and prior cardiac tests.

## 2023-06-24 ENCOUNTER — Encounter: Payer: Self-pay | Admitting: General Practice

## 2023-06-24 ENCOUNTER — Ambulatory Visit: Payer: Medicare Other | Attending: General Practice | Admitting: General Practice

## 2023-06-24 VITALS — BP 132/88 | HR 41 | Ht 66.0 in | Wt 116.8 lb

## 2023-06-24 DIAGNOSIS — I10 Essential (primary) hypertension: Secondary | ICD-10-CM

## 2023-06-24 DIAGNOSIS — R0789 Other chest pain: Secondary | ICD-10-CM | POA: Diagnosis not present

## 2023-06-24 DIAGNOSIS — I447 Left bundle-branch block, unspecified: Secondary | ICD-10-CM | POA: Diagnosis not present

## 2023-06-24 DIAGNOSIS — I4891 Unspecified atrial fibrillation: Secondary | ICD-10-CM

## 2023-06-24 NOTE — Patient Instructions (Addendum)
 Medication Instructions:  The current medical regimen is effective;  continue present plan and medications as directed. Please refer to the Current Medication list given to you today.  *If you need a refill on your cardiac medications before your next appointment, please call your pharmacy*  Lab Work: NONE If you have labs (blood work) drawn today and your tests are completely normal, you will receive your results only by:  MyChart Message (if you have MyChart) OR  A paper copy in the mail If you have any lab test that is abnormal or we need to change your treatment, we will call you to review the results.  Testing/Procedures: NONE   Follow-Up: At [facility], you and your health needs are our priority.  As part of our continuing mission to provide you with exceptional heart care, we have created designated Provider Care Teams.  These Care Teams include your primary Cardiologist (physician) and Advanced Practice Providers (APPs -  Physician Assistants and Nurse Practitioners) who all work together to provide you with the care you need, when you need it.  We recommend signing up for the patient portal called MyChart.  Sign up information is provided on this After Visit Summary.  MyChart is used to connect with patients for Virtual Visits (Telemedicine).  Patients are able to view lab/test results, encounter notes, upcoming appointments, etc.  Non-urgent messages can be sent to your provider as well.   To learn more about what you can do with MyChart, go to [email or web address].    Your next appointment:   2-3 month(s) IF YOU DO NOT HAVE THE CARTIOVERSION  Provider:   Darryle ONEIDA Decent, MD  or Josefa Beauvais, FNP        Other Instructions MAKE SURE TO CALL AND LET US  KNOW IF YOU WANT TO HAVE THE CARDIOVERSION  MAINTAIN PHYSICAL ACTIVITY Please try to avoid these triggers: Do not use any products that have nicotine or tobacco in them. These include cigarettes, e-cigarettes, and  chewing tobacco. If you need help quitting, ask your doctor. Eat heart-healthy foods. Talk with your doctor about the right eating plan for you. Exercise regularly as told by your doctor. Stay hydrated Do not drink alcohol, Caffeine or chocolate. Lose weight if you are overweight. Do not use drugs, including cannabis

## 2023-07-21 ENCOUNTER — Other Ambulatory Visit: Payer: Self-pay

## 2023-07-21 NOTE — Patient Outreach (Signed)
Telephone outreach to patient's son to obtain mRS was successfully completed. MRS= 3

## 2023-07-22 ENCOUNTER — Inpatient Hospital Stay: Payer: Medicare Other | Admitting: Neurology

## 2023-08-05 ENCOUNTER — Ambulatory Visit: Payer: Medicare Other | Admitting: Neurology

## 2023-08-05 ENCOUNTER — Inpatient Hospital Stay: Payer: Medicare Other | Admitting: Neurology

## 2023-08-05 ENCOUNTER — Encounter: Payer: Self-pay | Admitting: Neurology

## 2023-08-05 VITALS — BP 174/73 | HR 41 | Ht 65.0 in | Wt 125.4 lb

## 2023-08-05 DIAGNOSIS — F0150 Vascular dementia without behavioral disturbance: Secondary | ICD-10-CM

## 2023-08-05 DIAGNOSIS — I63412 Cerebral infarction due to embolism of left middle cerebral artery: Secondary | ICD-10-CM | POA: Diagnosis not present

## 2023-08-05 DIAGNOSIS — I5022 Chronic systolic (congestive) heart failure: Secondary | ICD-10-CM

## 2023-08-05 DIAGNOSIS — I4891 Unspecified atrial fibrillation: Secondary | ICD-10-CM | POA: Diagnosis not present

## 2023-08-05 DIAGNOSIS — F322 Major depressive disorder, single episode, severe without psychotic features: Secondary | ICD-10-CM

## 2023-08-05 DIAGNOSIS — I69331 Monoplegia of upper limb following cerebral infarction affecting right dominant side: Secondary | ICD-10-CM

## 2023-08-05 MED ORDER — MEMANTINE HCL 10 MG PO TABS: 10.0000 mg | ORAL_TABLET | Freq: Two times a day (BID) | ORAL | 5 refills | Status: DC

## 2023-08-05 MED ORDER — MEMANTINE HCL 28 X 5 MG & 21 X 10 MG PO TABS: ORAL_TABLET | ORAL | 12 refills | Status: DC

## 2023-08-05 NOTE — Patient Instructions (Signed)
I had a long d/w patient about his recent stroke, new diagnosis of atrial fibrillation and vascular dementia risk for recurrent stroke/TIAs, personally independently reviewed imaging studies and stroke evaluation results and answered questions.Continue Eliquis (apixaban) daily  for secondary stroke prevention and maintain strict control of hypertension with blood pressure goal below 130/90, diabetes with hemoglobin A1c goal below 6.5% and lipids with LDL cholesterol goal below 70 mg/dL. I also advised the patient to eat a healthy diet with plenty of whole grains, cereals, fruits and vegetables, exercise regularly and maintain ideal body weight .check dementia panel labs, EEG and MRI.  Trial of Namenda starter pack and if tolerated without side effects 10 mg twice daily.  Followup in the future with my nurse practitioner in 3 months or call earlier if necessary.  Dementia Caregiver Guide Dementia is a condition that affects the way the brain works. It often affects thinking and memory. A person with dementia may: Forget things. Have trouble talking or responding to your questions. Have trouble paying attention. Have trouble thinking clearly and making good decisions. Get lost or wander away from home or other places. Have big changes in their mood or emotions. They may: Feel very worried, nervous, or depressed. Have angry outbursts. Be suspicious or accuse you of things. Have childlike behavior and language. Taking care of someone with dementia can be a challenge. The tips below can help you care for the person. How to help manage lifestyle changes Dementia usually gets worse slowly over time. In the early stages, people with dementia can stay safe and take care of themselves with some help. In later stages, they need help with daily tasks like getting dressed, grooming, and going to the bathroom. Communicating When the person is talking and seems frustrated, make eye contact and hold the person's  hand. Ask questions that can be answered with a yes or no. Use simple words and a calm voice. Only give one direction at a time. Limit choices for the person. Too many choices can be stressful. Avoid correcting the person in a negative way. If the person can't find the right words, gently try to help. Preventing injury  Keep floors clear. Remove rugs, magazine racks, and floor lamps. Keep hallways well lit, especially at night. Put a handrail and nonslip mat in the bathtub or shower. Put childproof locks on cabinets that have dangerous items in them. These items include medicine, alcohol, guns, cleaning products, and sharp tools. For doors to the outside, put locks where the person can't see or reach them. This helps keep the person from going out of the house and getting lost. Be ready for emergencies. Keep a list of emergency phone numbers and addresses close by. Remove car keys and lock garage doors so the person doesn't try to drive. Have the person wear a bracelet that tracks where they are and shows that they're a person with memory loss. This should be worn at all times for safety. Helping with daily life  Keep the person on track with their daily routine. Try to identify areas where the person may need help. Be supportive, patient, calm, and encouraging. Gently remind the person that adjusting to changes takes time. Help with the tasks that the person has asked for help with. Keep the person involved in daily tasks and decisions as much as you can. Encourage conversation, but try not to get frustrated if the person struggles to find words or doesn't seem to appreciate your help. Other tips Think about  any safety risks and take steps to avoid them. Keep things organized: Organize medicines in a pill box for each day of the week. Keep a calendar in a central place. Use it to remind the person of health care visits or other activities. Create a plan to handle any legal or financial  matters. Get help from a professional if needed. Help make sure the person: Takes medicines only as told by their health care providers. Eats regular, healthy meals. They should also drink plenty of fluids. Goes to all scheduled health care appointments. Gets regular sleep. Taking care of yourself Being a caregiver for someone with dementia can be hard. You may feel stressed and have many other emotions. It's important to also take care of yourself. Here are some tips: Find out about services that can provide short-term care for the person. This is called respite care. It can allow you to take a break when you need one. Find healthy ways to deal with stress. Some ways include: Spending time with other people. Exercising. Meditating or doing deep breathing exercises. Take care of your own health by: Getting enough sleep. Eating healthy foods. Getting regular exercise. Join a support group with others who are caregivers. These groups can help you: Learn other ways to deal with stress. Share experiences with others. Get emotional comfort and support. Learn about caregiving as the disease gets worse. Find resources in your community. Where to find support: Many people and organizations offer support. These include: Support groups for people with dementia. Support groups for caregivers. Counselors or therapists. Home health care services. Adult day care centers. Where to find more information Alzheimer's Association: WesternTunes.it Family Caregiver Alliance: caregiver.org Alzheimer's Foundation of [place]: alzfdn.org Contact a health care provider if: The person's health is quickly getting worse. You're no longer able to care for the person. Caring for the person is affecting your physical and emotional health. You're feeling worried, nervous, or depressed about caring for the person. Get help right away if: You feel like the person may hurt themselves or others. The person has talked  about taking their own life. These symptoms may be an emergency. Take one of these steps right away: Go to your nearest emergency room. Call 911. Call the National Suicide Prevention Lifeline at 787-495-4927 or 988. Text the Crisis Text Line at 863-816-0905. This information is not intended to replace advice given to you by your health care provider. Make sure you discuss any questions you have with your health care provider. Document Revised: 09/11/2022 Document Reviewed: 09/11/2022 Elsevier Patient Education  2024 [employer].

## 2023-08-05 NOTE — Progress Notes (Signed)
[facility] [address] [place]. [place] [address] [phone]       OFFICE FOLLOW-UP NOTE  Ms. Casandra Doffing Date of Birth:  1940/08/22 Medical Record Number:  [id number]   HPI: Ms. Marchiano is a 83 year old Caucasian lady seen today for initial office follow-up visit following hospital admission for stroke in October 2024.  She is accompanied by her grandson.  History is obtained from them and review of electronic medical records.  I personally reviewed pertinent available imaging films in  PACS.  She has past medical history of coronary artery disease, hypertension, left bundle branch block and chest pain.  She presented on 04/04/2023 to [facility], ED with acute onset of right-sided weakness, facial droop and aphasia.  She was last seen normal the night prior.  She was not candidate for TNK being outside the window.  CT angiogram of the head and neck revealed left M1 occlusion.  CT perfusion showed 0 core infarct but penumbra of 146 cc.  NIH stroke scale was 19.  She was transferred emergently to [facility] for emergent thrombectomy which was performed after discussion of risk-benefit with family.  Procedure successful with excellent revascularization.  She was found to have some mild confusion disorientation and right-sided weakness.  She was transferred to inpatient rehab where she did well and has returned home living with care of family.  She is able to ambulate independently but does have some mild right-sided weakness and drags her right leg.  She has had no falls or injuries.  She was found to be in new onset A-fib and has been started on Eliquis which she is tolerating well with minor bruising and no bleeding.  Patient however has significant cognitive worsening from the stroke.  She is quite confused and disoriented.  She needs help with her medications and making meals for her.  Previously she was living independently but now family has to observe her and  provide help as needed.  She has had no recurrent stroke or TIA symptoms.  On cognitive testing today she did quite poorly and scored 12/30.  ROS:   14 system review of systems is positive for memory loss, confusion, disorientation, weakness, gait difficulty, bruising and all other systems negative  PMH:  Past Medical History:  Diagnosis Date   Chest pain    Coronary artery disease    Hypertension    LBBB (left bundle branch block)     Social History:  Social History   Socioeconomic History   Marital status: Widowed    Spouse name: Not on file   Number of children: 2   Years of education: Not on file   Highest education level: Not on file  Occupational History   Not on file  Tobacco Use   Smoking status: Never   Smokeless tobacco: Never  Vaping Use   Vaping status: Never Used  Substance and Sexual Activity   Alcohol use: No   Drug use: No   Sexual activity: Not on file  Other Topics Concern   Not on file  Social History Narrative   Pt lives with son at home.    Social Drivers of Corporate investment banker Strain: Low Risk  (03/24/2020)   Received from [facility]   Overall Financial Resource Strain (CARDIA)    Difficulty of Paying Living Expenses: Not hard at all  Food Insecurity: No Food Insecurity (04/06/2023)   Hunger Vital Sign    Worried About Running Out of  Food in the Last Year: Never true    Ran Out of Food in the Last Year: Never true  Transportation Needs: No Transportation Needs (04/06/2023)   PRAPARE - Administrator, Civil Service (Medical): No    Lack of Transportation (Non-Medical): No  Physical Activity: Not on file  Stress: Not on file  Social Connections: Unknown (03/24/2020)   Received from [facility]   Social Connection and Isolation Panel [NHANES]    Frequency of Communication with Friends and Family: More than three times a week    Frequency of Social Gatherings with Friends and Family: More than three times a week     Attends Religious Services: Not on file    Active Member of Clubs or Organizations: Not on file    Attends Banker Meetings: Not on file    Marital Status: Not on file  Intimate Partner Violence: Not At Risk (04/06/2023)   Humiliation, Afraid, Rape, and Kick questionnaire    Fear of Current or Ex-Partner: No    Emotionally Abused: No    Physically Abused: No    Sexually Abused: No    Medications:   Current Outpatient Medications on File Prior to Visit  Medication Sig Dispense Refill   acetaminophen (TYLENOL) 325 MG tablet Take 2 tablets (650 mg total) by mouth every 6 (six) hours as needed for mild pain (pain score 1-3).     apixaban (ELIQUIS) 5 MG TABS tablet Take 1 tablet (5 mg total) by mouth 2 (two) times daily. 180 tablet 3   Cholecalciferol 125 MCG (5000 UT) TABS Take 1 tablet (5,000 Units total) by mouth daily. 30 tablet 3   losartan (COZAAR) 50 MG tablet Take 1 tablet (50 mg total) by mouth daily. 90 tablet 3   melatonin 3 MG TABS tablet Take 1 tablet (3 mg total) by mouth at bedtime. 30 tablet 0   atorvastatin (LIPITOR) 80 MG tablet Take 1 tablet (80 mg total) by mouth daily. (Patient not taking: Reported on 08/05/2023) 90 tablet 3   lidocaine (LIDODERM) 5 % Place 2 patches onto the skin daily at 8 pm. Remove & Discard patch within 12 hours or as directed by MD (Patient not taking: Reported on 08/05/2023) 30 patch 3   No current facility-administered medications on file prior to visit.    Allergies:   Allergies  Allergen Reactions   Codeine Nausea And Vomiting   Penicillins Nausea And Vomiting    Physical Exam General: Pleasant frail elderly Caucasian lady, seated, in no evident distress Head: head normocephalic and atraumatic.  Neck: supple with no carotid or supraclavicular bruits Cardiovascular: regular rate and rhythm, no murmurs Musculoskeletal: no deformity Skin:  no rash/petichiae Vascular:  Normal pulses all extremities Vitals:   08/05/23 1447   BP: (!) 174/73  Pulse: (!) 41   Neurologic Exam Mental Status: Awake and fully alert. Oriented to place and time. Recent and remote memory poor. Attention span, concentration and fund of knowledge diminished. Mood and affect appropriate.  Diminished recall 0/3.  Able to name only 7 animals which can walk on 4 legs.  Mini-Mental status score 12/30.  Clock drawing 1/4.  Functional activities questionnaire score 27 stating significant dependence. Cranial Nerves: Fundoscopic exam reveals sharp disc margins. Pupils equal, briskly reactive to light. Extraocular movements full without nystagmus. Visual fields full to confrontation. Hearing intact. Facial sensation intact.  Right lower facial weakness., tongue, palate moves normally and symmetrically.  Motor: Mild spastic right paresis with 4/5  strength in the right upper extremity and 4+/5 strength in right lower extremity.  Slight increased tone on the right.  Weakness of right grip intrinsic hand muscles.. Sensory.: intact to touch ,pinprick .position and vibratory sensation.  Coordination: Rapid alternating movements normal in all extremities. Finger-to-nose and heel-to-shin performed accurately bilaterally. Gait and Station: Arises from chair without difficulty. Stance is normal. Gait demonstrates slight dragging of the right leg.  Unable to walk tandem.   Reflexes: 1+ and asymmetric and brisker on the right. Toes downgoing.   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     PLAN:I had a long d/w patient about his recent stroke, new diagnosis of atrial fibrillation and vascular dementia risk for recurrent stroke/TIAs, personally independently reviewed imaging studies and stroke evaluation results and answered questions.Continue Eliquis (apixaban) daily  for secondary stroke prevention and maintain strict control of hypertension with blood pressure goal below 130/90, diabetes with hemoglobin A1c goal below 6.5% and lipids with LDL cholesterol goal below 70 mg/dL. I also advised the patient to eat a healthy diet with plenty of whole grains, cereals, fruits and vegetables, exercise regularly and maintain ideal body weight .check dementia panel labs, EEG and MRI.  Trial of Namenda starter pack and if tolerated without side effects 10 mg twice daily.  Followup in the future with my nurse practitioner in 3 months or call earlier if necessary.  Greater than 50% of time during this prolonged 45 minute visit was spent on counseling,explanation of diagnosis of embolic stroke, new onset atrial fibrillation, vascular dementia, planning of further management, discussion with patient and family and coordination of care [name], MD Note: This document was prepared with digital dictation and possible smart phrase technology. Any transcriptional errors that result from this process are unintentional

## 2023-08-07 LAB — DEMENTIA PANEL
Homocysteine: 12.5 umol/L (ref 0.0–21.3)
RPR Ser Ql: NONREACTIVE
TSH: 1.68 u[IU]/mL (ref 0.450–4.500)
Vitamin B-12: 708 pg/mL (ref 232–1245)

## 2023-08-09 ENCOUNTER — Telehealth: Payer: Self-pay | Admitting: Neurology

## 2023-08-09 NOTE — Telephone Encounter (Signed)
 [employer] medicare NPR sent to GI [phone]

## 2023-08-23 ENCOUNTER — Ambulatory Visit: Payer: Medicare Other | Admitting: Neurology

## 2023-08-23 DIAGNOSIS — R4182 Altered mental status, unspecified: Secondary | ICD-10-CM

## 2023-08-23 DIAGNOSIS — F0150 Vascular dementia without behavioral disturbance: Secondary | ICD-10-CM

## 2023-08-23 DIAGNOSIS — I63412 Cerebral infarction due to embolism of left middle cerebral artery: Secondary | ICD-10-CM

## 2023-08-23 NOTE — Progress Notes (Signed)
 Kindly inform the patient that lab work for reversible causes of memory loss was all satisfactory

## 2023-08-27 ENCOUNTER — Ambulatory Visit
Admission: RE | Admit: 2023-08-27 | Discharge: 2023-08-27 | Disposition: A | Discharge status: Home / Self Care | Payer: Medicare Other | Source: Ambulatory Visit | Attending: Neurology | Admitting: Neurology

## 2023-08-27 DIAGNOSIS — I63412 Cerebral infarction due to embolism of left middle cerebral artery: Secondary | ICD-10-CM | POA: Diagnosis not present

## 2023-08-27 MED ORDER — GADOPICLENOL 0.5 MMOL/ML IV SOLN
6.0000 mL | Freq: Once | INTRAVENOUS | Status: AC | PRN
  Administered 2023-08-27: 6 mL via INTRAVENOUS

## 2023-09-02 ENCOUNTER — Telehealth: Payer: Self-pay | Admitting: *Deleted

## 2023-09-02 NOTE — Telephone Encounter (Signed)
 Pt returned phone call, would like a call back.

## 2023-09-02 NOTE — Telephone Encounter (Signed)
-----   Message from [name] sent at 09/02/2023  2:51 PM EDT ----- [name] inform the patient that EEG of brainwave study was normal.  No seizure activity noted

## 2023-09-02 NOTE — Telephone Encounter (Signed)
 Left message for patient son Fayrene Fearing to call to relay the below.

## 2023-09-02 NOTE — Telephone Encounter (Signed)
 Contacted pt son, was unaware he called back due to call not being routed.  I went over MRI brain and EEG with him. He asked if it was anything they needed to do. I informed him to keep FU appt as scheduled. advised the patient to eat a healthy diet with plenty of whole grains, cereals, fruits and vegetables, exercise regularly and maintain ideal body weight. Continue with current meds as directed. He verbally understood and was apperceive.

## 2023-09-02 NOTE — Progress Notes (Signed)
 Kindly inform the patient that EEG of brainwave study was normal.  No seizure activity noted.

## 2023-09-02 NOTE — Progress Notes (Signed)
 Kindly inform the patient that MRI scan of the brain shows expected evolutionary changes from her previous stroke in October 2024.  There are mild changes of hardening of the arteries and shrinkage of the brain.  No new or worrisome abnormalities.

## 2023-10-06 NOTE — Progress Notes (Signed)
 HPI: FU atrial fibrillation and CAD. Cardiac catheterization October 2017 showed mild LV dysfunction, ejection fraction 45%, 40-50% ostial and proximal LAD with normal left circumflex and right coronary artery. Medical therapy recommended.  Patient admitted with acute CVA and new onset atrial fibrillation October 2024.  Echocardiogram October 2024 showed ejection fraction 45 to 50%, mild biatrial enlargement.  At most recent office visit in January 2025 there was discussion concerning cardioversion but they wished to consider before proceeding.  Since last seen, patient denies dyspnea, chest pain, palpitations, syncope or bleeding.  Minimal pedal edema.  Current Outpatient Medications  Medication Sig Dispense Refill   acetaminophen  (TYLENOL ) 325 MG tablet Take 2 tablets (650 mg total) by mouth every 6 (six) hours as needed for mild pain (pain score 1-3).     atorvastatin  (LIPITOR ) 80 MG tablet Take 1 tablet (80 mg total) by mouth daily. 90 tablet 3   Cholecalciferol  125 MCG (5000 UT) TABS Take 1 tablet (5,000 Units total) by mouth daily. 30 tablet 3   lidocaine  (LIDODERM ) 5 % Place 2 patches onto the skin daily at 8 pm. Remove & Discard patch within 12 hours or as directed by MD 30 patch 3   losartan  (COZAAR ) 50 MG tablet Take 1 tablet (50 mg total) by mouth daily. 90 tablet 3   melatonin 3 MG TABS tablet Take 1 tablet (3 mg total) by mouth at bedtime. 30 tablet 0   memantine  (NAMENDA  TITRATION PAK) tablet pack 5 mg/day for =1 week; 5 mg twice daily for =1 week; 15 mg/day given in 5 mg and 10 mg separated doses for =1 week; then 10 mg twice daily 49 tablet 12   memantine  (NAMENDA ) 10 MG tablet Take 1 tablet (10 mg total) by mouth 2 (two) times daily. 60 tablet 5   apixaban  (ELIQUIS ) 2.5 MG TABS tablet Take 1 tablet (2.5 mg total) by mouth 2 (two) times daily. 180 tablet 3   No current facility-administered medications for this visit.     Past Medical History:  Diagnosis Date   Chest pain     Coronary artery disease    Hypertension    LBBB (left bundle branch block)     Past Surgical History:  Procedure Laterality Date   ABDOMINAL HYSTERECTOMY     CARDIAC CATHETERIZATION N/A 03/23/2016   Procedure: Left Heart Cath and Coronary Angiography;  Surgeon: Millicent Ally, MD;  Location: [facility] INVASIVE CV LAB;  Service: Cardiovascular;  Laterality: N/A;   CHOLECYSTECTOMY     IR CT HEAD LTD  04/04/2023   IR PERCUTANEOUS ART THROMBECTOMY/INFUSION INTRACRANIAL INC DIAG ANGIO  04/04/2023   RADIOLOGY WITH ANESTHESIA N/A 04/04/2023   Procedure: IR WITH ANESTHESIA;  Surgeon: Radiologist, Medication, MD;  Location: [facility] OR;  Service: Radiology;  Laterality: N/A;   VARICOSE VEIN SURGERY      Social History   Socioeconomic History   Marital status: Widowed    Spouse name: Not on file   Number of children: 2   Years of education: Not on file   Highest education level: Not on file  Occupational History   Not on file  Tobacco Use   Smoking status: Never   Smokeless tobacco: Never  Vaping Use   Vaping status: Never Used  Substance and Sexual Activity   Alcohol use: No   Drug use: No   Sexual activity: Not on file  Other Topics Concern   Not on file  Social History Narrative   Pt lives with  son at home.    Social Drivers of Corporate investment banker Strain: Low Risk  (03/24/2020)   Received from [facility]   Overall Financial Resource Strain (CARDIA)    Difficulty of Paying Living Expenses: Not hard at all  Food Insecurity: No Food Insecurity (04/06/2023)   Hunger Vital Sign    Worried About Running Out of Food in the Last Year: Never true    Ran Out of Food in the Last Year: Never true  Transportation Needs: No Transportation Needs (04/06/2023)   PRAPARE - Administrator, Civil Service (Medical): No    Lack of Transportation (Non-Medical): No  Physical Activity: Not on file  Stress: Not on file  Social Connections: Unknown (03/24/2020)   Received from  [facility]   Social Connection and Isolation Panel [NHANES]    Frequency of Communication with Friends and Family: More than three times a week    Frequency of Social Gatherings with Friends and Family: More than three times a week    Attends Religious Services: Not on file    Active Member of Clubs or Organizations: Not on file    Attends Banker Meetings: Not on file    Marital Status: Not on file  Intimate Partner Violence: Not At Risk (04/06/2023)   Humiliation, Afraid, Rape, and Kick questionnaire    Fear of Current or Ex-Partner: No    Emotionally Abused: No    Physically Abused: No    Sexually Abused: No    Family History  Problem Relation Age of Onset   Cancer Mother    Heart attack Father     ROS: no fevers or chills, productive cough, hemoptysis, dysphasia, odynophagia, melena, hematochezia, dysuria, hematuria, rash, seizure activity, orthopnea, PND, claudication. Remaining systems are negative.  Physical Exam: Well-developed well-nourished in no acute distress.  Skin is warm and dry.  HEENT is normal.  Neck is supple.  Chest is clear to auscultation with normal expansion.  Cardiovascular exam is irregular Abdominal exam nontender or distended. No masses palpated. Extremities show no edema. neuro grossly intact  EKG Interpretation Date/Time:  Tuesday Oct 19 2023 16:32:08 EDT Ventricular Rate:  62 PR Interval:    QRS Duration:  134 QT Interval:  450 QTC Calculation: 456 R Axis:   -69  Text Interpretation: Atrial fibrillation with premature ventricular or aberrantly conducted complexes Left axis deviation Left bundle branch block Confirmed by Alexandria Angel ([id number]) on 10/19/2023 4:33:55 PM    A/P  1 persistent atrial fibrillation-continue apixaban ; note weight is less than 60 kg and she is greater than 62 years old; decrease dose to 2.5 mg twice daily.  Long discussion today concerning cardioversion versus rate control.  She is asymptomatic  and she does not want to pursue cardioversion.  Her rate is controlled on no medications.  She understands that the longer she is in atrial fibrillation the less likely we would be to reestablish sinus rhythm in the future.  2 history of mild nonobstructive coronary artery disease-continue statin.  3 hypertension-patient's blood pressure is relying.  However they follow this at home and it is typically controlled.  Continue present medications.  4 cardiomyopathy-LV function mildly reduced on most recent echocardiogram.  Similar to ejection fraction noted in 2017 at time of cardiac catheterization.  Continue low-dose ARB.  Heart rate will not allow beta-blockade.  5 history of lower extremity edema-improved compared to last office visit.  6 hyperlipidemia-continue statin.  Lipids and liver monitored  by primary care.  5 left bundle branch block  Alexandria Angel, MD

## 2023-10-14 ENCOUNTER — Inpatient Hospital Stay: Payer: Medicare Other | Admitting: Neurology

## 2023-10-19 ENCOUNTER — Ambulatory Visit: Payer: Medicare Other | Attending: Cardiology | Admitting: Cardiology

## 2023-10-19 ENCOUNTER — Encounter: Payer: Self-pay | Admitting: Cardiology

## 2023-10-19 VITALS — BP 142/78 | HR 66 | Ht 65.0 in | Wt 126.0 lb

## 2023-10-19 DIAGNOSIS — I2510 Atherosclerotic heart disease of native coronary artery without angina pectoris: Secondary | ICD-10-CM

## 2023-10-19 DIAGNOSIS — I4891 Unspecified atrial fibrillation: Secondary | ICD-10-CM

## 2023-10-19 DIAGNOSIS — I10 Essential (primary) hypertension: Secondary | ICD-10-CM | POA: Diagnosis not present

## 2023-10-19 MED ORDER — APIXABAN 2.5 MG PO TABS: 2.5000 mg | ORAL_TABLET | Freq: Two times a day (BID) | ORAL | 3 refills | Status: AC

## 2023-10-19 NOTE — Patient Instructions (Signed)
 Medication Instructions:   DECREASE ELIQUIS  TO 2.5 MG TWICE DAILY= 1/2 OF THE 5 MG TABLET TWICE DAILY  *If you need a refill on your cardiac medications before your next appointment, please call your pharmacy*   Follow-Up: At [facility], you and your health needs are our priority.  As part of our continuing mission to provide you with exceptional heart care, our providers are all part of one team.  This team includes your primary Cardiologist (physician) and Advanced Practice Providers or APPs (Physician Assistants and Nurse Practitioners) who all work together to provide you with the care you need, when you need it.  Your next appointment:   6 month(s)  Provider:   Alexandria Angel MD  We recommend signing up for the patient portal called "MyChart".  Sign up information is provided on this After Visit Summary.  MyChart is used to connect with patients for Virtual Visits (Telemedicine).  Patients are able to view lab/test results, encounter notes, upcoming appointments, etc.  Non-urgent messages can be sent to your provider as well.   To learn more about what you can do with MyChart, go to [email or web address].

## 2023-11-15 ENCOUNTER — Encounter
Payer: Medicare Other | Attending: Physical Medicine and Rehabilitation | Admitting: Physical Medicine and Rehabilitation

## 2023-11-15 ENCOUNTER — Encounter: Payer: Self-pay | Admitting: Physical Medicine and Rehabilitation

## 2023-11-15 VITALS — BP 154/76 | HR 60 | Ht 65.0 in | Wt 126.8 lb

## 2023-11-15 DIAGNOSIS — I639 Cerebral infarction, unspecified: Secondary | ICD-10-CM | POA: Diagnosis not present

## 2023-11-15 DIAGNOSIS — M545 Low back pain, unspecified: Secondary | ICD-10-CM | POA: Diagnosis not present

## 2023-11-15 DIAGNOSIS — I4891 Unspecified atrial fibrillation: Secondary | ICD-10-CM

## 2023-11-15 DIAGNOSIS — G4701 Insomnia due to medical condition: Secondary | ICD-10-CM | POA: Diagnosis present

## 2023-11-15 NOTE — Progress Notes (Signed)
 Subjective:    Patient ID: [name], female    DOB: [birth year], 83 y.o.   MRN: [id number]  HPI  1) CVA -Mrs. Wendell is an 83 year old woman who presents for hospital follow-up for CVA - she has been having continued cognitive impairments -physically she has improved greatly  -indepenedent with use of rest room -she does have limitations in right hand strength -getting around well at home  2) Insomnia: -son asks if she should restart melatonin -sleeping well at home  3) HTN: -blood pressure runs well at home -she was doing cleaning at home today   Pain Inventory Average Pain 0 Pain Right Now 1 My pain is not specified  LOCATION OF PAIN  neck  BOWEL Number of stools per week: 6-8  BLADDER Normal  Mobility walk without assistance ability to climb steps?  yes  Function retired I need assistance with the following:  meal prep, household duties, and shopping  Neuro/Psych No problems in this area  Prior Studies Any changes since last visit?  no  Physicians involved in your care Any changes since last visit?  no   Family History  Problem Relation Age of Onset   Cancer Mother    Heart attack Father    Social History   Socioeconomic History   Marital status: Widowed    Spouse name: Not on file   Number of children: 2   Years of education: Not on file   Highest education level: Not on file  Occupational History   Not on file  Tobacco Use   Smoking status: Never   Smokeless tobacco: Never  Vaping Use   Vaping status: Never Used  Substance and Sexual Activity   Alcohol use: No   Drug use: No   Sexual activity: Not on file  Other Topics Concern   Not on file  Social History Narrative   Pt lives with son at home.    Social Drivers of Corporate investment banker Strain: Low Risk  (03/24/2020)   Received from [facility]   Overall Financial Resource Strain (CARDIA)    Difficulty of Paying Living Expenses: Not hard at all  Food  Insecurity: No Food Insecurity (04/06/2023)   Hunger Vital Sign    Worried About Running Out of Food in the Last Year: Never true    Ran Out of Food in the Last Year: Never true  Transportation Needs: No Transportation Needs (04/06/2023)   PRAPARE - Administrator, Civil Service (Medical): No    Lack of Transportation (Non-Medical): No  Physical Activity: Not on file  Stress: Not on file  Social Connections: Unknown (03/24/2020)   Received from [facility]   Social Connection and Isolation Panel [NHANES]    Frequency of Communication with Friends and Family: More than three times a week    Frequency of Social Gatherings with Friends and Family: More than three times a week    Attends Religious Services: Not on file    Active Member of Clubs or Organizations: Not on file    Attends Banker Meetings: Not on file    Marital Status: Not on file   Past Surgical History:  Procedure Laterality Date   ABDOMINAL HYSTERECTOMY     CARDIAC CATHETERIZATION N/A 03/23/2016   Procedure: Left Heart Cath and Coronary Angiography;  Surgeon: Millicent Ally, MD;  Location: [facility] IN[facility];  Service: Cardiovascular;  Laterality: N/A;   CHOLECYSTECTOMY  IR CT HEAD LTD  04/04/2023   IR PERCUTANEOUS ART THROMBECTOMY/INFUSION INTRACRANIAL INC DIAG ANGIO  04/04/2023   RADIOLOGY WITH ANESTHESIA N/A 04/04/2023   Procedure: IR WITH ANESTHESIA;  Surgeon: Radiologist, Medication, MD;  Location: [facility] OR;  Service: Radiology;  Laterality: N/A;   VARICOSE VEIN SURGERY     Past Medical History:  Diagnosis Date   Chest pain    Coronary artery disease    Hypertension    LBBB (left bundle branch block)    BP (!) 154/76 (BP Location: Right Arm, Patient Position: Sitting)   Pulse 60   Ht 5\' 5"  (1.651 m)   Wt 126 lb 12.8 oz (57.5 kg)   SpO2 99%   BMI 21.10 kg/m   Opioid Risk Score:   Fall Risk Score:  `1  Depression screen  [facility] 2/9     11/15/2023    1:31 PM 05/17/2023    2:31  PM  Depression screen PHQ 2/9  Decreased Interest 0 2  Down, Depressed, Hopeless 0 2  PHQ - 2 Score 0 4  Altered sleeping  3  Tired, decreased energy  2  Change in appetite  0  Feeling bad or failure about yourself   2  Trouble concentrating  3  Moving slowly or fidgety/restless  3  Suicidal thoughts  0  PHQ-9 Score  17  Difficult doing work/chores  Somewhat difficult     Review of Systems  Constitutional: Negative.   HENT: Negative.    Eyes: Negative.   Respiratory: Negative.    Cardiovascular: Negative.   Gastrointestinal: Negative.   Endocrine: Negative.   Genitourinary: Negative.   Musculoskeletal: Negative.   Skin: Negative.   Allergic/Immunologic: Negative.   Neurological:  Positive for weakness.  Hematological:  Bruises/bleeds easily.       Apixaban   Psychiatric/Behavioral:  Positive for dysphoric mood.   All other systems reviewed and are negative.      Objective:   Physical Exam Gen: no distress, normal appearing HEENT: oral mucosa pink and moist, NCAT Cardio: Reg rate Chest: normal effort, normal rate of breathing Abd: soft, non-distended Ext: no edema Psych: pleasant, normal affect Skin: intact Neuro: Alert and oriented x3, 4/5 strength right hand      Assessment & Plan:   1) CVA -continue Eliquis   2) Insomnia: -resolved -Try to go outside near sunrise -Get exercise during the day.  -Turn off all devices an hour before bedtime.  -Teas that can benefit: chamomile, valerian root, Brahmi (Bacopa) -Can consider over the counter melatonin, magnesium , and/or L-theanine. Melatonin is an anti-oxidant with multiple health benefits. Magnesium  is involved in greater than 300 enzymatic reactions in the body and most of us  are deficient as our soil is often depleted. There are 7 different types of magnesium - Bioptemizer's is a supplement with all 7 types, and each has unique benefits. Magnesium  can also help with constipation and anxiety.  -Pistachios  naturally increase the production of melatonin -Cozy Earth bamboo bed sheets are free from toxic chemicals.  -Tart cherry juice or a tart cherry supplement can improve sleep and soreness post-workout   3) Low back pain -continue lidocaine  patch  4) Atrial fibrillation: -continue Eliquis   5) HTN: -BP is 154/76 today.  Continue cozaar  -discussed that BP has been running well at home -Advised checking BP daily at home and logging results to bring into follow-up appointment with PCP and myself. -Reviewed BP meds today.  -Advised regarding healthy foods that can help lower blood pressure and provided with a  list: 1) citrus foods- high in vitamins and minerals 2) salmon and other fatty fish - reduces inflammation and oxylipins 3) swiss chard (leafy green)- high level of nitrates 4) pumpkin seeds- one of the best natural sources of magnesium  5) Beans and lentils- high in fiber, magnesium , and potassium 6) Berries- high in flavonoids 7) Amaranth (whole grain, can be cooked similarly to rice and oats)- high in magnesium  and fiber 8) Pistachios- even more effective at reducing BP than other nuts 9) Carrots- high in phenolic compounds that relax blood vessels and reduce inflammation 10) Celery- contain phthalides that relax tissues of arterial walls 11) Tomatoes- can also improve cholesterol and reduce risk of heart disease 12) Broccoli- good source of magnesium , calcium , and potassium 13) Greek yogurt: high in potassium and calcium  14) Herbs and spices: Celery seed, cilantro, saffron, lemongrass, black cumin, ginseng, cinnamon, cardamom, sweet basil, and ginger 15) Chia and flax seeds- also help to lower cholesterol and blood sugar 16) Beets- high levels of nitrates that relax blood vessels  17) spinach and bananas- high in potassium  -Provided lise of supplements that can help with hypertension:  1) magnesium : one high quality brand is Bioptemizers since it contains all 7 types of  magnesium , otherwise over the counter magnesium  gluconate 400mg  is a good option 2) B vitamins 3) vitamin D  4) potassium 5) CoQ10 6) L-arginine 7) Vitamin C 8) Beetroot -Educated that goal BP is 120/80. -Made goal to incorporate some of the above foods into diet.

## 2023-11-15 NOTE — Progress Notes (Deleted)
 Subjective:    Patient ID: [name], female    DOB: 06/06/1941, 83 y.o.   MRN: [id number]  HPI  Pain Inventory Average Pain 0 Pain Right Now 0 My pain is intermittent  LOCATION OF PAIN  Not experiencing any today.  BOWEL Number of stools per week: 4 Oral laxative use No  Type of laxative  n/a Enema or suppository use No  History of colostomy No  Incontinent No   BLADDER Normal In and out cath, frequency n/a Able to self cath No  Bladder incontinence No  Frequent urination No  Leakage with coughing No  Difficulty starting stream No  Incomplete bladder emptying No    Mobility walk without assistance  Function retired  Neuro/Psych No problems in this area  Prior Studies Any changes since last visit?  no  Physicians involved in your care Any changes since last visit?  no   Family History  Problem Relation Age of Onset   Cancer Mother    Heart attack Father    Social History   Socioeconomic History   Marital status: Widowed    Spouse name: Not on file   Number of children: 2   Years of education: Not on file   Highest education level: Not on file  Occupational History   Not on file  Tobacco Use   Smoking status: Never   Smokeless tobacco: Never  Vaping Use   Vaping status: Never Used  Substance and Sexual Activity   Alcohol use: No   Drug use: No   Sexual activity: Not on file  Other Topics Concern   Not on file  Social History Narrative   Pt lives with son at home.    Social Drivers of Corporate investment banker Strain: Low Risk  (03/24/2020)   Received from [facility]   Overall Financial Resource Strain (CARDIA)    Difficulty of Paying Living Expenses: Not hard at all  Food Insecurity: No Food Insecurity (04/06/2023)   Hunger Vital Sign    Worried About Running Out of Food in the Last Year: Never true    Ran Out of Food in the Last Year: Never true  Transportation Needs: No Transportation Needs (04/06/2023)   PRAPARE  - Administrator, Civil Service (Medical): No    Lack of Transportation (Non-Medical): No  Physical Activity: Not on file  Stress: Not on file  Social Connections: Unknown (03/24/2020)   Received from [facility]   Social Connection and Isolation Panel [NHANES]    Frequency of Communication with Friends and Family: More than three times a week    Frequency of Social Gatherings with Friends and Family: More than three times a week    Attends Religious Services: Not on file    Active Member of Clubs or Organizations: Not on file    Attends Banker Meetings: Not on file    Marital Status: Not on file   Past Surgical History:  Procedure Laterality Date   ABDOMINAL HYSTERECTOMY     CARDIAC CATHETERIZATION N/A 03/23/2016   Procedure: Left Heart Cath and Coronary Angiography;  Surgeon: Millicent Ally, MD;  Location: [facility];  Service: Cardiovascular;  Laterality: N/A;   CHOLECYSTECTOMY     IR CT HEAD LTD  04/04/2023   IR PERCUTANEOUS ART THROMBECTOMY/INFUSION INTRACRANIAL INC DIAG ANGIO  04/04/2023   RADIOLOGY WITH ANESTHESIA N/A 04/04/2023   Procedure: IR WITH ANESTHESIA;  Surgeon: Radiologist, Medication, MD;  Location: [facility] OR;  Service: Radiology;  Laterality: N/A;   VARICOSE VEIN SURGERY     Past Medical History:  Diagnosis Date   Chest pain    Coronary artery disease    Hypertension    LBBB (left bundle branch block)    BP (!) 154/76 (BP Location: Right Arm, Patient Position: Sitting)   Pulse 60   Ht 5\' 5"  (1.651 m)   Wt 126 lb 12.8 oz (57.5 kg)   SpO2 99%   BMI 21.10 kg/m   Opioid Risk Score:   Fall Risk Score:  `1  Depression screen [facility] 2/9     11/15/2023    1:31 PM 05/17/2023    2:31 PM  Depression screen PHQ 2/9  Decreased Interest 0 2  Down, Depressed, Hopeless 0 2  PHQ - 2 Score 0 4  Altered sleeping  3  Tired, decreased energy  2  Change in appetite  0  Feeling bad or failure about yourself   2  Trouble concentrating  3   Moving slowly or fidgety/restless  3  Suicidal thoughts  0  PHQ-9 Score  17  Difficult doing work/chores  Somewhat difficult     Review of Systems     Objective:   Physical Exam        Assessment & Plan:

## 2024-04-25 ENCOUNTER — Encounter: Payer: Self-pay | Admitting: Neurology

## 2024-04-25 ENCOUNTER — Ambulatory Visit: Payer: Medicare Other | Admitting: Neurology

## 2024-04-25 VITALS — BP 111/62 | HR 50 | Ht 65.0 in | Wt 135.6 lb

## 2024-04-25 DIAGNOSIS — I480 Paroxysmal atrial fibrillation: Secondary | ICD-10-CM

## 2024-04-25 DIAGNOSIS — Z8673 Personal history of transient ischemic attack (TIA), and cerebral infarction without residual deficits: Secondary | ICD-10-CM | POA: Diagnosis not present

## 2024-04-25 DIAGNOSIS — F0150 Vascular dementia without behavioral disturbance: Secondary | ICD-10-CM | POA: Diagnosis not present

## 2024-04-25 DIAGNOSIS — R413 Other amnesia: Secondary | ICD-10-CM | POA: Diagnosis not present

## 2024-04-25 MED ORDER — MEMANTINE HCL 28 X 5 MG & 21 X 10 MG PO TABS: ORAL_TABLET | ORAL | 12 refills | Status: AC

## 2024-04-25 NOTE — Progress Notes (Signed)
 [facility] [address] [place]. [place] [address] [phone]       OFFICE FOLLOW-UP NOTE  [name] Date of Birth:  26-Mar-1941 Medical Record Number:  [id number]   HPI: Initial office visit 08/05/2023 [name] is a 83 year old Caucasian lady seen today for initial office follow-up visit following hospital admission for stroke in October 2024.  She is accompanied by her grandson.  History is obtained from them and review of electronic medical records.  I personally reviewed pertinent available imaging films in  PACS.  She has past medical history of coronary artery disease, hypertension, left bundle branch block and chest pain.  She presented on 04/04/2023 to [facility], ED with acute onset of right-sided weakness, facial droop and aphasia.  She was last seen normal the night prior.  She was not candidate for TNK being outside the window.  CT angiogram of the head and neck revealed left M1 occlusion.  CT perfusion showed 0 core infarct but penumbra of 146 cc.  NIH stroke scale was 19.  She was transferred emergently to [facility] for emergent thrombectomy which was performed after discussion of risk-benefit with family.  Procedure successful with excellent revascularization.  She was found to have some mild confusion disorientation and right-sided weakness.  She was transferred to inpatient rehab where she did well and has returned home living with care of family.  She is able to ambulate independently but does have some mild right-sided weakness and drags her right leg.  She has had no falls or injuries.  She was found to be in new onset A-fib and has been started on Eliquis  which she is tolerating well with minor bruising and no bleeding.  Patient however has significant cognitive worsening from the stroke.  She is quite confused and disoriented.  She needs help with her medications and making meals for her.  Previously she was living independently but now  family has to observe her and provide help as needed.  She has had no recurrent stroke or TIA symptoms.  On cognitive testing today she did quite poorly and scored 12/30. Update 04/25/2024 : She returns for follow-up after last visit 8-1/2 months ago.  She she is accompanied by her family member.  History is obtained from them and review of electronic medical records and I personally reviewed pertinent available imaging films in PACS.  At last visit ordered MRI scan of the brain which was done on 08/27/2023 which shows encephalomalacia and old left MCA territory infarct with changes of small vessel disease and atrophy.  No acute abnormalities.  EEG on 08/23/2023 and showing no epileptiform activity and was normal.  Lab work on 08/05/2023 had shown normal vitamin B12, TSH, RPR and homocystine.  I had prescribed Namenda  which the patient had tolerated well and was taking 10 mg twice daily for 3 to 4 months till the prescription ran out.  Patient has been off Namenda  now for the last 2 months.  The patient's son states that they did notice initial improvement on Namenda  but it kind of plateaued out.  They have not noticed particular worsening after discontinuing the Namenda  either.  Patient continues to have significant short-term memory and cognitive difficulties.  Her remote memory seems fine.  She is mostly independent with self-care activities like bathing or using the restroom.  She is not allowed to cook.  She can be left alone for 4 to 5 hours a day.  She has 24-hour care at home  including at night.  She has not exhibited any unsafe behaviors.  She does not have any delusions or hallucinations.  She can walk independently with good balance and has had no falls.  She remains on Eliquis  which she is tolerating well without bruising or bleeding.  She has had no recurrent stroke or TIA symptoms.  Her blood pressure is under good control.  She is tolerating Lipitor  well without any side effects. ROS:   14 system  review of systems is positive for memory loss, confusion, disorientation, weakness, gait difficulty, bruising and all other systems negative  PMH:  Past Medical History:  Diagnosis Date   Chest pain    Coronary artery disease    Hypertension    LBBB (left bundle branch block)     Social History:  Social History   Socioeconomic History   Marital status: Widowed    Spouse name: Not on file   Number of children: 2   Years of education: Not on file   Highest education level: Not on file  Occupational History   Not on file  Tobacco Use   Smoking status: Never   Smokeless tobacco: Never  Vaping Use   Vaping status: Never Used  Substance and Sexual Activity   Alcohol use: No   Drug use: No   Sexual activity: Not on file  Other Topics Concern   Not on file  Social History Narrative   Pt lives with son at home.    Social Drivers of Corporate Investment Banker Strain: Low Risk (03/24/2020)   Received from [facility]   Overall Financial Resource Strain (CARDIA)    Difficulty of Paying Living Expenses: Not hard at all  Food Insecurity: No Food Insecurity (04/06/2023)   Hunger Vital Sign    Worried About Running Out of Food in the Last Year: Never true    Ran Out of Food in the Last Year: Never true  Transportation Needs: No Transportation Needs (04/06/2023)   PRAPARE - Administrator, Civil Service (Medical): No    Lack of Transportation (Non-Medical): No  Physical Activity: Not on file  Stress: Not on file  Social Connections: Unknown (03/24/2020)   Received from [facility]   Social Connection and Isolation Panel    In a typical week, how many times do you talk on the phone with family, friends, or neighbors?: More than three times a week    How often do you get together with friends or relatives?: More than three times a week    Attends Religious Services: Not on file    Active Member of Clubs or Organizations: Not on file    Attends Club or  Organization Meetings: Not on file    Marital Status: Not on file  Intimate Partner Violence: Not At Risk (04/06/2023)   Humiliation, Afraid, Rape, and Kick questionnaire    Fear of Current or Ex-Partner: No    Emotionally Abused: No    Physically Abused: No    Sexually Abused: No    Medications:   Current Outpatient Medications on File Prior to Visit  Medication Sig Dispense Refill   acetaminophen  (TYLENOL ) 325 MG tablet Take 2 tablets (650 mg total) by mouth every 6 (six) hours as needed for mild pain (pain score 1-3).     apixaban  (ELIQUIS ) 2.5 MG TABS tablet Take 1 tablet (2.5 mg total) by mouth 2 (two) times daily. 180 tablet 3   atorvastatin  (LIPITOR ) 80 MG tablet Take  1 tablet (80 mg total) by mouth daily. 90 tablet 3   Cholecalciferol  125 MCG (5000 UT) TABS Take 1 tablet (5,000 Units total) by mouth daily. 30 tablet 3   lidocaine  (LIDODERM ) 5 % Place 2 patches onto the skin daily at 8 pm. Remove & Discard patch within 12 hours or as directed by MD 30 patch 3   losartan  (COZAAR ) 50 MG tablet Take 1 tablet (50 mg total) by mouth daily. 90 tablet 3   melatonin 3 MG TABS tablet Take 1 tablet (3 mg total) by mouth at bedtime. 30 tablet 0   No current facility-administered medications on file prior to visit.    Allergies:   Allergies  Allergen Reactions   Codeine Nausea And Vomiting   Penicillins Nausea And Vomiting    Physical Exam General: Pleasant frail elderly Caucasian lady, seated, in no evident distress Head: head normocephalic and atraumatic.  Neck: supple with no carotid or supraclavicular bruits Cardiovascular: regular rate and rhythm, no murmurs Musculoskeletal: no deformity Skin:  no rash/petichiae Vascular:  Normal pulses all extremities Vitals:   04/25/24 1412  BP: 111/62  Pulse: (!) 50   Neurologic Exam Mental Status: Awake and fully alert. Oriented to place and time. Recent and remote memory poor. Attention span, concentration and fund of knowledge  diminished. Mood and affect appropriate.  Diminished recall 0/3.  Able to name only 5 animals which can walk on 4 legs.  Mini-Mental not done today.  Last visit score 12/30.  Clock drawing 2/4.   Cranial Nerves: Fundoscopic exam reveals sharp disc margins. Pupils equal, briskly reactive to light. Extraocular movements full without nystagmus. Visual fields full to confrontation. Hearing intact. Facial sensation intact.  Right lower facial weakness., tongue, palate moves normally and symmetrically.  Motor: Mild spastic right paresis with 4/5 strength in the right upper extremity and 4+/5 strength in right lower extremity.  Slight increased tone on the right.  Weakness of right grip intrinsic hand muscles.. Sensory.: intact to touch ,pinprick .position and vibratory sensation.  Coordination: Rapid alternating movements normal in all extremities. Finger-to-nose and heel-to-shin performed accurately bilaterally. Gait and Station: Arises from chair without difficulty. Stance is normal. Gait demonstrates slight dragging of the right leg.  Unable to walk tandem.   Reflexes: 1+ and asymmetric and brisker on the right. Toes downgoing.   NIHSS  6 Modified Rankin  3     08/05/2023    3:20 PM  MMSE - Mini Mental State Exam  Orientation to time 1  Orientation to Place 2  Registration 3  Attention/ Calculation 0  Recall 0  Language- name 2 objects 2  Language- repeat 1  Language- follow 3 step command 2  Language- read & follow direction 1  Write a sentence 0  Copy design 0  Total score 12      ASSESSMENT: 83 year old Caucasian lady with left MCA infarct due to left M1 occlusion in October 2024 due to new onset A-fib status post thrombectomy with good physical recovery but residual significant vascular dementia.     PLAN:I had a long d/w patient about her remote stroke, new diagnosis of atrial fibrillation and vascular dementia risk for recurrent stroke/TIAs, personally independently reviewed  imaging studies and stroke evaluation results and answered questions.Continue Eliquis  (apixaban ) daily  for secondary stroke prevention and maintain strict control of hypertension with blood pressure goal below 130/90, diabetes with hemoglobin A1c goal below 6.5% and lipids with LDL cholesterol goal below 70 mg/dL. I also advised the patient  to eat a healthy diet with plenty of whole grains, cereals, fruits and vegetables, exercise regularly and maintain ideal body weight .  Patient was given another prescription for Namenda  which she had inadvertently stopped and was tolerating well.  Followup in the future with my nurse practitioner in 6 months or call earlier if necessary.  Greater than 50% of time during this 35 minute visit was spent on counseling,explanation of diagnosis of embolic stroke, new onset atrial fibrillation, vascular dementia, planning of further management, discussion with patient and family and coordination of care Eather Popp, MD Note: This document was prepared with digital dictation and possible smart phrase technology. Any transcriptional errors that result from this process are unintentional

## 2024-04-25 NOTE — Patient Instructions (Signed)
 I had a long d/w patient about her remote stroke, new diagnosis of atrial fibrillation and vascular dementia risk for recurrent stroke/TIAs, personally independently reviewed imaging studies and stroke evaluation results and answered questions.Continue Eliquis  (apixaban ) daily  for secondary stroke prevention and maintain strict control of hypertension with blood pressure goal below 130/90, diabetes with hemoglobin A1c goal below 6.5% and lipids with LDL cholesterol goal below 70 mg/dL. I also advised the patient to eat a healthy diet with plenty of whole grains, cereals, fruits and vegetables, exercise regularly and maintain ideal body weight .  Patient was given another prescription for Namenda  which she had inadvertently stopped and was tolerating well.  Followup in the future with my nurse practitioner in 6 months or call earlier if necessary.  Dementia Caregiver Guide Dementia is a condition that affects the way the brain works. It often affects thinking and memory. A person with dementia may: Forget things. Have trouble talking or responding to your questions. Have trouble paying attention. Have trouble thinking clearly and making good decisions. Get lost or wander away from home or other places. Have big changes in their mood or emotions. They may: Feel very worried, nervous, or depressed. Have angry outbursts. Be suspicious or accuse you of things. Have childlike behavior and language. Taking care of someone with dementia can be a challenge. The tips below can help you care for the person. How to help manage lifestyle changes Dementia usually gets worse slowly over time. In the early stages, people with dementia can stay safe and take care of themselves with some help. In later stages, they need help with daily tasks like getting dressed, grooming, and going to the bathroom. Communicating When the person is talking and seems frustrated, make eye contact and hold the person's hand. Ask  questions that can be answered with a yes or no. Use simple words and a calm voice. Only give one direction at a time. Limit choices for the person. Too many choices can be stressful. Avoid correcting the person in a negative way. If the person can't find the right words, gently try to help. Preventing injury  Keep floors clear. Remove rugs, magazine racks, and floor lamps. Keep hallways well lit, especially at night. Put a handrail and nonslip mat in the bathtub or shower. Put childproof locks on cabinets that have dangerous items in them. These items include medicine, alcohol, guns, cleaning products, and sharp tools. For doors to the outside, put locks where the person can't see or reach them. This helps keep the person from going out of the house and getting lost. Be ready for emergencies. Keep a list of emergency phone numbers and addresses close by. Remove car keys and lock garage doors so the person doesn't try to drive. Have the person wear a bracelet that tracks where they are and shows that they're a person with memory loss. This should be worn at all times for safety. Helping with daily life  Keep the person on track with their daily routine. Try to identify areas where the person may need help. Be supportive, patient, calm, and encouraging. Gently remind the person that adjusting to changes takes time. Help with the tasks that the person has asked for help with. Keep the person involved in daily tasks and decisions as much as you can. Encourage conversation, but try not to get frustrated if the person struggles to find words or doesn't seem to appreciate your help. Other tips Think about any safety risks and take  steps to avoid them. Keep things organized: Organize medicines in a pill box for each day of the week. Keep a calendar in a central place. Use it to remind the person of health care visits or other activities. Create a plan to handle any legal or financial matters.  Get help from a professional if needed. Help make sure the person: Takes medicines only as told by their health care providers. Eats regular, healthy meals. They should also drink plenty of fluids. Goes to all scheduled health care appointments. Gets regular sleep. Taking care of yourself Being a caregiver for someone with dementia can be hard. You may feel stressed and have many other emotions. It's important to also take care of yourself. Here are some tips: Find out about services that can provide short-term care for the person. This is called respite care. It can allow you to take a break when you need one. Find healthy ways to deal with stress. Some ways include: Spending time with other people. Exercising. Meditating or doing deep breathing exercises. Take care of your own health by: Getting enough sleep. Eating healthy foods. Getting regular exercise. Join a support group with others who are caregivers. These groups can help you: Learn other ways to deal with stress. Share experiences with others. Get emotional comfort and support. Learn about caregiving as the disease gets worse. Find resources in your community. Where to find support: Many people and organizations offer support. These include: Support groups for people with dementia. Support groups for caregivers. Counselors or therapists. Home health care services. Adult day care centers. Where to find more information [employer]: westerntunes.it Family Caregiver Alliance: caregiver.org [employer] [place]: alzfdn.org Contact a health care provider if: The person's health is quickly getting worse. You're no longer able to care for the person. Caring for the person is affecting your physical and emotional health. You're feeling worried, nervous, or depressed about caring for the person. Get help right away if: You feel like the person may hurt themselves or others. The person has talked about  taking their own life. These symptoms may be an emergency. Take one of these steps right away: Go to your nearest emergency room. Call 911. Call the National Suicide Prevention Lifeline at 867-366-2113 or 988. Text the Crisis Text Line at 562-457-8089. This information is not intended to replace advice given to you by your health care provider. Make sure you discuss any questions you have with your health care provider. Document Revised: 09/11/2022 Document Reviewed: 09/11/2022 [employer]vier Patient Education  2024 [employer].

## 2024-05-10 ENCOUNTER — Other Ambulatory Visit: Payer: Self-pay | Admitting: Physical Medicine and Rehabilitation

## 2024-11-20 ENCOUNTER — Ambulatory Visit: Admitting: Adult Health
